# Patient Record
Sex: Male | Born: 1960
Health system: Southern US, Community
[De-identification: ages and names within clinical notes are randomized; demographics above are authoritative.]

## PROBLEM LIST (undated history)

## (undated) DIAGNOSIS — M545 Low back pain, unspecified: Secondary | ICD-10-CM

## (undated) DIAGNOSIS — J329 Chronic sinusitis, unspecified: Secondary | ICD-10-CM

## (undated) DIAGNOSIS — I4891 Unspecified atrial fibrillation: Secondary | ICD-10-CM

## (undated) DIAGNOSIS — K219 Gastro-esophageal reflux disease without esophagitis: Secondary | ICD-10-CM

## (undated) HISTORY — DX: Chronic sinusitis, unspecified: J32.9

## (undated) HISTORY — DX: Gastro-esophageal reflux disease without esophagitis: K21.9

## (undated) HISTORY — PX: EYE SURGERY: SHX253

## (undated) HISTORY — DX: Low back pain, unspecified: M54.50

## (undated) HISTORY — PX: CATARACT EXTRACTION: SUR2

## (undated) HISTORY — DX: Unspecified atrial fibrillation: I48.91

## (undated) HISTORY — DX: Low back pain: M54.5

---

## 1999-03-21 ENCOUNTER — Ambulatory Visit (HOSPITAL_BASED_OUTPATIENT_CLINIC_OR_DEPARTMENT_OTHER): Admission: RE | Admit: 1999-03-21 | Discharge: 1999-03-21 | Payer: Self-pay | Admitting: Orthopedic Surgery

## 2005-07-27 ENCOUNTER — Encounter: Admission: RE | Admit: 2005-07-27 | Discharge: 2005-07-27 | Payer: Self-pay | Admitting: Sports Medicine

## 2005-08-13 ENCOUNTER — Encounter: Admission: RE | Admit: 2005-08-13 | Discharge: 2005-08-13 | Payer: Self-pay | Admitting: Sports Medicine

## 2005-08-27 ENCOUNTER — Encounter: Admission: RE | Admit: 2005-08-27 | Discharge: 2005-08-27 | Payer: Self-pay | Admitting: Sports Medicine

## 2005-11-19 HISTORY — PX: BACK SURGERY: SHX140

## 2005-11-19 HISTORY — PX: LUMBAR DISC SURGERY: SHX700

## 2006-03-13 ENCOUNTER — Ambulatory Visit (HOSPITAL_COMMUNITY): Admission: RE | Admit: 2006-03-13 | Discharge: 2006-03-14 | Payer: Self-pay | Admitting: Neurosurgery

## 2006-09-27 ENCOUNTER — Ambulatory Visit (HOSPITAL_COMMUNITY): Admission: RE | Admit: 2006-09-27 | Discharge: 2006-09-27 | Payer: Self-pay | Admitting: Neurosurgery

## 2006-11-19 HISTORY — PX: NASAL SINUS SURGERY: SHX719

## 2007-03-21 ENCOUNTER — Ambulatory Visit: Payer: Self-pay | Admitting: Internal Medicine

## 2007-03-25 ENCOUNTER — Ambulatory Visit: Payer: Self-pay | Admitting: Internal Medicine

## 2007-03-25 LAB — CONVERTED CEMR LAB
Alkaline Phosphatase: 55 units/L (ref 39–117)
BUN: 16 mg/dL (ref 6–23)
CO2: 27 meq/L (ref 19–32)
Chloride: 108 meq/L (ref 96–112)
Creatinine, Ser: 1 mg/dL (ref 0.4–1.5)
Crystals: NEGATIVE
GFR calc Af Amer: 103 mL/min
GFR calc non Af Amer: 85 mL/min
Glucose, Bld: 96 mg/dL (ref 70–99)
Hemoglobin: 14.9 g/dL (ref 13.0–17.0)
Ketones, ur: NEGATIVE mg/dL
MCHC: 33.4 g/dL (ref 30.0–36.0)
Monocytes Relative: 7.7 % (ref 3.0–11.0)
Mucus, UA: NEGATIVE
Nitrite: NEGATIVE
Platelets: 231 10*3/uL (ref 150–400)
Potassium: 3.9 meq/L (ref 3.5–5.1)
RBC / HPF: NONE SEEN
Sed Rate: 10 mm/hr (ref 0–20)
Specific Gravity, Urine: >=1.03 (ref 1.000–1.03)
Total Protein: 6.6 g/dL (ref 6.0–8.3)

## 2007-04-22 ENCOUNTER — Ambulatory Visit: Payer: Self-pay | Admitting: Internal Medicine

## 2007-04-24 ENCOUNTER — Ambulatory Visit: Payer: Self-pay | Admitting: Cardiovascular Disease

## 2007-06-05 ENCOUNTER — Ambulatory Visit: Payer: Self-pay | Admitting: Internal Medicine

## 2007-06-18 ENCOUNTER — Ambulatory Visit: Payer: Self-pay | Admitting: Internal Medicine

## 2007-10-20 ENCOUNTER — Ambulatory Visit (HOSPITAL_BASED_OUTPATIENT_CLINIC_OR_DEPARTMENT_OTHER): Admission: RE | Admit: 2007-10-20 | Discharge: 2007-10-20 | Payer: Self-pay | Admitting: Otolaryngology

## 2007-10-20 ENCOUNTER — Encounter (INDEPENDENT_AMBULATORY_CARE_PROVIDER_SITE_OTHER): Payer: Self-pay | Admitting: Otolaryngology

## 2007-10-31 ENCOUNTER — Ambulatory Visit: Payer: Self-pay | Admitting: Internal Medicine

## 2007-10-31 DIAGNOSIS — M545 Low back pain, unspecified: Secondary | ICD-10-CM | POA: Insufficient documentation

## 2007-10-31 DIAGNOSIS — J309 Allergic rhinitis, unspecified: Secondary | ICD-10-CM | POA: Insufficient documentation

## 2007-10-31 DIAGNOSIS — J329 Chronic sinusitis, unspecified: Secondary | ICD-10-CM | POA: Insufficient documentation

## 2007-10-31 DIAGNOSIS — J301 Allergic rhinitis due to pollen: Secondary | ICD-10-CM | POA: Insufficient documentation

## 2008-04-29 ENCOUNTER — Ambulatory Visit: Payer: Self-pay | Admitting: Internal Medicine

## 2008-07-28 ENCOUNTER — Ambulatory Visit: Payer: Self-pay | Admitting: Internal Medicine

## 2008-11-26 ENCOUNTER — Encounter: Payer: Self-pay | Admitting: Internal Medicine

## 2008-12-07 ENCOUNTER — Ambulatory Visit: Payer: Self-pay | Admitting: Internal Medicine

## 2008-12-08 DIAGNOSIS — K219 Gastro-esophageal reflux disease without esophagitis: Secondary | ICD-10-CM | POA: Insufficient documentation

## 2009-09-01 ENCOUNTER — Ambulatory Visit: Payer: Self-pay | Admitting: Internal Medicine

## 2009-09-01 DIAGNOSIS — Z87891 Personal history of nicotine dependence: Secondary | ICD-10-CM | POA: Insufficient documentation

## 2009-09-01 DIAGNOSIS — M542 Cervicalgia: Secondary | ICD-10-CM | POA: Insufficient documentation

## 2010-01-14 ENCOUNTER — Ambulatory Visit: Payer: Self-pay | Admitting: Internal Medicine

## 2010-01-14 DIAGNOSIS — J069 Acute upper respiratory infection, unspecified: Secondary | ICD-10-CM | POA: Insufficient documentation

## 2010-01-14 DIAGNOSIS — J019 Acute sinusitis, unspecified: Secondary | ICD-10-CM | POA: Insufficient documentation

## 2010-02-10 ENCOUNTER — Ambulatory Visit: Payer: Self-pay | Admitting: Internal Medicine

## 2010-02-10 DIAGNOSIS — G5710 Meralgia paresthetica, unspecified lower limb: Secondary | ICD-10-CM | POA: Insufficient documentation

## 2010-03-23 ENCOUNTER — Ambulatory Visit: Payer: Self-pay | Admitting: Internal Medicine

## 2010-03-23 DIAGNOSIS — H9319 Tinnitus, unspecified ear: Secondary | ICD-10-CM | POA: Insufficient documentation

## 2010-04-28 ENCOUNTER — Ambulatory Visit: Payer: Self-pay | Admitting: Internal Medicine

## 2010-04-28 DIAGNOSIS — H6690 Otitis media, unspecified, unspecified ear: Secondary | ICD-10-CM | POA: Insufficient documentation

## 2010-10-25 ENCOUNTER — Ambulatory Visit: Payer: Self-pay | Admitting: Internal Medicine

## 2010-11-10 ENCOUNTER — Ambulatory Visit: Payer: Self-pay | Admitting: Internal Medicine

## 2010-11-10 DIAGNOSIS — R51 Headache: Secondary | ICD-10-CM | POA: Insufficient documentation

## 2010-11-10 DIAGNOSIS — R519 Headache, unspecified: Secondary | ICD-10-CM | POA: Insufficient documentation

## 2010-11-10 DIAGNOSIS — G43909 Migraine, unspecified, not intractable, without status migrainosus: Secondary | ICD-10-CM | POA: Insufficient documentation

## 2010-11-28 ENCOUNTER — Telehealth: Payer: Self-pay | Admitting: Internal Medicine

## 2010-11-29 ENCOUNTER — Ambulatory Visit
Admission: RE | Admit: 2010-11-29 | Discharge: 2010-11-29 | Payer: Self-pay | Source: Home / Self Care | Attending: Internal Medicine | Admitting: Internal Medicine

## 2010-11-29 ENCOUNTER — Other Ambulatory Visit: Payer: Self-pay | Admitting: Internal Medicine

## 2010-11-29 DIAGNOSIS — I889 Nonspecific lymphadenitis, unspecified: Secondary | ICD-10-CM | POA: Insufficient documentation

## 2010-11-29 LAB — CBC WITH DIFFERENTIAL/PLATELET
Basophils Absolute: 0.1 10*3/uL (ref 0.0–0.1)
Basophils Relative: 0.4 % (ref 0.0–3.0)
Eosinophils Absolute: 0.5 10*3/uL (ref 0.0–0.7)
Eosinophils Relative: 4 % (ref 0.0–5.0)
HCT: 47 % (ref 39.0–52.0)
Hemoglobin: 15.8 g/dL (ref 13.0–17.0)
Lymphocytes Relative: 24.6 % (ref 12.0–46.0)
Lymphs Abs: 3 10*3/uL (ref 0.7–4.0)
MCHC: 33.6 g/dL (ref 30.0–36.0)
MCV: 79 fl (ref 78.0–100.0)
Monocytes Absolute: 1.1 10*3/uL — ABNORMAL HIGH (ref 0.1–1.0)
Monocytes Relative: 9 % (ref 3.0–12.0)
Neutro Abs: 7.6 10*3/uL (ref 1.4–7.7)
Neutrophils Relative %: 62 % (ref 43.0–77.0)
Platelets: 263 10*3/uL (ref 150.0–400.0)
RBC: 5.95 Mil/uL — ABNORMAL HIGH (ref 4.22–5.81)
RDW: 14.5 % (ref 11.5–14.6)
WBC: 12.3 10*3/uL — ABNORMAL HIGH (ref 4.5–10.5)

## 2010-11-29 LAB — MONONUCLEOSIS SCREEN: Mono Screen: NEGATIVE

## 2010-11-29 LAB — SEDIMENTATION RATE: Sed Rate: 16 mm/hr (ref 0–22)

## 2010-12-08 ENCOUNTER — Ambulatory Visit
Admission: RE | Admit: 2010-12-08 | Discharge: 2010-12-08 | Payer: Self-pay | Source: Home / Self Care | Attending: Internal Medicine | Admitting: Internal Medicine

## 2010-12-19 NOTE — Assessment & Plan Note (Signed)
Summary: EAR ACHE W/PRESSURE-HEADACHE--STC   Vital Signs:  Patient profile:   50 year old male Height:      70 inches Weight:      216 pounds Temp:     97.6 degrees F oral Pulse rate:   88 / minute Resp:     24 per minute BP supine:   110 / 68  (left arm)  History of Present Illness: C/o L ear and L nose stopped up and hurt x 4 d  Allergies: 1)  * Seafood  Past History:  Past Medical History: Last updated: 03/23/2010 Sinusitis   Dr Pollyann Kennedy Allergic rhinitis Low back pain GERD Tinnitus L 2011  Social History: Last updated: 10/31/2007 Occupation: Academic librarian Married Former Smoker  Review of Systems       The patient complains of fever.  The patient denies chest pain, prolonged cough, and abdominal pain.    Physical Exam  Ears:  L TM red Nose:  no external deformity and no external erythema.  congested and clogged   tender frontal areas   mild face tenderness; no green d/c Mouth:  WNL Lungs:  Normal respiratory effort, chest expands symmetrically. Lungs are clear to auscultation, no crackles or wheezes.no dullness.   Heart:  Normal rate and regular rhythm. S1 and S2 normal without gallop, murmur, click, rub or other extra sounds.no lifts.   Abdomen:  Bowel sounds positive,abdomen soft and non-tender without masses, organomegaly or hernias noted.   Impression & Recommendations:  Problem # 1:  SINUSITIS - ACUTE-NOS (ICD-461.9) Assessment New  His updated medication list for this problem includes:    Fluticasone Propionate 50 Mcg/act Susp (Fluticasone propionate) .Marland Kitchen... 2 sprays each nostril once daily    Loratadine-pseudoephedrine 10-240 Mg Xr24h-tab (Loratadine-pseudoephedrine) .Marland Kitchen... 1 by mouth qam    Augmentin 875-125 Mg Tabs (Amoxicillin-pot clavulanate) .Marland Kitchen... 1 by mouth bid  Problem # 2:  OTITIS MEDIA (ICD-382.9) L Assessment: New  His updated medication list for this problem includes:    Nabumetone 500 Mg Tabs (Nabumetone) .Marland Kitchen... 1 two times a day as needed  Augmentin 875-125 Mg Tabs (Amoxicillin-pot clavulanate) .Marland Kitchen... 1 by mouth bid  Complete Medication List: 1)  Fluticasone Propionate 50 Mcg/act Susp (Fluticasone propionate) .... 2 sprays each nostril once daily 2)  Vitamin D3 1000 Unit Tabs (Cholecalciferol) .Marland Kitchen.. 1 qd 3)  Nabumetone 500 Mg Tabs (Nabumetone) .Marland Kitchen.. 1 two times a day as needed 4)  Ranitidine Hcl 300 Mg Tabs (Ranitidine hcl) .Marland Kitchen.. 1 po qd 5)  Metamucil 0.52 Gm Caps (Psyllium) .... 2 caps a day 6)  Loratadine-pseudoephedrine 10-240 Mg Xr24h-tab (Loratadine-pseudoephedrine) .Marland Kitchen.. 1 by mouth qam 7)  Hydroxyzine Hcl 25 Mg Tabs (Hydroxyzine hcl) .Marland Kitchen.. 1-2 tabs by mouth two times a day as needed for ringing in the ears 8)  Augmentin 875-125 Mg Tabs (Amoxicillin-pot clavulanate) .Marland Kitchen.. 1 by mouth bid 9)  Prednisone 10 Mg Tabs (Prednisone) .... Take 40mg  qd for 3 days, then 20 mg qd for 3 days, then 10mg  qd for 6 days, then stop. take pc.  Patient Instructions: 1)  Call if you are not better in a reasonable amount of time or if worse.  Prescriptions: PREDNISONE 10 MG TABS (PREDNISONE) Take 40mg  qd for 3 days, then 20 mg qd for 3 days, then 10mg  qd for 6 days, then stop. Take pc.  #24 x 1   Entered and Authorized by:   Tresa Garter MD   Signed by:   Tresa Garter MD on 04/28/2010   Method used:  Print then Give to Patient   RxID:   [id number] AUGMENTIN 875-125 MG TABS (AMOXICILLIN-POT CLAVULANATE) 1 by mouth bid  #20 x 1   Entered and Authorized by:   Tresa Garter MD   Signed by:   Tresa Garter MD on 04/28/2010   Method used:   Print then Give to Patient   RxID:   [id number]

## 2010-12-19 NOTE — Assessment & Plan Note (Signed)
Summary: 3 MO ROV /NWS #  RS'D PER PT/NWS   Vital Signs:  Patient profile:   50 year old male Weight:      214 pounds Temp:     97.4 degrees F oral Pulse rate:   69 / minute BP sitting:   90 / 70  (left arm)  Vitals Entered By: Tora Perches (February 10, 2010 9:20 AM) CC: f/u Is Patient Diabetic? No   CC:  f/u.  History of Present Illness: The patient presents with complaints of sneezing, cough, sinus congestion and drainge of several wks duration. Not better with OTC meds. C/o L groin and anterior thigh discomfort  x wks.  Preventive Screening-Counseling & Management  Alcohol-Tobacco     Smoking Status: quit  Current Medications (verified): 1)  Fluticasone Propionate 50 Mcg/act  Susp (Fluticasone Propionate) .... 2 Sprays Each Nostril Once Daily 2)  Fexofenadine Hcl 180 Mg Tabs (Fexofenadine Hcl) .Marland Kitchen.. 1 Once Daily As Needed Allergies 3)  Vitamin D3 1000 Unit  Tabs (Cholecalciferol) .Marland Kitchen.. 1 Qd 4)  Nabumetone 500 Mg Tabs (Nabumetone) .Marland Kitchen.. 1 Two Times A Day As Needed 5)  Ranitidine Hcl 300 Mg Tabs (Ranitidine Hcl) .Marland Kitchen.. 1 Po Qd 6)  Metamucil 0.52 Gm Caps (Psyllium) .... 2 Caps A Day  Allergies: 1)  * Seafood  Past History:  Past Medical History: Last updated: 12/07/2008 Sinusitis   Dr Pollyann Kennedy Allergic rhinitis Low back pain GERD  Social History: Last updated: 10/31/2007 Occupation: Academic librarian Married Former Smoker  Review of Systems       The patient complains of fever.  The patient denies dyspnea on exertion and abdominal pain.         LBP better  Physical Exam  General:  Well-developed,well-nourished,in no acute distress; alert,appropriate and cooperative throughout examination mild congestion  speaks fairly  good english   Ears:  R ear normal, L ear normal, and no external deformities.   Nose:  no external deformity and no external erythema.  congested and clogged   tender frontal areas   mild face tenderness; no green d/c Mouth:  WNL Neck:  No deformities,  masses, or tenderness noted. Lungs:  Normal respiratory effort, chest expands symmetrically. Lungs are clear to auscultation, no crackles or wheezes.no dullness.   Heart:  Normal rate and regular rhythm. S1 and S2 normal without gallop, murmur, click, rub or other extra sounds.no lifts.   Abdomen:  Bowel sounds positive,abdomen soft and non-tender without masses, organomegaly or hernias noted. Msk:  L anterior thigh w/oval area of decreased sensation Neurologic:  grossly normal  gait nl  non focal Skin:  turgor normal, color normal, and no ecchymoses.  no acute rashes   Psych:  Oriented X3, good eye contact, and not anxious appearing.     Impression & Recommendations:  Problem # 1:  ALLERGIC RHINITIS (ICD-477.9) Assessment Deteriorated  The following medications were removed from the medication list:    Fexofenadine Hcl 180 Mg Tabs (Fexofenadine hcl) .Marland Kitchen... 1 once daily as needed allergies His updated medication list for this problem includes:    Fluticasone Propionate 50 Mcg/act Susp (Fluticasone propionate) .Marland Kitchen... 2 sprays each nostril once daily  Orders: Depo- Medrol 40mg  (J1030) Depo- Medrol 80mg  (J1040) Admin of Therapeutic Inj  intramuscular or subcutaneous (16109)  Problem # 2:  LOW BACK PAIN (ICD-724.2) Assessment: Improved  His updated medication list for this problem includes:    Nabumetone 500 Mg Tabs (Nabumetone) .Marland Kitchen... 1 two times a day as needed  Problem # 3:  MERALGIA PARESTHETICA (ICD-355.1) L Assessment: New loosen the belt  Problem # 4:  SINUSITIS - ACUTE-NOS (ICD-461.9) Assessment: Improved  His updated medication list for this problem includes:    Fluticasone Propionate 50 Mcg/act Susp (Fluticasone propionate) .Marland Kitchen... 2 sprays each nostril once daily    Loratadine-pseudoephedrine 10-240 Mg Xr24h-tab (Loratadine-pseudoephedrine) .Marland Kitchen... 1 by mouth qam  Complete Medication List: 1)  Fluticasone Propionate 50 Mcg/act Susp (Fluticasone propionate) .... 2 sprays  each nostril once daily 2)  Vitamin D3 1000 Unit Tabs (Cholecalciferol) .Marland Kitchen.. 1 qd 3)  Nabumetone 500 Mg Tabs (Nabumetone) .Marland Kitchen.. 1 two times a day as needed 4)  Ranitidine Hcl 300 Mg Tabs (Ranitidine hcl) .Marland Kitchen.. 1 po qd 5)  Metamucil 0.52 Gm Caps (Psyllium) .... 2 caps a day 6)  Loratadine-pseudoephedrine 10-240 Mg Xr24h-tab (Loratadine-pseudoephedrine) .Marland Kitchen.. 1 by mouth qam 7)  Prednisone 10 Mg Tabs (Prednisone) .... Take 40mg  qd for 3 days, then 20 mg qd for 3 days, then 10mg  qd for 6 days, then stop. take pc.  Patient Instructions: 1)  Please schedule a follow-up appointment in 1 month. 2)  Loosen the belt 3)  Use the Sinus rinse as needed  Prescriptions: RANITIDINE HCL 300 MG TABS (RANITIDINE HCL) 1 po qd  #90 x 3   Entered and Authorized by:   Tresa Garter MD   Signed by:   Tresa Garter MD on 02/10/2010   Method used:   Print then Give to Patient   RxID:   [id number] NABUMETONE 500 MG TABS (NABUMETONE) 1 two times a day as needed  #60 x 6   Entered and Authorized by:   Tresa Garter MD   Signed by:   Tresa Garter MD on 02/10/2010   Method used:   Print then Give to Patient   RxID:   [id number] VITAMIN D3 1000 UNIT  TABS (CHOLECALCIFEROL) 1 qd  #100 x 3   Entered and Authorized by:   Tresa Garter MD   Signed by:   Tresa Garter MD on 02/10/2010   Method used:   Print then Give to Patient   RxID:   [id number] FLUTICASONE PROPIONATE 50 MCG/ACT  SUSP (FLUTICASONE PROPIONATE) 2 sprays each nostril once daily  #1 x 12   Entered and Authorized by:   Tresa Garter MD   Signed by:   Tresa Garter MD on 02/10/2010   Method used:   Print then Give to Patient   RxID:   [id number] PREDNISONE 10 MG TABS (PREDNISONE) Take 40mg  qd for 3 days, then 20 mg qd for 3 days, then 10mg  qd for 6 days, then stop. Take pc.  #24 x 1   Entered and Authorized by:   Tresa Garter MD   Signed by:   Tresa Garter MD on  02/10/2010   Method used:   Print then Give to Patient   RxID:   [id number] LORATADINE-PSEUDOEPHEDRINE 10-240 MG XR24H-TAB (LORATADINE-PSEUDOEPHEDRINE) 1 by mouth qam  #30 x 12   Entered and Authorized by:   Tresa Garter MD   Signed by:   Tresa Garter MD on 02/10/2010   Method used:   Print then Give to Patient   RxID:   [id number]    Medication Administration  Injection # 1:    Medication: Depo- Medrol 40mg     Diagnosis: ALLERGIC RHINITIS (ICD-477.9)    Route: IM    Site: LUOQ gluteus    Exp Date: 09/2012    Lot #:  [id number]    Mfr: [employer]    Patient tolerated injection without complications    Given by: Tora Perches (February 10, 2010 10:04 AM)  Injection # 2:    Medication: Depo- Medrol 80mg     Diagnosis: ALLERGIC RHINITIS (ICD-477.9)    Route: IM    Site: LUOQ gluteus    Exp Date: 09/2012    Lot #: [id number]    Mfr: [employer]    Patient tolerated injection without complications    Given by: Tora Perches (February 10, 2010 10:05 AM)  Orders Added: 1)  Est. Patient Level IV [29562] 2)  Depo- Medrol 40mg  [J1030] 3)  Depo- Medrol 80mg  [J1040] 4)  Admin of Therapeutic Inj  intramuscular or subcutaneous [13086]

## 2010-12-19 NOTE — Assessment & Plan Note (Signed)
Summary: 1 MO ROV /NWS  #   Vital Signs:  Patient profile:   50 year old male Height:      70 inches Weight:      212.38 pounds BMI:     30.58 O2 Sat:      96 % on Room air Temp:     97.4 degrees F oral Pulse rate:   63 / minute BP sitting:   100 / 60  (left arm) Cuff size:   large  Vitals Entered By: Lucious Groves (Mar 23, 2010 9:13 AM)  O2 Flow:  Room air CC: 1 mo rtn ov./kb Is Patient Diabetic? No Pain Assessment Patient in pain? no        CC:  1 mo rtn ov./kb.  History of Present Illness: The patient presents for a follow up of sinusitis - better. F/u LBP, L thigh numbness - better. C/o tinnitus at times; not dizzy...   Current Medications (verified): 1)  Fluticasone Propionate 50 Mcg/act  Susp (Fluticasone Propionate) .... 2 Sprays Each Nostril Once Daily 2)  Vitamin D3 1000 Unit  Tabs (Cholecalciferol) .Marland Kitchen.. 1 Qd 3)  Nabumetone 500 Mg Tabs (Nabumetone) .Marland Kitchen.. 1 Two Times A Day As Needed 4)  Ranitidine Hcl 300 Mg Tabs (Ranitidine Hcl) .Marland Kitchen.. 1 Po Qd 5)  Metamucil 0.52 Gm Caps (Psyllium) .... 2 Caps A Day 6)  Loratadine-Pseudoephedrine 10-240 Mg Xr24h-Tab (Loratadine-Pseudoephedrine) .Marland Kitchen.. 1 By Mouth Qam 7)  Prednisone 10 Mg Tabs (Prednisone) .... Take 40mg  Qd For 3 Days, Then 20 Mg Qd For 3 Days, Then 10mg  Qd For 6 Days, Then Stop. Take Pc.  Allergies (verified): 1)  * Seafood  Past History:  Social History: Last updated: 10/31/2007 Occupation: Academic librarian Married Former Smoker  Past Medical History: Sinusitis   Dr Pollyann Kennedy Allergic rhinitis Low back pain GERD Tinnitus L 2011  Family History: Reviewed history from 10/31/2007 and no changes required. Unremarkable  Social History: Reviewed history from 10/31/2007 and no changes required. Occupation: Academic librarian Married Former Smoker  Physical Exam  General:  Well-developed,well-nourished,in no acute distress; alert,appropriate and cooperative throughout examination mild congestion  speaks fairly  good english    Ears:  R ear normal, L ear normal, and no external deformities.   Nose:  no external deformity and no external erythema.  congested and clogged   tender frontal areas   mild face tenderness; no green d/c Mouth:  WNL Lungs:  Normal respiratory effort, chest expands symmetrically. Lungs are clear to auscultation, no crackles or wheezes.no dullness.   Heart:  Normal rate and regular rhythm. S1 and S2 normal without gallop, murmur, click, rub or other extra sounds.no lifts.   Abdomen:  Bowel sounds positive,abdomen soft and non-tender without masses, organomegaly or hernias noted. Msk:  L anterior thigh w/oval area of decreased sensation resolved Neurologic:  grossly normal  gait nl  non focal Skin:  turgor normal, color normal, and no ecchymoses.  no acute rashes   Psych:  Oriented X3, good eye contact, and not anxious appearing.     Impression & Recommendations:  Problem # 1:  TINNITUS (ICD-388.30) L Assessment New Hydroxyzine Refused ENT consult Hold Relafen  Problem # 2:  MERALGIA PARESTHETICA (ICD-355.1) Assessment: Improved Resolving  Problem # 3:  LOW BACK PAIN (ICD-724.2) Assessment: Improved  His updated medication list for this problem includes:    Nabumetone 500 Mg Tabs (Nabumetone) .Marland Kitchen... 1 two times a day as needed  Problem # 4:  SINUSITIS, CHRONIC (ICD-473.9) Assessment: Improved  His updated  medication list for this problem includes:    Fluticasone Propionate 50 Mcg/act Susp (Fluticasone propionate) .Marland Kitchen... 2 sprays each nostril once daily    Loratadine-pseudoephedrine 10-240 Mg Xr24h-tab (Loratadine-pseudoephedrine) .Marland Kitchen... 1 by mouth qam  Complete Medication List: 1)  Fluticasone Propionate 50 Mcg/act Susp (Fluticasone propionate) .... 2 sprays each nostril once daily 2)  Vitamin D3 1000 Unit Tabs (Cholecalciferol) .Marland Kitchen.. 1 qd 3)  Nabumetone 500 Mg Tabs (Nabumetone) .Marland Kitchen.. 1 two times a day as needed 4)  Ranitidine Hcl 300 Mg Tabs (Ranitidine hcl) .Marland Kitchen.. 1 po qd 5)   Metamucil 0.52 Gm Caps (Psyllium) .... 2 caps a day 6)  Loratadine-pseudoephedrine 10-240 Mg Xr24h-tab (Loratadine-pseudoephedrine) .Marland Kitchen.. 1 by mouth qam 7)  Hydroxyzine Hcl 25 Mg Tabs (Hydroxyzine hcl) .Marland Kitchen.. 1-2 tabs by mouth two times a day as needed for ringing in the ears  Patient Instructions: 1)  Loose 10 lbs. Stretch! 2)  Do not take Nabumetone x 7-10 days to see if ringing in the ear is better 3)  Please schedule a follow-up appointment in 4 months. Prescriptions: HYDROXYZINE HCL 25 MG TABS (HYDROXYZINE HCL) 1-2 tabs by mouth two times a day as needed for ringing in the ears  #60 x 3   Entered and Authorized by:   Tresa Garter MD   Signed by:   Tresa Garter MD on 03/23/2010   Method used:   Print then Give to Patient   RxID:   [id number]

## 2010-12-19 NOTE — Assessment & Plan Note (Signed)
Summary: 4 mos f/u //#/cd   RS'D PER PT/NWS   Vital Signs:  Patient profile:   50 year old male Height:      70 inches Weight:      211 pounds BMI:     30.38 Temp:     98.0 degrees F oral Pulse rate:   76 / minute Pulse rhythm:   regular Resp:     16 per minute BP sitting:   108 / 80  (left arm) Cuff size:   regular  Vitals Entered By: Lanier Prude, CMA(AAMA) (October 25, 2010 9:20 AM) CC: 4 mo f/u c/o dizziness, bilateral ear pressure, HA Is Patient Diabetic? No Comments pt states he hears buzzing sound   CC:  4 mo f/u c/o dizziness, bilateral ear pressure, and HA.  History of Present Illness: The patient presents with complaints of sore throat, fever,  sinus congestion and drainge of several days duration. Not better with OTC meds.Ears are popping.  The mucus is colored.   Current Medications (verified): 1)  Fluticasone Propionate 50 Mcg/act  Susp (Fluticasone Propionate) .... 2 Sprays Each Nostril Once Daily 2)  Vitamin D3 1000 Unit  Tabs (Cholecalciferol) .Marland Kitchen.. 1 Qd 3)  Nabumetone 500 Mg Tabs (Nabumetone) .Marland Kitchen.. 1 Two Times A Day As Needed 4)  Ranitidine Hcl 300 Mg Tabs (Ranitidine Hcl) .Marland Kitchen.. 1 Po Qd 5)  Metamucil 0.52 Gm Caps (Psyllium) .... 2 Caps A Day 6)  Loratadine-Pseudoephedrine 10-240 Mg Xr24h-Tab (Loratadine-Pseudoephedrine) .Marland Kitchen.. 1 By Mouth Qam 7)  Hydroxyzine Hcl 25 Mg Tabs (Hydroxyzine Hcl) .Marland Kitchen.. 1-2 Tabs By Mouth Two Times A Day As Needed For Ringing in The Ears 8)  Prednisone 10 Mg Tabs (Prednisone) .... Take 40mg  Qd For 3 Days, Then 20 Mg Qd For 3 Days, Then 10mg  Qd For 6 Days, Then Stop. Take Pc.  Allergies (verified): 1)  * Seafood  Physical Exam  General:  Well-developed,well-nourished,in no acute distress; alert,appropriate and cooperative throughout examination mild congestion  speaks fairly  good english   Head:  normocephalic and atraumatic.   Ears:  B TM  are red Nose:  no external deformity and no external erythema.  congested and clogged   tender  frontal areas   mild face tenderness; no green d/c Mouth:  WNL Neck:  No deformities, masses, or tenderness noted. Lungs:  Normal respiratory effort, chest expands symmetrically. Lungs are clear to auscultation, no crackles or wheezes.no dullness.   Heart:  Normal rate and regular rhythm. S1 and S2 normal without gallop, murmur, click, rub or other extra sounds.no lifts.   Abdomen:  Bowel sounds positive,abdomen soft and non-tender without masses, organomegaly or hernias noted. Msk:  WNL Neurologic:  grossly normal  gait nl  non focal Skin:  turgor normal, color normal, and no ecchymoses.  no acute rashes   Psych:  Oriented X3, good eye contact, and not anxious appearing.     Impression & Recommendations:  Problem # 1:  OTITIS MEDIA (ICD-382.9) Assessment New  Problem # 2:  SINUSITIS - ACUTE-NOS (ICD-461.9) Assessment: New  His updated medication list for this problem includes:    Fluticasone Propionate 50 Mcg/act Susp (Fluticasone propionate) .Marland Kitchen... 2 sprays each nostril once daily    Loratadine-pseudoephedrine 10-240 Mg Xr24h-tab (Loratadine-pseudoephedrine) .Marland Kitchen... 1 by mouth qam    Augmentin 875-125 Mg Tabs (Amoxicillin-pot clavulanate) .Marland Kitchen... 1 by mouth bid  Complete Medication List: 1)  Fluticasone Propionate 50 Mcg/act Susp (Fluticasone propionate) .... 2 sprays each nostril once daily 2)  Vitamin D3 1000  Unit Tabs (Cholecalciferol) .Marland Kitchen.. 1 qd 3)  Nabumetone 500 Mg Tabs (Nabumetone) .Marland Kitchen.. 1 two times a day as needed 4)  Ranitidine Hcl 300 Mg Tabs (Ranitidine hcl) .Marland Kitchen.. 1 po qd 5)  Metamucil 0.52 Gm Caps (Psyllium) .... 2 caps a day 6)  Loratadine-pseudoephedrine 10-240 Mg Xr24h-tab (Loratadine-pseudoephedrine) .Marland Kitchen.. 1 by mouth qam 7)  Hydroxyzine Hcl 25 Mg Tabs (Hydroxyzine hcl) .Marland Kitchen.. 1-2 tabs by mouth two times a day as needed for ringing in the ears 8)  Prednisone 10 Mg Tabs (Prednisone) .... Take 40mg  qd for 3 days, then 20 mg qd for 3 days, then 10mg  qd for 6 days, then stop. take  pc. 9)  Augmentin 875-125 Mg Tabs (Amoxicillin-pot clavulanate) .Marland Kitchen.. 1 by mouth bid  Patient Instructions: 1)  Call if you are not better in a reasonable amount of time or if worse.  Prescriptions: PREDNISONE 10 MG TABS (PREDNISONE) Take 40mg  qd for 3 days, then 20 mg qd for 3 days, then 10mg  qd for 6 days, then stop. Take pc.  #24 x 1   Entered and Authorized by:   Tresa Garter MD   Signed by:   Tresa Garter MD on 10/25/2010   Method used:   Electronically to        [facility]* (retail)       [address]       [place], [place]  [id number]       Ph: [phone]       Fax: [phone]   RxID:   [id number] AUGMENTIN 875-125 MG TABS (AMOXICILLIN-POT CLAVULANATE) 1 by mouth bid  #20 x 0   Entered and Authorized by:   Tresa Garter MD   Signed by:   Tresa Garter MD on 10/25/2010   Method used:   Electronically to        [facility]* (retail)       [address]       [place], [place]  [id number]       Ph: [phone]       Fax: [phone]   RxID:   [id number]    Orders Added: 1)  Est. Patient Level III [30160]

## 2010-12-19 NOTE — Assessment & Plan Note (Signed)
Summary: DR AVP PT-DIZZY-HEAT IN MOUTH-STC   Vital Signs:  Patient profile:   50 year old male Weight:      212 pounds O2 Sat:      96 % on Room air Temp:     98.1 degrees F Pulse rate:   61 / minute BP sitting:   108 / 68  (right arm) Cuff size:   large  Vitals Entered By: Lamar Sprinkles, CMA (January 14, 2010 9:39 AM)  O2 Flow:  Room air CC: Pt brought in list of symptoms that someone helped him write: Sores in mouth, h/a, tired, dizzy, nasal drainage, ears feel clogged, pain in joints    History of Present Illness: Joe Hawkins comes  in today for  Sat clinic   fo above symptom . days of above signs  but no fever some achiness.  HA over frontal area and nose congestion. NO cp of sob. some dizziness and ringing in right ear. No falling  focal weakness or numbness.  Hs of recurrent sinsusitis and sinus surgery.  ? if tried medication and is on his flonase .   Preventive Screening-Counseling & Management  Alcohol-Tobacco     Smoking Status: quit  Current Medications (verified): 1)  Fluticasone Propionate 50 Mcg/act  Susp (Fluticasone Propionate) .... 2 Sprays Each Nostril Once Daily 2)  Fexofenadine Hcl 180 Mg Tabs (Fexofenadine Hcl) .Marland Kitchen.. 1 Once Daily As Needed Allergies 3)  Vitamin D3 1000 Unit  Tabs (Cholecalciferol) .Marland Kitchen.. 1 Qd 4)  Nabumetone 500 Mg Tabs (Nabumetone) .Marland Kitchen.. 1 Two Times A Day As Needed 5)  Ranitidine Hcl 300 Mg Tabs (Ranitidine Hcl) .Marland Kitchen.. 1 Po Qd 6)  Metamucil 0.52 Gm Caps (Psyllium) .... 2 Caps A Day  Allergies (verified): 1)  * Seafood  Past History:  Past medical, surgical, family and social histories (including risk factors) reviewed for relevance to current acute and chronic problems.  Past Medical History: Reviewed history from 12/07/2008 and no changes required. Sinusitis   Dr Pollyann Kennedy Allergic rhinitis Low back pain GERD  Past Surgical History: Reviewed history from 10/31/2007 and no changes required. Sinus surgery 2008  Family  History: Reviewed history from 10/31/2007 and no changes required. Unremarkable  Social History: Reviewed history from 10/31/2007 and no changes required. Occupation: Academic librarian Married Former Smoker  Review of Systems       The patient complains of fever and headaches.  The patient denies anorexia, chest pain, syncope, dyspnea on exertion, peripheral edema, prolonged cough, hemoptysis, abdominal pain, abnormal bleeding, enlarged lymph nodes, and angioedema.    Physical Exam  General:  Well-developed,well-nourished,in no acute distress; alert,appropriate and cooperative throughout examination mild congestion  speaks fairly  good english   Head:  normocephalic and atraumatic.   Eyes:  vision grossly intact, pupils equal, and pupils round.   no redness or diacharge Ears:  R ear normal, L ear normal, and no external deformities.   Nose:  no external deformity and no external erythema.  congested and clogged   tender frontal areas   midl face tenderness Mouth:  milderythema no lesions Neck:  No deformities, masses, or tenderness noted. Lungs:  Normal respiratory effort, chest expands symmetrically. Lungs are clear to auscultation, no crackles or wheezes.no dullness.   Heart:  Normal rate and regular rhythm. S1 and S2 normal without gallop, murmur, click, rub or other extra sounds.no lifts.   Pulses:  nl cap refill  Neurologic:  grossly normal  gait nl  non focal Skin:  turgor normal, color normal, and  no ecchymoses.  no acute rashes   Cervical Nodes:  No lymphadenopathy noted Psych:  Oriented X3, good eye contact, and not anxious appearing.     Impression & Recommendations:  Problem # 1:  URI (ICD-465.9) onset seem viral but has sig sinus pressure signs and dizzy feeling.   NO alarm features in hx of exam otherwise. His updated medication list for this problem includes:    Fexofenadine Hcl 180 Mg Tabs (Fexofenadine hcl) .Marland Kitchen... 1 once daily as needed allergies    Nabumetone 500 Mg Tabs  (Nabumetone) .Marland Kitchen... 1 two times a day as needed  Problem # 2:  SINUSITIS - ACUTE-NOS (ICD-461.9)   ? acute on chronic ?    His updated medication list for this problem includes:    Fluticasone Propionate 50 Mcg/act Susp (Fluticasone propionate) .Marland Kitchen... 2 sprays each nostril once daily    Amoxicillin-pot Clavulanate 875-125 Mg Tabs (Amoxicillin-pot clavulanate)  Problem # 3:  SINUSITIS, CHRONIC (ICD-473.9)  His updated medication list for this problem includes:    Fluticasone Propionate 50 Mcg/act Susp (Fluticasone propionate) .Marland Kitchen... 2 sprays each nostril once daily    Amoxicillin-pot Clavulanate 875-125 Mg Tabs (Amoxicillin-pot clavulanate)  Problem # 4:  ALLERGIC RHINITIS (ICD-477.9)  His updated medication list for this problem includes:    Fluticasone Propionate 50 Mcg/act Susp (Fluticasone propionate) .Marland Kitchen... 2 sprays each nostril once daily    Fexofenadine Hcl 180 Mg Tabs (Fexofenadine hcl) .Marland Kitchen... 1 once daily as needed allergies  Complete Medication List: 1)  Fluticasone Propionate 50 Mcg/act Susp (Fluticasone propionate) .... 2 sprays each nostril once daily 2)  Fexofenadine Hcl 180 Mg Tabs (Fexofenadine hcl) .Marland Kitchen.. 1 once daily as needed allergies 3)  Vitamin D3 1000 Unit Tabs (Cholecalciferol) .Marland Kitchen.. 1 qd 4)  Nabumetone 500 Mg Tabs (Nabumetone) .Marland Kitchen.. 1 two times a day as needed 5)  Ranitidine Hcl 300 Mg Tabs (Ranitidine hcl) .Marland Kitchen.. 1 po qd 6)  Metamucil 0.52 Gm Caps (Psyllium) .... 2 caps a day 7)  Amoxicillin-pot Clavulanate 875-125 Mg Tabs (Amoxicillin-pot clavulanate)  Patient Instructions: 1)  I think you have a viral respiratory infection which should resolve on its own in another 5 days or so. 2)  However you also have a sinusitis  3)  Acute sinusitis symptoms for less than 10 days are not helped by antibiotics. Use warm moist compresses, and over the counter decongestants( only as directed).  if pain not improving in 2-3 days can add antibiotic to help.  4)  call if fever over  100.3 or shortness of breath. Prescriptions: AMOXICILLIN-POT CLAVULANATE 875-125 MG TABS (AMOXICILLIN-POT CLAVULANATE)   #20 x 0   Entered and Authorized by:   Madelin Headings MD   Signed by:   Madelin Headings MD on 01/14/2010   Method used:   Print then Give to Patient   RxID:   [id number]

## 2010-12-21 NOTE — Assessment & Plan Note (Signed)
Summary: sinus congestion/Sd   Vital Signs:  Patient profile:   50 year old male Height:      70 inches Weight:      214 pounds BMI:     30.82 Temp:     97.9 degrees F oral Pulse rate:   64 / minute Pulse rhythm:   regular Resp:     16 per minute BP sitting:   120 / 80  (left arm) Cuff size:   regular  Vitals Entered By: Lanier Prude, CMA(AAMA) (November 29, 2010 1:37 PM) CC: pain in Rt side jaw/neck, has "knot there"X 3 days, Rt ear pain, trouble swallowing Is Patient Diabetic? No   CC:  pain in Rt side jaw/neck, has "knot there"X 3 days, Rt ear pain, and trouble swallowing.  History of Present Illness: C/o painful swelling under R jaw x 3 day, fever - he could not sleep. It was hard to swallow  Current Medications (verified): 1)  Fluticasone Propionate 50 Mcg/act  Susp (Fluticasone Propionate) .... 2 Sprays Each Nostril Once Daily 2)  Vitamin D3 1000 Unit  Tabs (Cholecalciferol) .Marland Kitchen.. 1 Qd 3)  Nabumetone 500 Mg Tabs (Nabumetone) .Marland Kitchen.. 1 Two Times A Day As Needed 4)  Ranitidine Hcl 300 Mg Tabs (Ranitidine Hcl) .Marland Kitchen.. 1 Po Qd 5)  Metamucil 0.52 Gm Caps (Psyllium) .... 2 Caps A Day 6)  Loratadine-Pseudoephedrine 10-240 Mg Xr24h-Tab (Loratadine-Pseudoephedrine) .Marland Kitchen.. 1 By Mouth Qam 7)  Hydroxyzine Hcl 25 Mg Tabs (Hydroxyzine Hcl) .Marland Kitchen.. 1-2 Tabs By Mouth Two Times A Day As Needed For Ringing in The Ears 8)  Prednisone 10 Mg Tabs (Prednisone) .... Take 40mg  Qd For 3 Days, Then 20 Mg Qd For 3 Days, Then 10mg  Qd For 6 Days, Then Stop. Take Pc. 9)  Hydrocodone-Acetaminophen 5-325 Mg Tabs (Hydrocodone-Acetaminophen) .Marland Kitchen.. 1-2 By Mouth Two Times A Day As Needed Pain 10)  Maxalt-Mlt 10 Mg Tbdp (Rizatriptan Benzoate) .Marland Kitchen.. 1 By Mouth Once Daily As Needed Migraine  Allergies (verified): 1)  * Seafood  Past History:  Past Medical History: Last updated: 03/23/2010 Sinusitis   Dr Pollyann Kennedy Allergic rhinitis Low back pain GERD Tinnitus L 2011  Social History: Last updated:  10/31/2007 Occupation: Academic librarian Married Former Smoker  Review of Systems       The patient complains of fever.  The patient denies chest pain, dyspnea on exertion, prolonged cough, hemoptysis, and severe indigestion/heartburn.    Physical Exam  General:  Well-developed,overweight-appearing.   Ears:  External ear exam shows no significant lesions or deformities.  Otoscopic examination reveals clear canals, tympanic membranes are intact bilaterally without bulging, retraction, inflammation or discharge. Hearing is grossly normal bilaterally. Nose:  External nasal examination shows no deformity or inflammation. Nasal mucosa are pink and moist without lesions or exudates. Mouth:  Oral mucosa and oropharynx without lesions or exudates.  Teeth in good repair. Neck:  Large tender LN at  R jaw angle  Lungs:  Normal respiratory effort, chest expands symmetrically. Lungs are clear to auscultation, no crackles or wheezes.no dullness.   Heart:  Normal rate and regular rhythm. S1 and S2 normal without gallop, murmur, click, rub or other extra sounds.no lifts.   Abdomen:  Bowel sounds positive,abdomen soft and non-tender without masses, organomegaly or hernias noted. Skin:  Intact without suspicious lesions or rashes Psych:  Oriented X3, good eye contact, and not anxious appearing.     Impression & Recommendations:  Problem # 1:  LYMPHADENITIS (ICD-289.3) Assessment New Clinical bedside ultrasound was performed  Orders: Rocephin  250mg  (  Y8657) See Meds See "Patient Instructions".  Admin of Therapeutic Inj  intramuscular or subcutaneous (84696) TLB-CBC Platelet - w/Differential (85025-CBCD) TLB-Sedimentation Rate (ESR) (85652-ESR) TLB-Mono Test (Monospot) (29528-UXLK)  Sonography Report: Clinical bedside ultrasound was performed using Terason 3000 machine with a linear transducer.  A diffusely enlarged LN was observed in the area of R jaw angle. It was larger than transducer field. Images  saved  Complete Medication List: 1)  Fluticasone Propionate 50 Mcg/act Susp (Fluticasone propionate) .... 2 sprays each nostril once daily 2)  Vitamin D3 1000 Unit Tabs (Cholecalciferol) .Marland Kitchen.. 1 qd 3)  Nabumetone 500 Mg Tabs (Nabumetone) .Marland Kitchen.. 1 two times a day as needed 4)  Ranitidine Hcl 300 Mg Tabs (Ranitidine hcl) .Marland Kitchen.. 1 po qd 5)  Metamucil 0.52 Gm Caps (Psyllium) .... 2 caps a day 6)  Loratadine-pseudoephedrine 10-240 Mg Xr24h-tab (Loratadine-pseudoephedrine) .Marland Kitchen.. 1 by mouth qam 7)  Hydroxyzine Hcl 25 Mg Tabs (Hydroxyzine hcl) .Marland Kitchen.. 1-2 tabs by mouth two times a day as needed for ringing in the ears 8)  Prednisone 10 Mg Tabs (Prednisone) .... Take 40mg  qd for 3 days, then 20 mg qd for 3 days, then 10mg  qd for 6 days, then stop. take pc. 9)  Hydrocodone-acetaminophen 5-325 Mg Tabs (Hydrocodone-acetaminophen) .Marland Kitchen.. 1-2 by mouth two times a day as needed pain 10)  Maxalt-mlt 10 Mg Tbdp (Rizatriptan benzoate) .Marland Kitchen.. 1 by mouth once daily as needed migraine 11)  Augmentin 875-125 Mg Tabs (Amoxicillin-pot clavulanate) .Marland Kitchen.. 1 by mouth bid  Other Orders: EMR Misc Charge Code [facility])  Patient Instructions: 1)  Take Prednisone 40 mg today, 20 mg tomorrow and 10 mg on Fri 2)  Call if you are not better in a reasonable amount of time or if worse. Go to ER if feeling really bad!  Prescriptions: PREDNISONE 10 MG TABS (PREDNISONE) Take 40mg  qd for 3 days, then 20 mg qd for 3 days, then 10mg  qd for 6 days, then stop. Take pc.  #24 x 1   Entered and Authorized by:   Tresa Garter MD   Signed by:   Tresa Garter MD on 11/29/2010   Method used:   Electronically to        [facility]* (retail)       [address]       [place], [place]  [address]       Ph: [phone]       Fax: [phone]   RxID:   [id number] AUGMENTIN 875-125 MG TABS (AMOXICILLIN-POT CLAVULANATE) 1 by mouth bid  #20 x 0   Entered and Authorized by:   Tresa Garter MD   Signed by:   Tresa Garter MD on 11/29/2010   Method used:   Electronically to        [facility]* (retail)       [address]       [place], [place]  [address]       Ph: [phone]       Fax: [phone]   RxID:   [id number]    Medication Administration  Injection # 1:    Medication: Rocephin  250mg     Diagnosis: LYMPHADENITIS (ICD-289.3)    Route: IM    Site: LUOQ gluteus    Exp Date: 06/19/2012    Lot #: [id number]    Mfr: Sandoz    Comments: pt rec 500mg     Patient tolerated injection without complications    Given by: Lanier Prude, [facility]) (November 29, 2010  3:25 PM)  Orders Added: 1)  Rocephin  250mg  [J0696] 2)  Admin of Therapeutic Inj  intramuscular or subcutaneous [96372] 3)  TLB-CBC Platelet - w/Differential [85025-CBCD] 4)  TLB-Sedimentation Rate (ESR) [85652-ESR] 5)  TLB-Mono Test (Monospot) [86308-MONO] 6)  Est. Patient Level III [16109] 7)  EMR Misc Charge Code [EMRMisc]

## 2010-12-21 NOTE — Assessment & Plan Note (Signed)
Summary: FACE AND NECK ARE SWOLLEN/ WORSE TODAY THAN YESTERDAY/ PER TR...   Vital Signs:  Patient profile:   50 year old male Height:      70 inches Weight:      216 pounds BMI:     31.10 O2 Sat:      95 % on Room air Temp:     98.2 degrees F oral Pulse rate:   71 / minute BP sitting:   100 / 78  (left arm) Cuff size:   regular  Vitals Entered By: Bill Salinas CMA (December 08, 2010 4:23 PM)  O2 Flow:  Room air CC: pt here for evaluation of swelling in his face x 2 days with headache and dizziness/ ab   Primary Care Provider:  Georgina Quint Plotnikov MD  CC:  pt here for evaluation of swelling in his face x 2 days with headache and dizziness/ ab.  History of Present Illness: Patient presents for URI symptoms with swelling of the face about the angle of the jaw, headache in the occiptal area, throat feels swollend and the ears filled clogged. When he changes position he feels off balance and dizzy. No fever, no SOB, no cough. No N/V. Admits to pain in both ears.   Current Medications (verified): 1)  Fluticasone Propionate 50 Mcg/act  Susp (Fluticasone Propionate) .... 2 Sprays Each Nostril Once Daily 2)  Vitamin D3 1000 Unit  Tabs (Cholecalciferol) .Marland Kitchen.. 1 Qd 3)  Nabumetone 500 Mg Tabs (Nabumetone) .Marland Kitchen.. 1 Two Times A Day As Needed 4)  Ranitidine Hcl 300 Mg Tabs (Ranitidine Hcl) .Marland Kitchen.. 1 Po Qd 5)  Metamucil 0.52 Gm Caps (Psyllium) .... 2 Caps A Day 6)  Loratadine-Pseudoephedrine 10-240 Mg Xr24h-Tab (Loratadine-Pseudoephedrine) .Marland Kitchen.. 1 By Mouth Qam 7)  Hydroxyzine Hcl 25 Mg Tabs (Hydroxyzine Hcl) .Marland Kitchen.. 1-2 Tabs By Mouth Two Times A Day As Needed For Ringing in The Ears 8)  Prednisone 10 Mg Tabs (Prednisone) .... Take 40mg  Qd For 3 Days, Then 20 Mg Qd For 3 Days, Then 10mg  Qd For 6 Days, Then Stop. Take Pc. 9)  Hydrocodone-Acetaminophen 5-325 Mg Tabs (Hydrocodone-Acetaminophen) .Marland Kitchen.. 1-2 By Mouth Two Times A Day As Needed Pain 10)  Maxalt-Mlt 10 Mg Tbdp (Rizatriptan Benzoate) .Marland Kitchen.. 1 By Mouth  Once Daily As Needed Migraine 11)  Augmentin 875-125 Mg Tabs (Amoxicillin-Pot Clavulanate) .Marland Kitchen.. 1 By Mouth Bid  Allergies (verified): 1)  * Seafood  Past History:  Past Medical History: Last updated: 03/23/2010 Sinusitis   Dr Pollyann Kennedy Allergic rhinitis Low back pain GERD Tinnitus L 2011  Past Surgical History: Last updated: 10/31/2007 Sinus surgery 2008  Social History: Last updated: 10/31/2007 Occupation: Academic librarian Married Former Smoker  Review of Systems  The patient denies anorexia, fever, weight loss, weight gain, vision loss, hoarseness, chest pain, syncope, peripheral edema, prolonged cough, abdominal pain, severe indigestion/heartburn, muscle weakness, transient blindness, depression, abnormal bleeding, enlarged lymph nodes, and angioedema.    Physical Exam  General:  Heavy-set [place] man in no acute distress Head:  normocephalic and atraumatic.   Ears:  EACs clear. TMs pearly but landmarks not well visualized.  Nose:  no external deformity, no external erythema, and no nasal discharge.   Mouth:  no buccal or palatal lesions. Posterior pharynx clear Neck:  supple, full ROM, and no thyromegaly.   Lungs:  no intercostal retractions and normal breath sounds.   Heart:  normal rate and regular rhythm.   Msk:  normal ROM.   Pulses:  2+ radial Neurologic:  alert & oriented  X3 and strength normal in all extremities.   Cervical Nodes:  tender at the submandibular nodes left worse than right. Tender at the anterior cervical chain.  Psych:  Oriented X3 and memory intact for recent and remote.     Impression & Recommendations:  Problem # 1:  URI (ICD-465.9) Recurrent URI., similar to previous episodes. Not acute changes on exam.  Plan - prednisone 10mg  3 once daily x 3, 2 once daily x 3 1 once daily x 6; \        Amox/clav 875 two times a day x 7          continue loratidine/pseudoephedrine           hydrocodone/apap 5/325 q6 hr as needed.  His updated medication  list for this problem includes:    Nabumetone 500 Mg Tabs (Nabumetone) .Marland Kitchen... 1 two times a day as needed    Loratadine-pseudoephedrine 10-240 Mg Xr24h-tab (Loratadine-pseudoephedrine) .Marland Kitchen... 1 by mouth qam  Complete Medication List: 1)  Fluticasone Propionate 50 Mcg/act Susp (Fluticasone propionate) .... 2 sprays each nostril once daily 2)  Vitamin D3 1000 Unit Tabs (Cholecalciferol) .Marland Kitchen.. 1 qd 3)  Nabumetone 500 Mg Tabs (Nabumetone) .Marland Kitchen.. 1 two times a day as needed 4)  Ranitidine Hcl 300 Mg Tabs (Ranitidine hcl) .Marland Kitchen.. 1 po qd 5)  Metamucil 0.52 Gm Caps (Psyllium) .... 2 caps a day 6)  Loratadine-pseudoephedrine 10-240 Mg Xr24h-tab (Loratadine-pseudoephedrine) .Marland Kitchen.. 1 by mouth qam 7)  Hydroxyzine Hcl 25 Mg Tabs (Hydroxyzine hcl) .Marland Kitchen.. 1-2 tabs by mouth two times a day as needed for ringing in the ears 8)  Prednisone 10 Mg Tabs (Prednisone) .... 3 tabs once daily x 3, 2 tabs once daily x 3, 1 tab once daily x 6 9)  Hydrocodone-acetaminophen 5-325 Mg Tabs (Hydrocodone-acetaminophen) .Marland Kitchen.. 1-2 by mouth two times a day as needed pain 10)  Maxalt-mlt 10 Mg Tbdp (Rizatriptan benzoate) .Marland Kitchen.. 1 by mouth once daily as needed migraine  Patient Instructions: 1)  Recurrent upper respiratory infection - plan: augmentin 875mg  two times a day x 10; continue loratdine/pseudoephedrine 24hr daily; prednisone as directed; hydrocodone/APAP 5/325 every 6 hours as needed.  Prescriptions: HYDROCODONE-ACETAMINOPHEN 5-325 MG TABS (HYDROCODONE-ACETAMINOPHEN) 1-2 by mouth two times a day as needed pain  #30 x 1   Entered and Authorized by:   Jacques Navy MD   Signed by:   Jacques Navy MD on 12/08/2010   Method used:   Handwritten   RxID:   [id number] AUGMENTIN 875-125 MG TABS (AMOXICILLIN-POT CLAVULANATE) 1 by mouth bid  #20 x 0   Entered and Authorized by:   Jacques Navy MD   Signed by:   Jacques Navy MD on 12/08/2010   Method used:   Electronically to        [facility]* (retail)        [address]       [place], [place]  [id number]       Ph: [phone]       Fax: [phone]   RxID:   [id number] PREDNISONE 10 MG TABS (PREDNISONE) 3 tabs once daily x 3, 2 tabs once daily x 3, 1 tab once daily x 6  #21 x 0   Entered and Authorized by:   Jacques Navy MD   Signed by:   Jacques Navy MD on 12/08/2010   Method used:   Electronically to        [facility]* (retail)  [address]       [place], [place]  [address]       Ph: [phone]       Fax: [phone]   RxID:   [id number]    Orders Added: 1)  Est. Patient Level III [24401]

## 2010-12-21 NOTE — Assessment & Plan Note (Signed)
Summary: stabbing in head through eyes and nose/ear pain/lb   Vital Signs:  Patient profile:   50 year old male Height:      70 inches Weight:      215 pounds BMI:     30.96 Temp:     98.5 degrees F oral Pulse rate:   84 / minute Pulse rhythm:   regular Resp:     16 per minute BP sitting:   130 / 90  (left arm) Cuff size:   regular  Vitals Entered By: Lanier Prude, CMA(AAMA) (November 10, 2010 3:20 PM) CC: HA on left side, pain under Lt eye and vision disturbances X 2-3 days Is Patient Diabetic? No   CC:  HA on left side and pain under Lt eye and vision disturbances X 2-3 days.  History of Present Illness: C/o L HA and L earache - sharp a nd severe - he was nauseated too. His L nose is stopped up. OTC meds did not help - getting worse...  Current Medications (verified): 1)  Fluticasone Propionate 50 Mcg/act  Susp (Fluticasone Propionate) .... 2 Sprays Each Nostril Once Daily 2)  Vitamin D3 1000 Unit  Tabs (Cholecalciferol) .Marland Kitchen.. 1 Qd 3)  Nabumetone 500 Mg Tabs (Nabumetone) .Marland Kitchen.. 1 Two Times A Day As Needed 4)  Ranitidine Hcl 300 Mg Tabs (Ranitidine Hcl) .Marland Kitchen.. 1 Po Qd 5)  Metamucil 0.52 Gm Caps (Psyllium) .... 2 Caps A Day 6)  Loratadine-Pseudoephedrine 10-240 Mg Xr24h-Tab (Loratadine-Pseudoephedrine) .Marland Kitchen.. 1 By Mouth Qam 7)  Hydroxyzine Hcl 25 Mg Tabs (Hydroxyzine Hcl) .Marland Kitchen.. 1-2 Tabs By Mouth Two Times A Day As Needed For Ringing in The Ears 8)  Prednisone 10 Mg Tabs (Prednisone) .... Take 40mg  Qd For 3 Days, Then 20 Mg Qd For 3 Days, Then 10mg  Qd For 6 Days, Then Stop. Take Pc.  Allergies (verified): 1)  * Seafood  Past History:  Past Medical History: Last updated: 03/23/2010 Sinusitis   Dr Pollyann Kennedy Allergic rhinitis Low back pain GERD Tinnitus L 2011  Social History: Last updated: 10/31/2007 Occupation: Academic librarian Married Former Smoker  Physical Exam  General:  Well-developed,overweight-appearing.   Head:  NT Eyes:  No corneal or conjunctival inflammation noted.  EOMI. Perrla.  Ears:  L TM  are red with air/fluid level Nose:  no external deformity and no external erythema.  congested and clogged   tender frontal areas   mild face tenderness; no green d/c Mouth:  WNL Neck:  No deformities, masses, or tenderness noted. Lungs:  Normal respiratory effort, chest expands symmetrically. Lungs are clear to auscultation, no crackles or wheezes.no dullness.   Heart:  Normal rate and regular rhythm. S1 and S2 normal without gallop, murmur, click, rub or other extra sounds.no lifts.   Msk:  WNL Neck is supple Skin:  No rash Psych:  Oriented X3, good eye contact, and not anxious appearing.     Impression & Recommendations:  Problem # 1:  MIGRAINE HEADACHE (ICD-346.90) triggered by #2 Assessment New  His updated medication list for this problem includes:    Nabumetone 500 Mg Tabs (Nabumetone) .Marland Kitchen... 1 two times a day as needed    Hydrocodone-acetaminophen 5-325 Mg Tabs (Hydrocodone-acetaminophen) .Marland Kitchen... 1-2 by mouth two times a day as needed pain    Maxalt-mlt 10 Mg Tbdp (Rizatriptan benzoate) .Marland Kitchen... 1 by mouth once daily as needed migraine  Problem # 2:  OTITIS MEDIA (ICD-382.9) Assessment: New  His updated medication list for this problem includes:    Nabumetone 500 Mg Tabs (Nabumetone) .Marland KitchenMarland KitchenMarland KitchenMarland Kitchen  1 two times a day as needed    Augmentin 875-125 Mg Tabs (Amoxicillin-pot clavulanate) .Marland Kitchen... 1 by mouth bid  Orders: Depo- Medrol 40mg  (J1030) Depo- Medrol 80mg  (J1040) Admin of Therapeutic Inj  intramuscular or subcutaneous (16109)  Problem # 3:  SINUSITIS - ACUTE-NOS (ICD-461.9) L Assessment: New  His updated medication list for this problem includes:    Fluticasone Propionate 50 Mcg/act Susp (Fluticasone propionate) .Marland Kitchen... 2 sprays each nostril once daily    Loratadine-pseudoephedrine 10-240 Mg Xr24h-tab (Loratadine-pseudoephedrine) .Marland Kitchen... 1 by mouth qam    Augmentin 875-125 Mg Tabs (Amoxicillin-pot clavulanate) .Marland Kitchen... 1 by mouth bid  Complete Medication  List: 1)  Fluticasone Propionate 50 Mcg/act Susp (Fluticasone propionate) .... 2 sprays each nostril once daily 2)  Vitamin D3 1000 Unit Tabs (Cholecalciferol) .Marland Kitchen.. 1 qd 3)  Nabumetone 500 Mg Tabs (Nabumetone) .Marland Kitchen.. 1 two times a day as needed 4)  Ranitidine Hcl 300 Mg Tabs (Ranitidine hcl) .Marland Kitchen.. 1 po qd 5)  Metamucil 0.52 Gm Caps (Psyllium) .... 2 caps a day 6)  Loratadine-pseudoephedrine 10-240 Mg Xr24h-tab (Loratadine-pseudoephedrine) .Marland Kitchen.. 1 by mouth qam 7)  Hydroxyzine Hcl 25 Mg Tabs (Hydroxyzine hcl) .Marland Kitchen.. 1-2 tabs by mouth two times a day as needed for ringing in the ears 8)  Prednisone 10 Mg Tabs (Prednisone) .... Take 40mg  qd for 3 days, then 20 mg qd for 3 days, then 10mg  qd for 6 days, then stop. take pc. 9)  Augmentin 875-125 Mg Tabs (Amoxicillin-pot clavulanate) .Marland Kitchen.. 1 by mouth bid 10)  Hydrocodone-acetaminophen 5-325 Mg Tabs (Hydrocodone-acetaminophen) .Marland Kitchen.. 1-2 by mouth two times a day as needed pain 11)  Maxalt-mlt 10 Mg Tbdp (Rizatriptan benzoate) .Marland Kitchen.. 1 by mouth once daily as needed migraine  Patient Instructions: 1)  Call if you are not better in a reasonable amount of time or if worse. Go to ER if feeling really bad!  Prescriptions: MAXALT-MLT 10 MG TBDP (RIZATRIPTAN BENZOATE) 1 by mouth once daily as needed migraine  #6 x 3   Entered and Authorized by:   Tresa Garter MD   Signed by:   Tresa Garter MD on 11/10/2010   Method used:   Print then Give to Patient   RxID:   [id number] FLUTICASONE PROPIONATE 50 MCG/ACT  SUSP (FLUTICASONE PROPIONATE) 2 sprays each nostril once daily  #1 x 12   Entered and Authorized by:   Tresa Garter MD   Signed by:   Tresa Garter MD on 11/10/2010   Method used:   Print then Give to Patient   RxID:   [id number] PREDNISONE 10 MG TABS (PREDNISONE) Take 40mg  qd for 3 days, then 20 mg qd for 3 days, then 10mg  qd for 6 days, then stop. Take pc.  #24 x 1   Entered and Authorized by:   Tresa Garter MD    Signed by:   Tresa Garter MD on 11/10/2010   Method used:   Print then Give to Patient   RxID:   [id number] HYDROCODONE-ACETAMINOPHEN 5-325 MG TABS (HYDROCODONE-ACETAMINOPHEN) 1-2 by mouth two times a day as needed pain  #30 x 0   Entered and Authorized by:   Tresa Garter MD   Signed by:   Tresa Garter MD on 11/10/2010   Method used:   Print then Give to Patient   RxID:   [id number] AUGMENTIN 875-125 MG TABS (AMOXICILLIN-POT CLAVULANATE) 1 by mouth bid  #28 x 1   Entered and Authorized by:   Tresa Garter MD  Signed by:   Tresa Garter MD on 11/10/2010   Method used:   Print then Give to Patient   RxID:   [id number]    Medication Administration  Injection # 1:    Medication: Depo- Medrol 40mg     Diagnosis: OTITIS MEDIA (ICD-382.9)    Route: IM    Site: LUOQ gluteus    Exp Date: 02/17/2013    Lot #: [id number]    Mfr: Pharmacia    Comments: pt rec 120mg     Patient tolerated injection without complications    Given by: Lanier Prude, CMA(AAMA) (November 10, 2010 4:11 PM)  Injection # 2:    Medication: Depo- Medrol 80mg     Diagnosis: OTITIS MEDIA (ICD-382.9)    Comments: same as above  Orders Added: 1)  Depo- Medrol 40mg  [J1030] 2)  Depo- Medrol 80mg  [J1040] 3)  Admin of Therapeutic Inj  intramuscular or subcutaneous [96372] 4)  Est. Patient Level IV [46962]

## 2010-12-21 NOTE — Progress Notes (Signed)
Summary: WORK IN?  Phone Note Call from Patient   Caller: Joe Hawkins - Summary of Call: Pt was seen 12/23 - given rxs and refilled meds. Pt c/o swelling in glands and painful swallowing. Would like work in apt soon for re-eval. OK for wk-in?   Ret call to [facility] and give her the apt info. She will give into to pt due to communication barrier.  Initial call taken by: Lamar Sprinkles, CMA,  November 28, 2010 12:52 PM  Follow-up for Phone Call        ok Follow-up by: Tresa Garter MD,  November 28, 2010 1:21 PM  Additional Follow-up for Phone Call Additional follow up Details #1::        Informed about apt tomorrow Additional Follow-up by: Lamar Sprinkles, CMA,  November 28, 2010 2:07 PM

## 2010-12-25 ENCOUNTER — Telehealth: Payer: Self-pay | Admitting: Internal Medicine

## 2011-01-04 NOTE — Progress Notes (Signed)
Summary: Prednisone RF  Phone Note Refill Request Message from:  Pharmacy  Refills Requested: Medication #1:  PREDNISONE 10 MG TABS 3 tabs once daily x 3   Dosage confirmed as above?Dosage Confirmed   Supply Requested: 24   Last Refilled: 11/28/2010 [facility]   Method Requested: Electronic Next Appointment Scheduled: none Initial call taken by: Lanier Prude, [facility]),  December 25, 2010 11:26 AM  Follow-up for Phone Call        ok to ref ov if sick Follow-up by: Tresa Garter MD,  December 25, 2010 6:06 PM    Prescriptions: PREDNISONE 10 MG TABS (PREDNISONE) 3 tabs once daily x 3, 2 tabs once daily x 3, 1 tab once daily x 6  #21 x 0   Entered by:   Lamar Sprinkles, CMA   Authorized by:   Tresa Garter MD   Signed by:   Lamar Sprinkles, CMA on 12/25/2010   Method used:   Electronically to        [facility]  [address] [id number]* (retail)       [address]       [place], [place]  [address]       Ph: [phone] or [phone]       Fax: [phone]   RxID:   [id number]

## 2011-04-03 NOTE — Op Note (Signed)
NAME:  CHEABRainey, Kahrs                  ACCOUNT NO.:  [id number]   MEDICAL RECORD NO.:  [id number]          PATIENT TYPE:  AMB   LOCATION:  DSC                          FACILITY:  [facility]   PHYSICIAN:  Jefry H. Pollyann Kennedy, MD     DATE OF BIRTH:  September 19, 1961   DATE OF PROCEDURE:  10/20/2007  DATE OF DISCHARGE:                               OPERATIVE REPORT   PREOPERATIVE DIAGNOSIS:  Chronic ethmoid sinusitis, chronic maxillary  sinusitis, chronic frontal sinusitis.   POSTOPERATIVE DIAGNOSIS:  Chronic ethmoid sinusitis, chronic maxillary  sinusitis, chronic frontal sinusitis.   PROCEDURE:  1. Bilateral endoscopic total ethmoidectomy.  2. Bilateral endoscopic frontal sinusotomy.  3. Bilateral endoscopic maxillary antrostomy with removal of tissue.   ANESTHESIA:  General endotracheal anesthesia was used.   COMPLICATIONS:  No complications.   ESTIMATED BLOOD LOSS:  About 500 mL.   FINDINGS:  Diffuse bony sclerosis and polypoid disease with some  purulent discharge in some of the sinus cells.   HISTORY:  A 50 year old gentleman with a long history of chronic  sinusitis, has been treated aggressively with medical therapy and has  failed to resolve his chronic disease.  Risks, benefits, alternatives,  complications of procedure explained to the patient who seemed to  understand and agreed to surgery.   PROCEDURE:  The patient was taken to the operating room, placed on the  operating table in the supine position.  Following induction of general  endotracheal anesthesia, the table was turned and the patient was draped  in standard fashion.  Oxymetazoline spray was used preoperatively.  One  percent Xylocaine with epinephrine was infiltrated into the superior and  posterior attachments of the middle turbinates and the lateral nasal  wall.  Afrin soaked pledgets were used on the right side at the  beginning of the procedure.   1. Bilateral total endoscopic ethmoidectomy.  The uncinate process  was      very thickened and sclerotic and was taken down as much as possible      with a sickle knife.  The bulla was exposed and entered initially      with a straight suction and then using the microdebrider it was      exonerated.  The complete ethmoid dissection was then accomplished      using combination of Wilde-Blakesley forceps, through cut forceps      and the microdebrider.  A microdebrider with a bipolar cautery      attachment was used for this procedure.  The bipolar cautery was      used in a couple of instances for small bleeders along the ethmoid,      but there no significant bleeders identified.  The lateral limit of      dissection was the lamina papyracea bilaterally which was kept      intact.  The ground lamella was taken down to expose the posterior      cells.  The fovea was the superior limit of dissection and was also      intact.  Complete ethmoid dissection was performed and a large  amount of polypoid tissue was obtained and sent for pathologic      evaluation.   1. Bilateral endoscopic maxillary antrostomy with removal of tissue.      Using a curved suction and 30 degree endoscope, the infundibular      area was inspected and fontanelle was identified and opened into      the maxillary sinus.  The ostium was enlarged using backbiting      forceps and through cut forceps posteriorly.  A large amount of      polypoid material was identified within both maxillary sinuses and      this was cleaned out a using combination of angled forceps,      backbiting forceps and giraffe forceps.  Wide antrostomies were      created bilaterally.   1. Bilateral endoscopic frontal sinusotomy.  After the ethmoid      dissection was completed, the frontal ethmoid recess area was      inspected and was cleaned of polypoid tissue bilaterally.  Frontal      sinus duct was opened bilaterally and cleaned of polyps and frontal      sinuses were inspected and were nicely patent.   The ethmoid      cavities were packed with Kyung Rudd packs.  The nasal cavities and      pharynx was suctioned of blood and secretions.  The patient was      awakened, extubated and transferred to recovery in stable      condition.      Jefry H. Pollyann Kennedy, MD  Electronically Signed     JHR/MEDQ  D:  10/20/2007  T:  10/20/2007  Job:  [id number]   cc:   Georgina Quint. Plotnikov, MD

## 2011-04-06 NOTE — Assessment & Plan Note (Signed)
[facility]                           PRIMARY CARE OFFICE NOTE   NAME:Joe Hawkins, Joe Hawkins                           MRN:          [id number]  DATE:03/21/2007                            DOB:          1961-07-17    The patient is a 50 year old male, a new patient, who presents with his  friend, Kelvin Cellar, with complaint of severe headache of one month's  duration.  He has had to leave work several times.  He feels hot at  times.  No double vision.  No vomiting.  He went to urgent care twice  and received a course of Augmentin 875 mg twice a day for 10 days, an  antiinflammatory medicine, cyclobenzaprine.  The medicines make his  stomach upset and it seems like cyclobenzaprine makes his shake at  nighttime.   ALLERGIES:  None.   MEDICATIONS:  Tylenol, cyclobenzaprine, nabumetone, diclofenac.   SOCIAL HISTORY:  He is a Academic librarian.  He is from [place].  Smokes one pack  every two days.  No alcohol.   PAST MEDICAL HISTORY:  Unremarkable except for low back surgery by Dr.  Newell Coral.   REVIEW OF SYSTEMS:  He feels hot, like he is having a temperature.  No  night sweats.  No syncope.  No double vision.  No toothache.  No weight  loss.   PHYSICAL EXAMINATION:  VITAL SIGNS:  Blood pressure 112/76.  Pulse 90.  Temperature 97.  Weight 200 pounds.  GENERAL:  He is in no acute distress, looks well.  HEENT:  Moist mucosa.  Swollen, erythematous nasal passages.  His left  mastoid is tender to palpation.  LUNGS:  Clear.  No wheeze or rales.  HEART:  S1, S2.  No gallop.  ABDOMEN:  Soft, nontender.  No organomegaly.  EXTREMITIES:  Lower extremities without edema.  SKIN:  Clear.  No meningeal signs.  NEUROLOGIC:  He is alert, oriented, cooperative.   LABORATORY DATA:  CT scan on February 24, 2007 revealed mucosal thickening  in the paranasal sinuses (ethmoid, maxillary, sphenoid).  Also, there  was opacification of the mastoid air cells on the left.    ASSESSMENT/PLAN:  1. Pansinusitis with likely left mastoiditis.  I discussed this with      the patient; prescribed Levaquin 750 mg daily for 30 days, Nasonex      two sprays each nostril once a day.  Stop all other medicines.      Return to clinic in one month.  Call me if worse.  Obtain CBC, TSH,      sedimentation rate, and a few other tests.  He should stop smoking.  2. Left mastoiditis.  Plan as above.  3. Headache.  Likely due to the above problems.  I gave him Darvocet-N      100 to use one four times daily p.r.n., #60 with one refill.  He is      not supposed to drive on it.  He can take Tylenol or Motrin if      needed.  4. Upset stomach from nonsteroidal antiinflammatories.  He can  take      Prilosec over-the-counter one daily p.r.n.     Georgina Quint. Plotnikov, MD  Electronically Signed    AVP/MedQ  DD: 03/21/2007  DT: 03/21/2007  Job #: [id number]

## 2011-04-06 NOTE — Op Note (Signed)
NAMETEAGON, KRON                  ACCOUNT NO.:  [id number]   MEDICAL RECORD NO.:  [id number]          PATIENT TYPE:  AMB   LOCATION:  SDS                          FACILITY:  [facility]   PHYSICIAN:  Hewitt Shorts, M.D.DATE OF BIRTH:  09/05/1961   DATE OF PROCEDURE:  03/13/2006  DATE OF DISCHARGE:                                 OPERATIVE REPORT   PREOPERATIVE DIAGNOSES:  1.  Left L4-5 lumbar disk herniation  2.  Lumbar degenerative disk disease.  3.  Lumbar spondylosis.  4.  Lumbar radiculopathy.   POSTOPERATIVE DIAGNOSES:  1.  Left L4-5 lumbar disk herniation  2.  Lumbar degenerative disk disease.  3.  Lumbar spondylosis.  4.  Lumbar radiculopathy.   OPERATION/PROCEDURE:  1.  L4-5 lumbar laminotomy.  2.  Microdiskectomy with microdissection.   SURGEON:  Hewitt Shorts, M.D.   ASSISTANT:  Stefani Dama, M.D. and Nelia Shi. Webb Silversmith, R.N.   ANESTHESIA:  General endotracheal anesthesia.   INDICATIONS:  The patient is a 50 year old man who presented with left  lumbar radiculopathy and was found to have a left L4-5 lumbar disk  herniation.  Decision was made to proceed with elective laminotomy and  elective diskectomy.   DESCRIPTION OF PROCEDURE:  The patient was brought to the operating room and  placed under general endotracheal anesthesia.  The patient was turned to the  prone position.  Lumbar region was prepped with Betadine soap and solution  and draped in the sterile fashion.  The midline was infiltrated with local  anesthetic with epinephrine, and an x-ray was taken.  The L4-5 level was  identified and a midline incision made, carried down through the  subcutaneous tissue.  Bipolar cautery and electrocautery used to maintain  hemostasis.  Dissection was carried down through the lumbar fascia which was  incised from the left side of the midline where the paraspinal muscle were  dissected in the spinous processes of the lamina in a subperiosteal fashion.  A  self-retaining retractor was placed.  Another x-ray was taken.  The L4-5  intralaminar space was identified and then the microscope was draped and  brought into the field to provide additional  magnification, illumination  and visualization.  The remainder of the decompression was performed through  microdissection and microsurgical technique.  Laminotomy was performed using  the Ex-Max drill and Kerrison punches.  The ligamentum flavum was carefully  removed and we identified the thecal sac and exiting left L5 nerve root.  These structures were retracted medially and the underlying subligamentous  disc herniation was identified.  The remaining annular fibers were incised  and diskectomy performed using a variety of pituitary rongeurs and  microcurets.  A thorough diskectomy was performed with removal of bulging  fragment disk material.  In the end good decompression of the thecal sac and  nerve root was achieved.  The removal of all loose fragments of disk  material was achieved from both disk space and the epidural space.  The  wound was irrigated with bacitracin solution on numerous occasions  throughout the procedure.  Once  the diskectomy was completed and hemostasis  was established we put 2 mL of fentanyl and 80 mL of Depo-Medrol into the  epidural space and proceeded with closure.  The deep fascia was closed with  interrupted undyed #1 Vicryl suture.  Scarpa's fascia was closed with  interrupted undyed #1 Vicryl suture, subcutaneous and subcuticular closed  with interrupted inverted 2-0 Vicryl sutures and the skin was reapproximated  with Dermabond.  The procedure was tolerated well.  Estimated blood loss was  25 mL.  Sponge count correct.   Following surgery the patient was turned back to the supine position to be  reverse anesthesia, extubated and transferred to the recovery room for  further care.      Hewitt Shorts, M.D.  Electronically Signed     RWN/MEDQ  D:   03/13/2006  T:  03/14/2006  Job:  [id number]

## 2011-04-24 ENCOUNTER — Other Ambulatory Visit: Payer: Self-pay | Admitting: Internal Medicine

## 2011-04-27 ENCOUNTER — Ambulatory Visit (INDEPENDENT_AMBULATORY_CARE_PROVIDER_SITE_OTHER): Payer: BC Managed Care – PPO | Admitting: Internal Medicine

## 2011-04-27 ENCOUNTER — Encounter: Payer: Self-pay | Admitting: Internal Medicine

## 2011-04-27 ENCOUNTER — Telehealth: Payer: Self-pay

## 2011-04-27 DIAGNOSIS — J019 Acute sinusitis, unspecified: Secondary | ICD-10-CM

## 2011-04-27 DIAGNOSIS — J309 Allergic rhinitis, unspecified: Secondary | ICD-10-CM

## 2011-04-27 DIAGNOSIS — K5900 Constipation, unspecified: Secondary | ICD-10-CM | POA: Insufficient documentation

## 2011-04-27 MED ORDER — POLYETHYLENE GLYCOL 3350 17 GM/SCOOP PO POWD: 17.0000 g | Freq: Every day | ORAL | Status: AC

## 2011-04-27 MED ORDER — METHYLPREDNISOLONE ACETATE 80 MG/ML IJ SUSP
120.0000 mg | Freq: Once | INTRAMUSCULAR | Status: AC
  Administered 2011-04-27: 120 mg via INTRAMUSCULAR

## 2011-04-27 MED ORDER — AMOXICILLIN-POT CLAVULANATE 500-125 MG PO TABS: 1.0000 | ORAL_TABLET | Freq: Three times a day (TID) | ORAL | Status: AC

## 2011-04-27 NOTE — Assessment & Plan Note (Signed)
Start augmentin and depo-medrol IM given today

## 2011-04-27 NOTE — Telephone Encounter (Signed)
abstract

## 2011-04-27 NOTE — Progress Notes (Signed)
Subjective:    Patient ID: Joe Hawkins, male    DOB: 28-Sep-1961, 50 y.o.   MRN: [id number]  Constipation This is a new problem. The current episode started more than 1 month ago. The problem is unchanged. His stool frequency is 1 time per day. The stool is described as firm and formed. The patient is not on a high fiber diet. He does not exercise regularly. There has not been adequate water intake. Pertinent negatives include no abdominal pain, anorexia, back pain, bloating, diarrhea, difficulty urinating, fecal incontinence, fever, flatus, hematochezia, hemorrhoids, melena, nausea, rectal pain, vomiting or weight loss. Risk factors include change in medication usage/dosage. He has tried nothing for the symptoms.  Sinusitis This is a new problem. The current episode started in the past 7 days. The problem has been gradually worsening since onset. There has been no fever. The fever has been present for less than 1 day. Associated symptoms include congestion, ear pain, sinus pressure, sneezing and a sore throat. Pertinent negatives include no chills, coughing, diaphoresis, headaches, hoarse voice, neck pain, shortness of breath or swollen glands. Past treatments include oral decongestants. The treatment provided no relief.      Review of Systems  Constitutional: Negative for fever, chills, weight loss and diaphoresis.  HENT: Positive for ear pain, congestion, sore throat, rhinorrhea, sneezing, postnasal drip and sinus pressure. Negative for hearing loss, nosebleeds, hoarse voice, facial swelling, drooling, mouth sores, trouble swallowing, neck pain, neck stiffness, dental problem, voice change and tinnitus.   Eyes: Negative for photophobia, pain, redness and visual disturbance.  Respiratory: Negative for apnea, cough, shortness of breath, wheezing and stridor.   Cardiovascular: Negative for chest pain, palpitations and leg swelling.  Gastrointestinal: Positive for constipation. Negative for nausea,  vomiting, abdominal pain, diarrhea, blood in stool, melena, hematochezia, abdominal distention, anal bleeding, rectal pain, bloating, anorexia, flatus and hemorrhoids.  Genitourinary: Negative for dysuria, urgency, frequency, hematuria, flank pain, decreased urine volume, enuresis and difficulty urinating.  Musculoskeletal: Negative for myalgias, back pain, joint swelling, arthralgias and gait problem.  Skin: Negative for color change, pallor and rash.  Neurological: Negative for dizziness, tremors, seizures, syncope, facial asymmetry, speech difficulty, weakness, light-headedness, numbness and headaches.  Hematological: Negative for adenopathy. Does not bruise/bleed easily.  Psychiatric/Behavioral: Negative.        Objective:   Physical Exam  Vitals reviewed. Constitutional: He is oriented to person, place, and time. He appears well-developed and well-nourished. No distress.  HENT:  Right Ear: Hearing, external ear and ear canal normal. No drainage, swelling or tenderness. Tympanic membrane is injected, erythematous and retracted. Tympanic membrane is not perforated and not bulging. Tympanic membrane mobility is normal. No decreased hearing is noted.  Left Ear: Hearing, tympanic membrane, external ear and ear canal normal. No drainage, swelling or tenderness. Tympanic membrane is not injected, not perforated, not erythematous, not retracted and not bulging. Tympanic membrane mobility is normal. No decreased hearing is noted.  Nose: Mucosal edema and rhinorrhea present. No nose lacerations, sinus tenderness, nasal deformity, septal deviation or nasal septal hematoma. No epistaxis.  No foreign bodies. Right sinus exhibits maxillary sinus tenderness. Right sinus exhibits no frontal sinus tenderness. Left sinus exhibits maxillary sinus tenderness. Left sinus exhibits no frontal sinus tenderness.  Eyes: Conjunctivae and EOM are normal. Pupils are equal, round, and reactive to light. Right eye exhibits  no discharge. Left eye exhibits no discharge. No scleral icterus.  Neck: Normal range of motion. Neck supple. No JVD present. No tracheal deviation present. No thyromegaly present.  Cardiovascular: Normal rate, normal heart sounds and intact distal pulses.  Exam reveals no gallop and no friction rub.   No murmur heard. Pulmonary/Chest: Effort normal and breath sounds normal. No stridor. No respiratory distress. He has no wheezes. He has no rales. He exhibits no tenderness.  Abdominal: Soft. Bowel sounds are normal. He exhibits no distension and no mass. There is no tenderness. There is no rebound and no guarding.  Musculoskeletal: Normal range of motion. He exhibits no edema and no tenderness.  Lymphadenopathy:    He has no cervical adenopathy.  Neurological: He is alert and oriented to person, place, and time. He has normal reflexes.  Skin: Skin is warm and dry. No rash noted. He is not diaphoretic. No erythema. No pallor.  Psychiatric: He has a normal mood and affect. His behavior is normal. Judgment and thought content normal.          Assessment & Plan:

## 2011-04-27 NOTE — Patient Instructions (Signed)
Sinusitis Sinuses are air pockets within the bones of your face. The growth of bacteria within a sinus leads to infection. Infection keeps the sinuses from draining. This infection is called sinusitis. SYMPTOMS There will be different areas of pain depending on which sinuses have become infected.  The maxillary sinuses often produce pain beneath the eyes.   Frontal sinusitis may cause pain in the middle of the forehead and above the eyes.  Other problems (symptoms) include:  Toothaches.   Colored, pus-like (purulent) drainage from the nose.   Any swelling, warmth, or tenderness over the sinus areas may be signs of infection.  TREATMENT Sinusitis is most often determined by an exam and you may have x-rays taken. If x-rays have been taken, make sure you obtain your results. Or find out how you are to obtain them. Your caregiver may give you medications (antibiotics). These are medications that will help kill the infection. You may also be given a medication (decongestant) that helps to reduce sinus swelling.  HOME CARE INSTRUCTIONS  Only take over-the-counter or prescription medicines for pain, discomfort, or fever as directed by your caregiver.   Drink extra fluids. Fluids help thin the mucus so your sinuses can drain more easily.   Applying either moist heat or ice packs to the sinus areas may help relieve discomfort.   Use saline nasal sprays to help moisten your sinuses. The sprays can be found at your local drugstore.  SEEK IMMEDIATE MEDICAL CARE IF YOU DEVELOP:  High fever that is still present after two days of antibiotic treatment.   Increasing pain, severe headaches, or toothache.   Nausea, vomiting, or drowsiness.   Unusual swelling around the face or trouble seeing.  MAKE SURE YOU:   Understand these instructions.   Will watch your condition.   Will get help right away if you are not doing well or get worse.  Document Released: 11/05/2005 Document Re-Released:  10/18/2008 [facility] Patient Information 2011 [place], [place].Constipation in Adults Constipation is having fewer than 2 bowel movements per week. Usually, the stools are hard. As we grow older, constipation is more common. If you try to fix constipation with laxatives, the problem may get worse. This is because laxatives taken over a long period of time make the colon muscles weaker. A low-fiber diet, not taking in enough fluids, and taking some medicines may make these problems worse. MEDICATIONS THAT MAY CAUSE CONSTIPATION  Water pills (diuretics).  Calcium channel blockers (used to control blood pressure and for the heart).   Certain pain medicines (narcotics).   Anticholinergics.  Anti-inflammatory agents.   Antacids that contain aluminum.   DISEASES THAT CONTRIBUTE TO CONSTIPATION  Diabetes.  Parkinson's disease.   Dementia.   Stroke.  Depression.   Illnesses that cause problems with salt and water metabolism.   HOME CARE INSTRUCTIONS  Constipation is usually best cared for without medicines. Increasing dietary fiber and eating more fruits and vegetables is the best way to manage constipation.   Slowly increase fiber intake to 25 to 38 grams per day. Whole grains, fruits, vegetables, and legumes are good sources of fiber. A dietitian can further help you incorporate high-fiber foods into your diet.   Drink enough water and fluids to keep your urine clear or pale yellow.   A fiber supplement may be added to your diet if you cannot get enough fiber from foods.   Increasing your activities also helps improve regularity.   Suppositories, as suggested by your caregiver, will also help. If  you are using antacids, such as aluminum or calcium containing products, it will be helpful to switch to products containing magnesium if your caregiver says it is okay.   If you have been given a liquid injection (enema) today, this is only a temporary measure. It should not be relied on  for treatment of longstanding (chronic) constipation.   Stronger measures, such as magnesium sulfate, should be avoided if possible. This may cause uncontrollable diarrhea. Using magnesium sulfate may not allow you time to make it to the bathroom.  SEEK IMMEDIATE MEDICAL CARE IF:  There is bright red blood in the stool.   The constipation stays for more than 4 days.   There is belly (abdominal) or rectal pain.   You do not seem to be getting better.   You have any questions or concerns.  MAKE SURE YOU:  Understand these instructions.   Will watch your condition.   Will get help right away if you are not doing well or get worse.  Document Released: 08/03/2004 Document Re-Released: 01/30/2010 [facility] Patient Information 2011 [place], [place].

## 2011-04-27 NOTE — Assessment & Plan Note (Signed)
Depo-medrol IM injection given

## 2011-04-27 NOTE — Assessment & Plan Note (Signed)
I think this is caused by the pain meds that he takes, I will offer him an Rx for Miralax and see if that helps, also I gave him pt ed material about constipation

## 2011-11-02 ENCOUNTER — Ambulatory Visit (INDEPENDENT_AMBULATORY_CARE_PROVIDER_SITE_OTHER): Payer: BC Managed Care – PPO | Admitting: Internal Medicine

## 2011-11-02 ENCOUNTER — Encounter: Payer: Self-pay | Admitting: Internal Medicine

## 2011-11-02 VITALS — BP 110/80 | HR 88 | Temp 98.0°F | Resp 16 | Wt 214.0 lb

## 2011-11-02 DIAGNOSIS — M545 Low back pain, unspecified: Secondary | ICD-10-CM

## 2011-11-02 DIAGNOSIS — Z23 Encounter for immunization: Secondary | ICD-10-CM

## 2011-11-02 DIAGNOSIS — J019 Acute sinusitis, unspecified: Secondary | ICD-10-CM

## 2011-11-02 DIAGNOSIS — H6690 Otitis media, unspecified, unspecified ear: Secondary | ICD-10-CM

## 2011-11-02 MED ORDER — LORATADINE-PSEUDOEPHEDRINE ER 10-240 MG PO TB24: 1.0000 | ORAL_TABLET | Freq: Every day | ORAL | Status: DC | PRN

## 2011-11-02 MED ORDER — AMOXICILLIN-POT CLAVULANATE 875-125 MG PO TABS: 1.0000 | ORAL_TABLET | Freq: Two times a day (BID) | ORAL | Status: DC

## 2011-11-02 MED ORDER — PREDNISONE 10 MG PO TABS: ORAL_TABLET | ORAL | Status: DC

## 2011-11-02 NOTE — Assessment & Plan Note (Signed)
PO abx 

## 2011-11-02 NOTE — Progress Notes (Signed)
  Subjective:    Patient ID: Joe Hawkins, male    DOB: [birth year], 50 y.o.   MRN: [id number]  HPI  C/o L ear pain and sinusitis x 1-2 wks  Review of Systems  Constitutional: Positive for chills.  HENT: Positive for congestion, rhinorrhea and sinus pressure.   Respiratory: Negative for shortness of breath.   Cardiovascular: Negative for chest pain.       Objective:   Physical Exam  HENT:  Right Ear: External ear normal.  Mouth/Throat: No oropharyngeal exudate.       L TM is red Nasal mucosa is swollen  Neck: No tracheal deviation present. No thyromegaly present.  Cardiovascular: Normal rate.   No murmur heard. Pulmonary/Chest: He has no wheezes. He has no rales.  Musculoskeletal: He exhibits no tenderness.  Lymphadenopathy:    He has no cervical adenopathy.  Skin: No rash noted.          Assessment & Plan:

## 2011-11-02 NOTE — Assessment & Plan Note (Addendum)
Po abx Predn if not better  Potential benefits of a short term steroid  use as well as potential risks  and complications were explained to the patient and were aknowledged.

## 2011-11-04 ENCOUNTER — Encounter: Payer: Self-pay | Admitting: Internal Medicine

## 2011-11-04 NOTE — Assessment & Plan Note (Signed)
Improved

## 2012-03-14 ENCOUNTER — Encounter: Payer: Self-pay | Admitting: Internal Medicine

## 2012-03-14 ENCOUNTER — Ambulatory Visit (INDEPENDENT_AMBULATORY_CARE_PROVIDER_SITE_OTHER): Payer: BC Managed Care – PPO | Admitting: Internal Medicine

## 2012-03-14 VITALS — BP 104/70 | HR 65 | Temp 97.4°F | Ht 70.0 in | Wt 221.1 lb

## 2012-03-14 DIAGNOSIS — J309 Allergic rhinitis, unspecified: Secondary | ICD-10-CM

## 2012-03-14 DIAGNOSIS — J019 Acute sinusitis, unspecified: Secondary | ICD-10-CM

## 2012-03-14 DIAGNOSIS — K219 Gastro-esophageal reflux disease without esophagitis: Secondary | ICD-10-CM

## 2012-03-14 MED ORDER — LEVOFLOXACIN 250 MG PO TABS: 250.0000 mg | ORAL_TABLET | Freq: Every day | ORAL | Status: AC

## 2012-03-14 MED ORDER — HYDROCODONE-HOMATROPINE 5-1.5 MG/5ML PO SYRP: 5.0000 mL | ORAL_SOLUTION | Freq: Four times a day (QID) | ORAL | Status: AC | PRN

## 2012-03-14 NOTE — Patient Instructions (Signed)
Take all new medications as prescribed - the antibiotic and cough mediciine Continue all other medications as before

## 2012-03-15 ENCOUNTER — Encounter: Payer: Self-pay | Admitting: Internal Medicine

## 2012-03-15 NOTE — Assessment & Plan Note (Signed)
stable overall by hx and exam, most recent data reviewed with pt, and pt to continue medical treatment as before - the lfovent, claritinD  SpO2 Readings from Last 3 Encounters:  03/14/12 97%  04/27/11 97%  12/08/10 95%

## 2012-03-15 NOTE — Assessment & Plan Note (Addendum)
Mild to mod, for antibx course,  to f/u any worsening symptoms or concerns, also for cough med for cough likely related

## 2012-03-15 NOTE — Progress Notes (Signed)
Subjective:    Patient ID: Joe Hawkins, male    DOB: 1960-11-21, 51 y.o.   MRN: [id number]  HPI   Here with 3 days acute onset fever, facial pain, pressure, general weakness and malaise, and greenish d/c, with slight ST, but little to no cough and Pt denies chest pain, increased sob or doe, wheezing, orthopnea, PND, increased LE swelling, palpitations, dizziness or syncope. Also with bilat ear discomfort and significant nonprod cough with occasional dizziness.  Pt denies new neurological symptoms such as new headache, or facial or extremity weakness or numbness   Pt denies polydipsia, polyuria.  Denies worsening reflux, dysphagia, abd pain, n/v, bowel change or blood.  Past Medical History  Diagnosis Date  . GERD (gastroesophageal reflux disease)   . Sinusitis     Dr Pollyann Kennedy  . Allergic rhinitis   . Low back pain   . Tinnitus 2011   Past Surgical History  Procedure Date  . Nasal sinus surgery 2008    reports that he has quit smoking. He does not have any smokeless tobacco history on file. He reports that he does not drink alcohol or use illicit drugs. family history is negative for Cancer. No Known Allergies Current Outpatient Prescriptions on File Prior to Visit  Medication Sig Dispense Refill  . Cholecalciferol (EQL VITAMIN D3) 1000 UNITS tablet Take 1,000 Units by mouth daily.        . fluticasone (FLOVENT DISKUS) 50 MCG/BLIST diskus inhaler Inhale 1 puff into the lungs 2 (two) times daily.        Marland Kitchen HYDROcodone-acetaminophen (NORCO) 5-325 MG per tablet Take 1 tablet by mouth 2 (two) times daily as needed. Take 1-2 bid prn       . hydrOXYzine (ATARAX) 25 MG tablet Take 25 mg by mouth 2 (two) times daily as needed. Take 1-2 bid prn       . loratadine-pseudoephedrine (CLARITIN-D 24-HOUR) 10-240 MG per 24 hr tablet Take 1 tablet by mouth daily as needed for allergies (congestion).  30 tablet  11  . nabumetone (RELAFEN) 500 MG tablet Take 500 mg by mouth 2 (two) times daily.        .  ranitidine (ZANTAC) 300 MG tablet TAKE 1 TABLET ONCE DAILY.  90 tablet  3  . rizatriptan (MAXALT-MLT) 10 MG disintegrating tablet Take 10 mg by mouth as needed. May repeat in 2 hours if needed       Review of Systems Review of Systems  Constitutional: Negative for diaphoresis and unexpected weight change.  HENT: Negative for drooling and tinnitus.   Eyes: Negative for photophobia and visual disturbance.  Respiratory: Negative for choking and stridor.   Gastrointestinal: Negative for vomiting and blood in stool.  Genitourinary: Negative for hematuria and decreased urine volume.  Musculoskeletal: Negative for gait problem.  Psychiatric/Behavioral: Negative for decreased concentration. The patient is not hyperactive.      Objective:   Physical Exam BP 104/70  Pulse 65  Temp(Src) 97.4 F (36.3 C) (Oral)  Ht 5\' 10"  (1.778 m)  Wt 221 lb 2 oz (100.302 kg)  BMI 31.73 kg/m2  SpO2 97% Physical Exam  VS noted, mild ill Constitutional: Pt appears well-developed and well-nourished.  HENT: Head: Normocephalic.  Right Ear: External ear normal.  Left Ear: External ear normal.  Bilat tm's mild erythema.  Sinus tender bilat.  Pharynx mild erythema Eyes: Conjunctivae and EOM are normal. Pupils are equal, round, and reactive to light.  Neck: Normal range of motion. Neck supple.  Cardiovascular: Normal  rate and regular rhythm.   Pulmonary/Chest: Effort normal and breath sounds normal.  Neurological: Pt is alert. Not confused Skin: Skin is warm. No erythema.  Psychiatric: Pt behavior is normal. Thought content normal. 1+ nervous    Assessment & Plan:

## 2012-03-15 NOTE — Assessment & Plan Note (Signed)
stable overall by hx and exam, , and pt to continue medical treatment as before   

## 2012-05-07 ENCOUNTER — Ambulatory Visit (INDEPENDENT_AMBULATORY_CARE_PROVIDER_SITE_OTHER): Payer: BC Managed Care – PPO | Admitting: Internal Medicine

## 2012-05-07 VITALS — BP 110/64 | HR 62 | Temp 98.2°F | Resp 16 | Wt 221.0 lb

## 2012-05-07 DIAGNOSIS — L309 Dermatitis, unspecified: Secondary | ICD-10-CM

## 2012-05-07 DIAGNOSIS — J019 Acute sinusitis, unspecified: Secondary | ICD-10-CM

## 2012-05-07 DIAGNOSIS — L259 Unspecified contact dermatitis, unspecified cause: Secondary | ICD-10-CM

## 2012-05-07 MED ORDER — TRIAMCINOLONE ACETONIDE 0.5 % EX CREA: TOPICAL_CREAM | Freq: Three times a day (TID) | CUTANEOUS | Status: DC

## 2012-05-07 MED ORDER — CEFUROXIME AXETIL 500 MG PO TABS: 500.0000 mg | ORAL_TABLET | Freq: Two times a day (BID) | ORAL | Status: AC

## 2012-05-07 NOTE — Patient Instructions (Signed)
Eczema Atopic dermatitis, or eczema, is an inherited type of sensitive skin. Often people with eczema have a family history of allergies, asthma, or hay fever. It causes a red itchy rash and dry scaly skin. The itchiness may occur before the skin rash and may be very intense. It is not contagious. Eczema is generally worse during the cooler winter months and often improves with the warmth of summer. Eczema usually starts showing signs in infancy. Some children outgrow eczema, but it may last through adulthood. Flare-ups may be caused by:  Eating something or contact with something you are sensitive or allergic to.   Stress.  DIAGNOSIS  The diagnosis of eczema is usually based upon symptoms and medical history. TREATMENT  Eczema cannot be cured, but symptoms usually can be controlled with treatment or avoidance of allergens (things to which you are sensitive or allergic to).  Controlling the itching and scratching.   Use over-the-counter antihistamines as directed for itching. It is especially useful at night when the itching tends to be worse.   Use over-the-counter steroid creams as directed for itching.   Scratching makes the rash and itching worse and may cause impetigo (a skin infection) if fingernails are contaminated (dirty).   Keeping the skin well moisturized with creams every day. This will seal in moisture and help prevent dryness. Lotions containing alcohol and water can dry the skin and are not recommended.   Limiting exposure to allergens.   Recognizing situations that cause stress.   Developing a plan to manage stress.  HOME CARE INSTRUCTIONS   Take prescription and over-the-counter medicines as directed by your caregiver.   Do not use anything on the skin without checking with your caregiver.   Keep baths or showers short (5 minutes) in warm (not hot) water. Use mild cleansers for bathing. You may add non-perfumed bath oil to the bath water. It is best to avoid soap and  bubble bath.   Immediately after a bath or shower, when the skin is still damp, apply a moisturizing ointment to the entire body. This ointment should be a petroleum ointment. This will seal in moisture and help prevent dryness. The thicker the ointment the better. These should be unscented.   Keep fingernails cut short and wash hands often. If your child has eczema, it may be necessary to put soft gloves or mittens on your child at night.   Dress in clothes made of cotton or cotton blends. Dress lightly, as heat increases itching.   Avoid foods that may cause flare-ups. Common foods include cow's milk, peanut butter, eggs and wheat.   Keep a child with eczema away from anyone with fever blisters. The virus that causes fever blisters (herpes simplex) can cause a serious skin infection in children with eczema.  SEEK MEDICAL CARE IF:   Itching interferes with sleep.   The rash gets worse or is not better within one week following treatment.   The rash looks infected (pus or soft yellow scabs).   You or your child has an oral temperature above 102 F (38.9 C).   Your baby is older than 3 months with a rectal temperature of 100.5 F (38.1 C) or higher for more than 1 day.   The rash flares up after contact with someone who has fever blisters.  SEEK IMMEDIATE MEDICAL CARE IF:   Your baby is older than 3 months with a rectal temperature of 102 F (38.9 C) or higher.   Your baby is older than  3 months or younger with a rectal temperature of 100.4 F (38 C) or higher.  Document Released: 11/02/2000 Document Revised: 10/25/2011 Document Reviewed: 09/07/2009 [facility] Patient Information 2012 [place], [place].Sinusitis Sinuses are air pockets within the bones of your face. The growth of bacteria within a sinus leads to infection. The infection prevents the sinuses from draining. This infection is called sinusitis. SYMPTOMS  There will be different areas of pain depending on which sinuses  have become infected.  The maxillary sinuses often produce pain beneath the eyes.   Frontal sinusitis may cause pain in the middle of the forehead and above the eyes.  Other problems (symptoms) include:  Toothaches.   Colored, pus-like (purulent) drainage from the nose.   Swelling, warmth, and tenderness over the sinus areas may be signs of infection.  TREATMENT  Sinusitis is most often determined by an exam.X-rays may be taken. If x-rays have been taken, make sure you obtain your results or find out how you are to obtain them. Your caregiver may give you medications (antibiotics). These are medications that will help kill the bacteria causing the infection. You may also be given a medication (decongestant) that helps to reduce sinus swelling.  HOME CARE INSTRUCTIONS   Only take over-the-counter or prescription medicines for pain, discomfort, or fever as directed by your caregiver.   Drink extra fluids. Fluids help thin the mucus so your sinuses can drain more easily.   Applying either moist heat or ice packs to the sinus areas may help relieve discomfort.   Use saline nasal sprays to help moisten your sinuses. The sprays can be found at your local drugstore.  SEEK IMMEDIATE MEDICAL CARE IF:  You have a fever.   You have increasing pain, severe headaches, or toothache.   You have nausea, vomiting, or drowsiness.   You develop unusual swelling around the face or trouble seeing.  MAKE SURE YOU:   Understand these instructions.   Will watch your condition.   Will get help right away if you are not doing well or get worse.  Document Released: 11/05/2005 Document Revised: 10/25/2011 Document Reviewed: 06/04/2007 [facility] Patient Information 2012 [place], [place].

## 2012-05-08 ENCOUNTER — Encounter: Payer: Self-pay | Admitting: Internal Medicine

## 2012-05-08 NOTE — Assessment & Plan Note (Signed)
Continue TAC as needed

## 2012-05-08 NOTE — Assessment & Plan Note (Signed)
Start ceftin for the infection 

## 2012-05-08 NOTE — Progress Notes (Signed)
Subjective:    Patient ID: Joe Hawkins, male    DOB: [birth year], 51 y.o.   MRN: [id number]  Sinusitis The current episode started in the past 7 days. The problem has been gradually worsening since onset. There has been no fever. The fever has been present for less than 1 day. He is experiencing no pain. Associated symptoms include chills, congestion, ear pain, sinus pressure, sneezing and a sore throat. Pertinent negatives include no coughing, diaphoresis, headaches, hoarse voice, neck pain, shortness of breath or swollen glands. Past treatments include nothing. The treatment provided no relief.  Rash This is a chronic problem. The current episode started more than 1 year ago. The problem is unchanged. The affected locations include the left arm and right arm. The rash is characterized by dryness and itchiness. He was exposed to nothing. Associated symptoms include congestion, rhinorrhea and a sore throat. Pertinent negatives include no cough, facial edema, fatigue, fever or shortness of breath. Past treatments include topical steroids. The treatment provided significant relief. His past medical history is significant for eczema.      Review of Systems  Constitutional: Positive for chills. Negative for fever, diaphoresis, activity change, fatigue and unexpected weight change.  HENT: Positive for ear pain, congestion, sore throat, rhinorrhea, sneezing, postnasal drip and sinus pressure. Negative for hearing loss, nosebleeds, hoarse voice, facial swelling, drooling, mouth sores, trouble swallowing, neck pain, neck stiffness, dental problem, voice change, tinnitus and ear discharge.   Eyes: Negative.   Respiratory: Negative for cough, chest tightness, shortness of breath, wheezing and stridor.   Cardiovascular: Negative for chest pain, palpitations and leg swelling.  Gastrointestinal: Negative.   Genitourinary: Negative.   Musculoskeletal: Negative.   Skin: Positive for rash (and itching on his  arms). Negative for color change, pallor and wound.  Neurological: Negative.  Negative for headaches.  Hematological: Negative for adenopathy. Does not bruise/bleed easily.  Psychiatric/Behavioral: Negative.        Objective:   Physical Exam  Vitals reviewed. Constitutional: He is oriented to person, place, and time. He appears well-developed and well-nourished.  Non-toxic appearance. He does not have a sickly appearance. He does not appear ill. No distress.  HENT:  Head: Normocephalic and atraumatic. No trismus in the jaw.  Right Ear: Hearing, tympanic membrane, external ear and ear canal normal.  Left Ear: Hearing, tympanic membrane, external ear and ear canal normal.  Nose: Mucosal edema and rhinorrhea present. No nose lacerations, sinus tenderness, nasal deformity, septal deviation or nasal septal hematoma. No epistaxis.  No foreign bodies. Right sinus exhibits maxillary sinus tenderness. Right sinus exhibits no frontal sinus tenderness. Left sinus exhibits maxillary sinus tenderness. Left sinus exhibits no frontal sinus tenderness.  Mouth/Throat: Oropharynx is clear and moist and mucous membranes are normal. Mucous membranes are not pale, not dry and not cyanotic. No uvula swelling. No oropharyngeal exudate, posterior oropharyngeal edema, posterior oropharyngeal erythema or tonsillar abscesses.  Eyes: Conjunctivae are normal. Right eye exhibits no discharge. Left eye exhibits no discharge. No scleral icterus.  Neck: Normal range of motion. Neck supple. No JVD present. No tracheal deviation present. No thyromegaly present.  Cardiovascular: Normal rate, regular rhythm, normal heart sounds and intact distal pulses.  Exam reveals no gallop and no friction rub.   No murmur heard. Pulmonary/Chest: Effort normal and breath sounds normal. No stridor. No respiratory distress. He has no wheezes. He has no rales. He exhibits no tenderness.  Abdominal: Soft. Bowel sounds are normal. He exhibits no  distension and no mass. There  is no tenderness. There is no rebound and no guarding.  Musculoskeletal: Normal range of motion. He exhibits no edema and no tenderness.  Lymphadenopathy:    He has no cervical adenopathy.  Neurological: He is oriented to person, place, and time.  Skin: Skin is warm, dry and intact. Rash noted. No abrasion, no bruising, no burn, no ecchymosis, no laceration, no lesion, no petechiae and no purpura noted. Rash is papular. Rash is not macular, not maculopapular, not nodular, not pustular, not vesicular and not urticarial. He is not diaphoretic. No cyanosis or erythema. No pallor. Nails show no clubbing.       He has patches of eczema on both forearms  Psychiatric: He has a normal mood and affect. His behavior is normal. Judgment and thought content normal.          Assessment & Plan:

## 2012-05-21 ENCOUNTER — Ambulatory Visit (INDEPENDENT_AMBULATORY_CARE_PROVIDER_SITE_OTHER): Payer: BC Managed Care – PPO | Admitting: Internal Medicine

## 2012-05-21 ENCOUNTER — Encounter: Payer: Self-pay | Admitting: Internal Medicine

## 2012-05-21 VITALS — BP 102/62 | HR 69 | Temp 98.3°F | Ht 70.0 in | Wt 218.8 lb

## 2012-05-21 DIAGNOSIS — T7840XA Allergy, unspecified, initial encounter: Secondary | ICD-10-CM

## 2012-05-21 DIAGNOSIS — L509 Urticaria, unspecified: Secondary | ICD-10-CM

## 2012-05-21 DIAGNOSIS — K5900 Constipation, unspecified: Secondary | ICD-10-CM

## 2012-05-21 MED ORDER — METHYLPREDNISOLONE ACETATE 80 MG/ML IJ SUSP
120.0000 mg | Freq: Once | INTRAMUSCULAR | Status: AC
  Administered 2012-05-21: 120 mg via INTRAMUSCULAR

## 2012-05-21 MED ORDER — POLYETHYLENE GLYCOL 3350 17 GM/SCOOP PO POWD: 17.0000 g | Freq: Two times a day (BID) | ORAL | Status: AC | PRN

## 2012-05-21 MED ORDER — EPINEPHRINE 0.3 MG/0.3ML IJ DEVI: 0.3000 mg | Freq: Once | INTRAMUSCULAR | Status: AC

## 2012-05-21 MED ORDER — PREDNISONE (PAK) 10 MG PO TABS: 10.0000 mg | ORAL_TABLET | ORAL | Status: AC

## 2012-05-21 NOTE — Patient Instructions (Addendum)
It was good to see you today. Steroid shot given today Use prednisone taper for next 6 days and continue antihistamines like claritin, atarax and Zantac as reviewed Also EpiPen to use IF you have trouble breathing or if lip/tongue swelling occurs or if unable to swallow Your prescription(s) have been submitted to your pharmacy. Please take as directed and contact our office if you believe you are having problem(s) with the medication(s). Allergic Reaction Allergic reactions can be caused by anything your body is sensitive to. Your body may be sensitive to food, medicines, molds, pollens, cockroaches, dust mites, pets, insect stings, and other things around you. An allergic reaction may cause puffiness (swelling), itching, sneezing, coughing, or problems breathing.   Allergies cannot be cured, but they can be controlled with medicine. Some allergies happen only at certain times of the year. Try to stay away from what causes your reaction if possible. Sometimes, it is hard to tell what causes your reaction. HOME CARE If you have a rash or red patches (hives) on your skin:  Take medicines as told by your doctor.   Do not drive or drink alcohol after taking medicines. They can make you sleepy.   Put cold cloths on your skin. Take baths in cool water. This will help your itching. Do not take hot baths or showers. Heat will make the itching worse.   If your allergies get worse, your doctor might give you other medicines. Talk to your doctor if problems continue.  GET HELP RIGHT AWAY IF:    You have trouble breathing.   You have a tight feeling in your chest or throat.   Your mouth gets puffy (swollen).   You have red, itchy patches on your skin (hives) that get worse.   You have itching all over your body.  MAKE SURE YOU:    Understand these instructions.   Will watch your condition.   Will get help right away if you are not doing well or get worse.  Document Released: 10/24/2009  Document Revised: 10/25/2011 Document Reviewed: 10/24/2009 [facility] Patient Information 2012 [place], [place].

## 2012-05-21 NOTE — Assessment & Plan Note (Signed)
Requests refill on miralax - erx done

## 2012-05-21 NOTE — Progress Notes (Signed)
  Subjective:    Patient ID: Joe Hawkins, male    DOB: 11-24-1960, 51 y.o.   MRN: [id number]  HPI  complains of rash on face Onset 48h ago associated with itch and "tight swallow" but no swelling lips/tongue Hx same - ?trigger  Past Medical History  Diagnosis Date  . GERD (gastroesophageal reflux disease)   . Sinusitis     Dr Pollyann Kennedy  . Allergic rhinitis   . Low back pain   . Tinnitus 2011   Review of Systems  Constitutional: Negative for fever and fatigue.  HENT: Negative for ear pain, sore throat, mouth sores, voice change, postnasal drip and tinnitus.   Respiratory: Negative for chest tightness and shortness of breath.   Cardiovascular: Negative for chest pain.  Gastrointestinal: Positive for constipation. Negative for nausea, diarrhea, blood in stool, anal bleeding and rectal pain.  Skin: Positive for rash. Negative for wound.  Neurological: Negative for headaches.       Objective:   Physical Exam BP 102/62  Pulse 69  Temp 98.3 F (36.8 C) (Oral)  Ht 5\' 10"  (1.778 m)  Wt 218 lb 12.8 oz (99.247 kg)  BMI 31.39 kg/m2  SpO2 96% Wt Readings from Last 3 Encounters:  05/21/12 218 lb 12.8 oz (99.247 kg)  05/07/12 221 lb (100.245 kg)  03/14/12 221 lb 2 oz (100.302 kg)   Constitutional:  He appears well-developed and well-nourished. No distress.  HENT: no angioedema - OP clear Neck: Normal range of motion. Neck supple. No JVD or LAD present. No thyromegaly present.  Cardiovascular: Normal rate, regular rhythm and normal heart sounds.  No murmur heard. no BLE edema Pulmonary/Chest: Effort normal and breath sounds normal. No respiratory distress. no wheezes. Skin: urticarial whelps on face - under R eye and L cheek - remaining skin is warm and dry.  No erythema or ulceration.  Psychiatric: he has a normal mood and affect. behavior is normal. Judgment and thought content normal.   Lab Results  Component Value Date   WBC 12.3* 11/29/2010   HGB 15.8 11/29/2010   HCT 47.0  11/29/2010   PLT 263.0 11/29/2010   GLUCOSE 96 03/25/2007   ALT 26 03/25/2007   AST 22 03/25/2007   NA 142 03/25/2007   K 3.9 03/25/2007   CL 108 03/25/2007   CREATININE 1.0 03/25/2007   BUN 16 03/25/2007   CO2 27 03/25/2007   TSH 1.53 03/25/2007   Lab Results  Component Value Date   ESRSEDRATE 16 11/29/2010      Assessment & Plan:  Urticaria - allergic reaction NOS - no clear trigger but report hx same symptoms swallowing tightness but no angioedema on exam - Tx IM medrol + pred taper x 6 days Continue antihistamine and new rx for epipen to use prn

## 2012-06-19 ENCOUNTER — Telehealth: Payer: Self-pay | Admitting: Internal Medicine

## 2012-06-19 ENCOUNTER — Encounter: Payer: Self-pay | Admitting: Endocrinology

## 2012-06-19 ENCOUNTER — Ambulatory Visit (INDEPENDENT_AMBULATORY_CARE_PROVIDER_SITE_OTHER): Payer: BC Managed Care – PPO | Admitting: Endocrinology

## 2012-06-19 VITALS — BP 102/72 | HR 63 | Temp 97.9°F

## 2012-06-19 DIAGNOSIS — R220 Localized swelling, mass and lump, head: Secondary | ICD-10-CM

## 2012-06-19 DIAGNOSIS — R221 Localized swelling, mass and lump, neck: Secondary | ICD-10-CM

## 2012-06-19 MED ORDER — METHYLPREDNISOLONE (PAK) 4 MG PO TABS: ORAL_TABLET | ORAL | Status: AC

## 2012-06-19 NOTE — Progress Notes (Signed)
Subjective:    Patient ID: Joe Hawkins, male    DOB: 11-14-1961, 51 y.o.   MRN: [id number]  HPI Pt states few weeks of slight swelling of the face, and assoc nasal congestion.  sxs are similar to those he had when he saw dr Felicity Coyer, except he does not have itching this time.  He got only temporary relief from steroids.  He does not want to be referred to a specialist.   Past Medical History  Diagnosis Date  . GERD (gastroesophageal reflux disease)   . Sinusitis     Dr Pollyann Kennedy  . Allergic rhinitis   . Low back pain   . Tinnitus 2011    Past Surgical History  Procedure Date  . Nasal sinus surgery 2008    History   Social History  . Marital Status: Married    Spouse Name: N/A    Number of Children: N/A  . Years of Education: N/A   Occupational History  . jeweler    Social History Main Topics  . Smoking status: Former Games developer  . Smokeless tobacco: Not on file  . Alcohol Use: No  . Drug Use: No  . Sexually Active: Yes   Other Topics Concern  . Not on file   Social History Narrative   Married    Current Outpatient Prescriptions on File Prior to Visit  Medication Sig Dispense Refill  . Cholecalciferol (EQL VITAMIN D3) 1000 UNITS tablet Take 1,000 Units by mouth daily.        Marland Kitchen EPINEPHrine (EPIPEN) 0.3 mg/0.3 mL DEVI Inject 0.3 mLs (0.3 mg total) into the muscle once.  1 Device  0  . fluticasone (FLOVENT DISKUS) 50 MCG/BLIST diskus inhaler Inhale 1 puff into the lungs 2 (two) times daily.        Marland Kitchen HYDROcodone-acetaminophen (NORCO) 5-325 MG per tablet Take 1 tablet by mouth 2 (two) times daily as needed. Take 1-2 bid prn       . hydrOXYzine (ATARAX) 25 MG tablet Take 25 mg by mouth 2 (two) times daily as needed. Take 1-2 bid prn       . loratadine-pseudoephedrine (CLARITIN-D 24-HOUR) 10-240 MG per 24 hr tablet Take 1 tablet by mouth daily as needed for allergies (congestion).  30 tablet  11  . nabumetone (RELAFEN) 500 MG tablet Take 500 mg by mouth 2 (two) times daily.         . ranitidine (ZANTAC) 300 MG tablet TAKE 1 TABLET ONCE DAILY.  90 tablet  3  . rizatriptan (MAXALT-MLT) 10 MG disintegrating tablet Take 10 mg by mouth as needed. May repeat in 2 hours if needed       . triamcinolone cream (KENALOG) 0.5 % Apply topically 3 (three) times daily.  30 g  1    Allergies  Allergen Reactions  . Shellfish Allergy     Crab, lobsters and shrimp.     Family History  Problem Relation Age of Onset  . Cancer Neg Hx     BP 102/72  Pulse 63  Temp 97.9 F (36.6 C) (Oral)  SpO2 95%    Review of Systems Denies fever, but he has slight headache.      Objective:   Physical Exam VITAL SIGNS:  See vs page GENERAL: no distress head: no deformity eyes: no periorbital swelling, no proptosis external nose and ears are normal mouth: no lesion seen Both eac's and tm's are normal NECK: There is no palpable thyroid enlargement.  No thyroid nodule is palpable.  No palpable lymphadenopathy at the anterior neck.        Assessment & Plan:  Facial swelling, uncertain etiology.  Possible angioedema

## 2012-06-19 NOTE — Patient Instructions (Addendum)
i have sent a prescription to your pharmacy, for a steroid "pack." I hope you feel better soon.  If you don't feel better by next week, please call back.  Please call sooner if you get worse.

## 2012-06-19 NOTE — Telephone Encounter (Signed)
Caller: Joe Hawkins/Patient; PCP: Plotnikov, Alex; CB#: [phone]. Call regarding Refill of Medication. Caller asking for refill of medication he got at last appt. Caller unsure of medication but thinks it was an antibx. Caller advised a note will be sent for PCP. Denies further needs.  Upon clarification of spelling of his name with co-worker, Harriett Sine, she reports she made him an appt for today at 3pm. Caller advised to have him keep appt as scheduled.

## 2012-06-20 ENCOUNTER — Other Ambulatory Visit: Payer: Self-pay | Admitting: Internal Medicine

## 2012-06-20 NOTE — Telephone Encounter (Signed)
Ok to Rf? 

## 2012-06-21 DIAGNOSIS — R220 Localized swelling, mass and lump, head: Secondary | ICD-10-CM | POA: Insufficient documentation

## 2012-09-09 ENCOUNTER — Ambulatory Visit: Payer: BC Managed Care – PPO | Admitting: Endocrinology

## 2012-09-09 ENCOUNTER — Ambulatory Visit (INDEPENDENT_AMBULATORY_CARE_PROVIDER_SITE_OTHER): Payer: BC Managed Care – PPO | Admitting: Internal Medicine

## 2012-09-09 ENCOUNTER — Telehealth: Payer: Self-pay | Admitting: Internal Medicine

## 2012-09-09 ENCOUNTER — Encounter: Payer: Self-pay | Admitting: Internal Medicine

## 2012-09-09 VITALS — BP 110/82 | HR 81 | Temp 97.9°F

## 2012-09-09 DIAGNOSIS — Z23 Encounter for immunization: Secondary | ICD-10-CM

## 2012-09-09 DIAGNOSIS — H539 Unspecified visual disturbance: Secondary | ICD-10-CM

## 2012-09-09 DIAGNOSIS — H11009 Unspecified pterygium of unspecified eye: Secondary | ICD-10-CM

## 2012-09-09 DIAGNOSIS — J019 Acute sinusitis, unspecified: Secondary | ICD-10-CM

## 2012-09-09 DIAGNOSIS — H6690 Otitis media, unspecified, unspecified ear: Secondary | ICD-10-CM

## 2012-09-09 MED ORDER — LORATADINE-PSEUDOEPHEDRINE ER 10-240 MG PO TB24: 1.0000 | ORAL_TABLET | Freq: Every day | ORAL | Status: DC | PRN

## 2012-09-09 MED ORDER — PREDNISONE 10 MG PO TABS: ORAL_TABLET | ORAL | Status: DC

## 2012-09-09 MED ORDER — AMOXICILLIN-POT CLAVULANATE 875-125 MG PO TABS: 1.0000 | ORAL_TABLET | Freq: Two times a day (BID) | ORAL | Status: DC

## 2012-09-09 NOTE — Assessment & Plan Note (Signed)
10/13 likely related to migraine aura vs other Ophth consult

## 2012-09-09 NOTE — Assessment & Plan Note (Signed)
Augmentin Prednisone 10 mg: take 4 tabs a day x 3 days; then 3 tabs a day x 4 days; then 2 tabs a day x 4 days, then 1 tab a day x 6 days, then stop. Take pc. Claritin D

## 2012-09-09 NOTE — Telephone Encounter (Signed)
ER CALL. Caller: Nancy/Other; Patient Name: Joe Hawkins; PCP: Plotnikov, Alex (Adults only); Best Callback Phone Number: [phone].  Call regarding Co-worker calling for PT, non-english speaking.  Co-worker states, Pt's face is swollen w/ trouble swallowing, fever and Body Aches. Pt not w/ Caller for complete triage.  Appt scheduled at 1615 on 10-22 due to Pt's work schedule and facial swelling w/ trouble swallowing reported.  Co-worker verbalized understanding.

## 2012-09-09 NOTE — Assessment & Plan Note (Signed)
Rx: see above

## 2012-09-09 NOTE — Progress Notes (Signed)
Patient ID: Joe Hawkins, male   DOB: 04/23/1961, 51 y.o.   MRN: [id number]  Subjective:    Patient ID: Joe Hawkins, male    DOB: [birth year], 51 y.o.   MRN: [id number]  HPI  C/o L ear pain and sinusitis x 1 wk C/o L eye growth, hurts and bothering him C/o seeing spots at times, occasional HAs  Wt Readings from Last 3 Encounters:  05/21/12 218 lb 12.8 oz (99.247 kg)  05/07/12 221 lb (100.245 kg)  03/14/12 221 lb 2 oz (100.302 kg)   BP Readings from Last 3 Encounters:  09/09/12 110/82  06/19/12 102/72  05/21/12 102/62      Review of Systems  Constitutional: Positive for chills.  HENT: Positive for congestion, rhinorrhea and sinus pressure.   Eyes: Positive for pain, redness, itching and visual disturbance.  Respiratory: Negative for shortness of breath.   Cardiovascular: Negative for chest pain.  Genitourinary: Negative for flank pain.  Neurological: Positive for headaches. Negative for tremors and weakness.  Psychiatric/Behavioral: The patient is not nervous/anxious.        Objective:   Physical Exam  Constitutional: He appears well-developed and well-nourished. No distress.  HENT:  Right Ear: External ear normal.  Mouth/Throat: Oropharynx is clear and moist. No oropharyngeal exudate.       L TM is red Nasal mucosa is swollen  Eyes:       L eye pterygium  Neck: No tracheal deviation present. No thyromegaly present.  Cardiovascular: Normal rate.   No murmur heard. Pulmonary/Chest: He has no wheezes. He has no rales.  Abdominal: He exhibits no distension.  Musculoskeletal: He exhibits no tenderness.  Lymphadenopathy:    He has no cervical adenopathy.  Neurological: He exhibits normal muscle tone. Coordination normal.  Skin: No rash noted. No erythema.  Psychiatric: He has a normal mood and affect. His behavior is normal. Judgment and thought content normal.  L eye pterygium        Assessment & Plan:

## 2012-09-09 NOTE — Assessment & Plan Note (Signed)
10/13 L eye - symptomatic Ophth consult

## 2012-09-10 ENCOUNTER — Ambulatory Visit: Payer: BC Managed Care – PPO | Admitting: Internal Medicine

## 2012-09-10 NOTE — Addendum Note (Signed)
Addended by: Carin Primrose on: 09/10/2012 09:16 AM   Modules accepted: Orders

## 2012-10-29 ENCOUNTER — Encounter: Payer: Self-pay | Admitting: Internal Medicine

## 2012-10-29 ENCOUNTER — Ambulatory Visit (INDEPENDENT_AMBULATORY_CARE_PROVIDER_SITE_OTHER): Payer: BC Managed Care – PPO | Admitting: Internal Medicine

## 2012-10-29 VITALS — BP 120/80 | HR 80 | Temp 97.7°F | Resp 16 | Wt 223.0 lb

## 2012-10-29 DIAGNOSIS — J069 Acute upper respiratory infection, unspecified: Secondary | ICD-10-CM | POA: Insufficient documentation

## 2012-10-29 DIAGNOSIS — M545 Low back pain, unspecified: Secondary | ICD-10-CM

## 2012-10-29 MED ORDER — DOXYCYCLINE HYCLATE 50 MG PO CAPS: 50.0000 mg | ORAL_CAPSULE | Freq: Two times a day (BID) | ORAL | Status: DC

## 2012-10-29 MED ORDER — NABUMETONE 500 MG PO TABS: 500.0000 mg | ORAL_TABLET | Freq: Two times a day (BID) | ORAL | Status: DC | PRN

## 2012-10-29 MED ORDER — PREDNISONE 10 MG PO TABS: ORAL_TABLET | ORAL | Status: DC

## 2012-10-29 MED ORDER — HYDROCODONE-ACETAMINOPHEN 5-325 MG PO TABS: 1.0000 | ORAL_TABLET | Freq: Two times a day (BID) | ORAL | Status: DC | PRN

## 2012-10-29 NOTE — Assessment & Plan Note (Signed)
Strep test 

## 2012-10-29 NOTE — Assessment & Plan Note (Signed)
See meds 

## 2012-10-29 NOTE — Progress Notes (Signed)
   Subjective:    Patient ID: Joe Hawkins, male    DOB: [birth year], 51 y.o.   MRN: [id number]  Sore Throat  This is a new problem. The current episode started yesterday. The pain is at a severity of 8/10. The pain is severe. Associated symptoms include congestion, ear pain and headaches. Pertinent negatives include no shortness of breath.  Otalgia  Associated symptoms include headaches and rhinorrhea.  Back Pain This is a recurrent problem. The pain is at a severity of 8/10. The pain is moderate. Associated symptoms include headaches. Pertinent negatives include no chest pain or weakness.   C/o ST x 2 d  Wt Readings from Last 3 Encounters:  10/29/12 223 lb (101.152 kg)  05/21/12 218 lb 12.8 oz (99.247 kg)  05/07/12 221 lb (100.245 kg)   BP Readings from Last 3 Encounters:  10/29/12 120/80  09/09/12 110/82  06/19/12 102/72      Review of Systems  Constitutional: Positive for chills.  HENT: Positive for ear pain, congestion, rhinorrhea and sinus pressure.   Eyes: Positive for pain, redness, itching and visual disturbance.  Respiratory: Negative for shortness of breath.   Cardiovascular: Negative for chest pain.  Genitourinary: Negative for flank pain.  Musculoskeletal: Positive for back pain.  Neurological: Positive for headaches. Negative for tremors and weakness.  Psychiatric/Behavioral: The patient is not nervous/anxious.        Objective:   Physical Exam  Constitutional: He appears well-developed and well-nourished. No distress.  HENT:  Right Ear: External ear normal.  Mouth/Throat: Oropharynx is clear and moist. No oropharyngeal exudate.       L TM is red Nasal mucosa is swollen  Eyes:       L eye pterygium  Neck: No tracheal deviation present. No thyromegaly present.  Cardiovascular: Normal rate.   No murmur heard. Pulmonary/Chest: He has no wheezes. He has no rales.  Abdominal: He exhibits no distension.  Musculoskeletal: He exhibits no tenderness.   Lymphadenopathy:    He has no cervical adenopathy.  Neurological: He exhibits normal muscle tone. Coordination normal.  Skin: No rash noted. No erythema.  Psychiatric: He has a normal mood and affect. His behavior is normal. Judgment and thought content normal.  Eryth throat        Assessment & Plan:

## 2012-11-30 ENCOUNTER — Other Ambulatory Visit: Payer: Self-pay | Admitting: Internal Medicine

## 2013-11-11 ENCOUNTER — Encounter: Payer: Self-pay | Admitting: Internal Medicine

## 2013-11-11 ENCOUNTER — Ambulatory Visit (INDEPENDENT_AMBULATORY_CARE_PROVIDER_SITE_OTHER): Payer: BC Managed Care – PPO | Admitting: Internal Medicine

## 2013-11-11 VITALS — BP 102/70 | HR 71 | Temp 98.1°F | Ht 70.0 in | Wt 215.2 lb

## 2013-11-11 DIAGNOSIS — L299 Pruritus, unspecified: Secondary | ICD-10-CM | POA: Insufficient documentation

## 2013-11-11 DIAGNOSIS — J209 Acute bronchitis, unspecified: Secondary | ICD-10-CM | POA: Insufficient documentation

## 2013-11-11 DIAGNOSIS — Z23 Encounter for immunization: Secondary | ICD-10-CM

## 2013-11-11 DIAGNOSIS — J309 Allergic rhinitis, unspecified: Secondary | ICD-10-CM

## 2013-11-11 MED ORDER — HYDROCODONE-HOMATROPINE 5-1.5 MG/5ML PO SYRP: 5.0000 mL | ORAL_SOLUTION | Freq: Four times a day (QID) | ORAL | Status: DC | PRN

## 2013-11-11 MED ORDER — LEVOFLOXACIN 250 MG PO TABS: 250.0000 mg | ORAL_TABLET | Freq: Every day | ORAL | Status: DC

## 2013-11-11 MED ORDER — HYDROXYZINE HCL 25 MG PO TABS: 25.0000 mg | ORAL_TABLET | Freq: Two times a day (BID) | ORAL | Status: DC | PRN

## 2013-11-11 NOTE — Assessment & Plan Note (Signed)
Mild to mod, for antibx course,  to f/u any worsening symptoms or concerns 

## 2013-11-11 NOTE — Progress Notes (Signed)
Pre-visit discussion using our clinic review tool. No additional management support is needed unless otherwise documented below in the visit note.  

## 2013-11-11 NOTE — Assessment & Plan Note (Signed)
Ok for repeat vistaril prn,  to f/u any worsening symptoms or concerns, to try eucerin cream as well prn

## 2013-11-11 NOTE — Patient Instructions (Signed)
Please take all new medication as prescribed - the antibiotic, cough medicine, and itching medicine Please continue all other medications as before  Please remember to sign up for My Chart if you have not done so, as this will be important to you in the future with finding out test results, communicating by private email, and scheduling acute appointments online when needed.

## 2013-11-11 NOTE — Progress Notes (Signed)
Subjective:    Patient ID: Joe Hawkins, male    DOB: [birth year], 52 y.o.   MRN: [id number]  HPI  Here with acute onset mild to mod 2-3 days ST, HA, general weakness and malaise, with prod cough greenish sputum, but Pt denies chest pain, increased sob or doe, wheezing, orthopnea, PND, increased LE swelling, palpitations, dizziness or syncope.  Has ongoing itching without rash  Or juandice to anterior torso, has several excoriations, worse in the past wk again, asks for vistaril as before since this helped.  Also taking loratidine-D otc as this has helped with sinus congestion and allergies Past Medical History  Diagnosis Date  . GERD (gastroesophageal reflux disease)   . Sinusitis     Dr Pollyann Kennedy  . Allergic rhinitis   . Low back pain   . Tinnitus 2011   Past Surgical History  Procedure Laterality Date  . Nasal sinus surgery  2008    reports that he has quit smoking. He does not have any smokeless tobacco history on file. He reports that he does not drink alcohol or use illicit drugs. family history is negative for Cancer. Allergies  Allergen Reactions  . Shellfish Allergy     Crab, lobsters and shrimp.    Current Outpatient Prescriptions on File Prior to Visit  Medication Sig Dispense Refill  . Cholecalciferol (EQL VITAMIN D3) 1000 UNITS tablet Take 1,000 Units by mouth daily.        Marland Kitchen EPINEPHrine (EPIPEN) 0.3 mg/0.3 mL DEVI Inject 0.3 mLs (0.3 mg total) into the muscle once.  1 Device  0  . fluticasone (FLOVENT DISKUS) 50 MCG/BLIST diskus inhaler Inhale 1 puff into the lungs 2 (two) times daily.        Marland Kitchen HYDROcodone-acetaminophen (NORCO/VICODIN) 5-325 MG per tablet Take 1 tablet by mouth 2 (two) times daily as needed. Take 1-2 bid prn  60 tablet  1  . loratadine-pseudoephedrine (CLARITIN-D 24-HOUR) 10-240 MG per 24 hr tablet Take 1 tablet by mouth daily as needed for allergies (congestion).  30 tablet  3  . nabumetone (RELAFEN) 500 MG tablet Take 1 tablet (500 mg total) by mouth 2  (two) times daily as needed for pain.  60 tablet  3  . ranitidine (ZANTAC) 300 MG tablet TAKE 1 TABLET EACH DAY.  90 tablet  2  . rizatriptan (MAXALT-MLT) 10 MG disintegrating tablet Take 10 mg by mouth as needed. May repeat in 2 hours if needed       . triamcinolone cream (KENALOG) 0.5 % Apply topically 3 (three) times daily.  30 g  1   No current facility-administered medications on file prior to visit.   Review of Systems  Constitutional: Negative for unexpected weight change, or unusual diaphoresis  HENT: Negative for tinnitus.   Eyes: Negative for photophobia and visual disturbance.  Respiratory: Negative for choking and stridor.   Gastrointestinal: Negative for vomiting and blood in stool.  Genitourinary: Negative for hematuria and decreased urine volume.  Musculoskeletal: Negative for acute joint swelling Skin: Negative for color change and wound.  Neurological: Negative for tremors and numbness other than noted  Psychiatric/Behavioral: Negative for decreased concentration or  hyperactivity.       Objective:   Physical Exam BP 102/70  Pulse 71  Temp(Src) 98.1 F (36.7 C) (Oral)  Ht 5\' 10"  (1.778 m)  Wt 215 lb 4 oz (97.637 kg)  BMI 30.89 kg/m2  SpO2 95% VS noted, mild ill Constitutional: Pt appears well-developed and well-nourished.  HENT: Head: NCAT.  Right Ear: External ear normal.  Left Ear: External ear normal.  Eyes: Conjunctivae and EOM are normal. Pupils are equal, round, and reactive to light.  Bilat tm's with mild erythema.  Max sinus areas mild tender.  Pharynx with mild erythema, no exudate Neck: Normal range of motion. Neck supple.  Cardiovascular: Normal rate and regular rhythm.   Pulmonary/Chest: Effort normal and breath sounds normal.  - no rales or wheezing Abd:  Soft, NT, non-distended, + BS Neurological: Pt is alert. Not confused . Motor 5/5 Skin: Skin is warm. No erythema. No rash , several excoriatoins noted to rlq Psychiatric: Pt behavior is  normal. Thought content normal.     Assessment & Plan:

## 2013-11-11 NOTE — Assessment & Plan Note (Signed)
To continue the loratidine D otc prn as this seems adequate

## 2013-11-11 NOTE — Addendum Note (Signed)
Addended by: Scharlene Gloss B on: 11/11/2013 08:40 AM   Modules accepted: Orders

## 2013-12-29 ENCOUNTER — Encounter: Payer: Self-pay | Admitting: Internal Medicine

## 2013-12-29 ENCOUNTER — Ambulatory Visit (INDEPENDENT_AMBULATORY_CARE_PROVIDER_SITE_OTHER): Payer: BC Managed Care – PPO | Admitting: Internal Medicine

## 2013-12-29 VITALS — BP 118/80 | HR 72 | Temp 99.1°F | Resp 16 | Wt 217.0 lb

## 2013-12-29 DIAGNOSIS — J329 Chronic sinusitis, unspecified: Secondary | ICD-10-CM

## 2013-12-29 DIAGNOSIS — J309 Allergic rhinitis, unspecified: Secondary | ICD-10-CM

## 2013-12-29 DIAGNOSIS — J209 Acute bronchitis, unspecified: Secondary | ICD-10-CM

## 2013-12-29 MED ORDER — AMOXICILLIN-POT CLAVULANATE 875-125 MG PO TABS: 1.0000 | ORAL_TABLET | Freq: Two times a day (BID) | ORAL | Status: DC

## 2013-12-29 MED ORDER — HYDROCODONE-HOMATROPINE 5-1.5 MG/5ML PO SYRP: 5.0000 mL | ORAL_SOLUTION | Freq: Four times a day (QID) | ORAL | Status: DC | PRN

## 2013-12-29 MED ORDER — METHYLPREDNISOLONE ACETATE 80 MG/ML IJ SUSP
80.0000 mg | Freq: Once | INTRAMUSCULAR | Status: AC
  Administered 2013-12-29: 80 mg via INTRAMUSCULAR

## 2013-12-29 NOTE — Assessment & Plan Note (Signed)
Continue with current prescription therapy as reflected on the Med list.  

## 2013-12-29 NOTE — Progress Notes (Signed)
   Subjective:     Sore Throat  This is a new problem. The current episode started in the past 7 days. There has been no fever. The pain is at a severity of 7/10. The pain is severe. Associated symptoms include congestion, ear pain, a plugged ear sensation and swollen glands. Pertinent negatives include no ear discharge or shortness of breath. He has had no exposure to strep or mono.  Otalgia  Associated symptoms include rhinorrhea and a sore throat. Pertinent negatives include no ear discharge.  Sinusitis This is a new problem. The current episode started in the past 7 days. The problem has been gradually worsening since onset. There has been no fever. His pain is at a severity of 7/10. Associated symptoms include chills, congestion, ear pain, sinus pressure, a sore throat and swollen glands. Pertinent negatives include no shortness of breath.   C/o ST x 5 d  Wt Readings from Last 3 Encounters:  12/29/13 217 lb (98.431 kg)  11/11/13 215 lb 4 oz (97.637 kg)  10/29/12 223 lb (101.152 kg)   BP Readings from Last 3 Encounters:  12/29/13 118/80  11/11/13 102/70  10/29/12 120/80      Review of Systems  Constitutional: Positive for chills.  HENT: Positive for congestion, ear pain, rhinorrhea, sinus pressure and sore throat. Negative for ear discharge.   Eyes: Positive for pain, redness, itching and visual disturbance.  Respiratory: Negative for shortness of breath.   Genitourinary: Negative for flank pain.  Neurological: Negative for tremors.  Psychiatric/Behavioral: The patient is not nervous/anxious.        Objective:   Physical Exam  Constitutional: He appears well-developed and well-nourished. No distress.  HENT:  Right Ear: External ear normal.  Mouth/Throat: Oropharynx is clear and moist. No oropharyngeal exudate.  L TM is red Nasal mucosa is swollen  Eyes:  L eye pterygium  Neck: No tracheal deviation present. No thyromegaly present.  Cardiovascular: Normal rate.    No murmur heard. Pulmonary/Chest: He has no wheezes. He has no rales.  Abdominal: He exhibits no distension.  Musculoskeletal: He exhibits no tenderness.  Lymphadenopathy:    He has no cervical adenopathy.  Neurological: He exhibits normal muscle tone. Coordination normal.  Skin: No rash noted. No erythema.  Psychiatric: He has a normal mood and affect. His behavior is normal. Judgment and thought content normal.  Eryth throat        Assessment & Plan:

## 2013-12-29 NOTE — Progress Notes (Signed)
Pre visit review using our clinic review tool, if applicable. No additional management support is needed unless otherwise documented below in the visit note. 

## 2013-12-29 NOTE — Assessment & Plan Note (Signed)
See abx Depomedrol

## 2013-12-29 NOTE — Patient Instructions (Signed)
Use over-the-counter  "cold" medicines  such as "Tylenol cold" , "Advil cold",  "Mucinex" or" Mucinex D"  for cough and congestion.   Avoid decongestants if you have high blood pressure and use "Afrin" nasal spray for nasal congestion as directed instead. Use" Delsym" or" Robitussin" cough syrup varietis for cough.  You can use plain "Tylenol" or "Advil" for fever, chills and achyness.  Please, make an appointment if you are not better or if you're worse.  

## 2013-12-29 NOTE — Assessment & Plan Note (Signed)
Augmentin Depomedol 80 mg im Hycodan prn

## 2014-02-08 ENCOUNTER — Ambulatory Visit (INDEPENDENT_AMBULATORY_CARE_PROVIDER_SITE_OTHER): Payer: BC Managed Care – PPO | Admitting: Internal Medicine

## 2014-02-08 ENCOUNTER — Encounter: Payer: Self-pay | Admitting: Internal Medicine

## 2014-02-08 VITALS — BP 120/72 | HR 80 | Temp 98.4°F | Resp 16 | Wt 216.0 lb

## 2014-02-08 DIAGNOSIS — J329 Chronic sinusitis, unspecified: Secondary | ICD-10-CM

## 2014-02-08 DIAGNOSIS — J309 Allergic rhinitis, unspecified: Secondary | ICD-10-CM

## 2014-02-08 MED ORDER — LEVOFLOXACIN 500 MG PO TABS: 500.0000 mg | ORAL_TABLET | Freq: Every day | ORAL | Status: DC

## 2014-02-08 MED ORDER — METHYLPREDNISOLONE ACETATE 80 MG/ML IJ SUSP
80.0000 mg | Freq: Once | INTRAMUSCULAR | Status: AC
  Administered 2014-02-08: 80 mg via INTRAMUSCULAR

## 2014-02-08 NOTE — Assessment & Plan Note (Signed)
Levaquin x 14d

## 2014-02-08 NOTE — Progress Notes (Signed)
Pre visit review using our clinic review tool, if applicable. No additional management support is needed unless otherwise documented below in the visit note. 

## 2014-02-08 NOTE — Progress Notes (Signed)
Patient ID: Joe Hawkins, male   DOB: [birth year], 53 y.o.   MRN: [id number]   Subjective:     Sore Throat  This is a new problem. The current episode started in the past 7 days. There has been no fever. The pain is at a severity of 6/10. The pain is severe. Associated symptoms include congestion, ear pain, a plugged ear sensation and swollen glands. Pertinent negatives include no ear discharge or shortness of breath. He has had no exposure to strep or mono.  Otalgia  Associated symptoms include rhinorrhea and a sore throat. Pertinent negatives include no ear discharge.  Sinusitis This is a new problem. The current episode started in the past 7 days. The problem has been gradually worsening since onset. There has been no fever. His pain is at a severity of 7/10. Associated symptoms include chills, congestion, ear pain, sinus pressure, a sore throat and swollen glands. Pertinent negatives include no shortness of breath.   C/o ST x 5 d  Wt Readings from Last 3 Encounters:  02/08/14 216 lb (97.977 kg)  12/29/13 217 lb (98.431 kg)  11/11/13 215 lb 4 oz (97.637 kg)   BP Readings from Last 3 Encounters:  02/08/14 120/72  12/29/13 118/80  11/11/13 102/70      Review of Systems  Constitutional: Positive for chills.  HENT: Positive for congestion, ear pain, rhinorrhea, sinus pressure and sore throat. Negative for ear discharge.   Eyes: Positive for pain, redness, itching and visual disturbance.  Respiratory: Negative for shortness of breath.   Genitourinary: Negative for flank pain.  Neurological: Negative for tremors.  Psychiatric/Behavioral: The patient is not nervous/anxious.        Objective:   Physical Exam  Constitutional: He appears well-developed and well-nourished. No distress.  HENT:  Right Ear: External ear normal.  Mouth/Throat: Oropharynx is clear and moist. No oropharyngeal exudate.  L TM is red Nasal mucosa is swollen  Eyes:  L eye pterygium  Neck: No tracheal  deviation present. No thyromegaly present.  Cardiovascular: Normal rate.   No murmur heard. Pulmonary/Chest: He has no wheezes. He has no rales.  Abdominal: He exhibits no distension.  Musculoskeletal: He exhibits no tenderness.  Lymphadenopathy:    He has no cervical adenopathy.  Neurological: He exhibits normal muscle tone. Coordination normal.  Skin: No rash noted. No erythema.  Psychiatric: He has a normal mood and affect. His behavior is normal. Judgment and thought content normal.  Eryth throat        Assessment & Plan:

## 2014-02-08 NOTE — Assessment & Plan Note (Signed)
Worse Depomedrol 80 mg im

## 2014-02-11 ENCOUNTER — Telehealth: Payer: Self-pay | Admitting: Internal Medicine

## 2014-02-11 NOTE — Telephone Encounter (Signed)
Pt was prescribed medicine and given a shot this week for a cold and cough.  He is not better .  The medicine is not working.  Is there something else? Pharmacy is [facility] on [address].

## 2014-02-12 MED ORDER — PREDNISONE 10 MG PO TABS: ORAL_TABLET | ORAL | Status: DC

## 2014-02-12 NOTE — Telephone Encounter (Signed)
pls call in:  Prednisone 10 mg: take 4 tabs a day x 3 days; then 3 tabs a day x 4 days; then 2 tabs a day x 4 days, then 1 tab a day x 6 days, then stop. Take pc.  Thx!

## 2014-02-12 NOTE — Telephone Encounter (Signed)
Done. Pt informed.

## 2014-02-19 ENCOUNTER — Ambulatory Visit (INDEPENDENT_AMBULATORY_CARE_PROVIDER_SITE_OTHER): Payer: BC Managed Care – PPO | Admitting: Physician Assistant

## 2014-02-19 VITALS — BP 120/80 | HR 82 | Temp 98.0°F | Resp 16 | Ht 69.0 in | Wt 211.0 lb

## 2014-02-19 DIAGNOSIS — R51 Headache: Secondary | ICD-10-CM

## 2014-02-19 DIAGNOSIS — J309 Allergic rhinitis, unspecified: Secondary | ICD-10-CM

## 2014-02-19 MED ORDER — CETIRIZINE HCL 10 MG PO TABS: 10.0000 mg | ORAL_TABLET | Freq: Every day | ORAL | Status: DC

## 2014-02-19 MED ORDER — NAPROXEN 500 MG PO TABS: 500.0000 mg | ORAL_TABLET | Freq: Two times a day (BID) | ORAL | Status: DC

## 2014-02-19 MED ORDER — KETOROLAC TROMETHAMINE 60 MG/2ML IM SOLN
60.0000 mg | Freq: Once | INTRAMUSCULAR | Status: AC
  Administered 2014-02-19: 60 mg via INTRAMUSCULAR

## 2014-02-19 MED ORDER — FLUTICASONE PROPIONATE 50 MCG/ACT NA SUSP: 2.0000 | Freq: Every day | NASAL | Status: DC

## 2014-02-19 NOTE — Patient Instructions (Signed)
Use the fluticasone (Flonase) nasal spray 2 sprays each nostril twice daily for 1 week.  Then decrease to 2 sprays once daily.  Continue using this medication every day.  I think it will help your sinuses  Take the cetirizine (Zyrtec) by mouth once daily. You can take this every day as well  Take the naproxen (Naprosyn) twice daily with food if needed for headache  Please let me know if you are not feeling better or if you are getting worse     ???? fluticasone ( Flonase ) ?????? ???? 2 ???? ???? ?????????? ????????????? ?????????? 1 ???????? ????????? ????????? ?? 2 ???? ???? ????????????? ? ???? ??????? ???????? ????????? ????? ???????????? ?????? ???? ????????  ?? cetirizine ??? ( Zyrtec ) ??? ???? ???? ???????????? ????????? ???????????? ???? ?????  ?? naproxen ??? ( Naprosyn ) ?????????? ???????????? ????? ??????????????? ?????????? ???????  ??????????????????? ????????????? ??????? ??????????? ??????????????? ????????????? ?????????????? ?????????????  brae fluticasone ( Flonase ) chramouh banh 2 banh knea  chamnuon pir dng  chea riengrealthngai ronthochramouh 1  sa bta  . banteabmk katbanthoy tow 2 banh mtong  chea riengrealthngai  . bant  karobrae  thna nih  chea riengreal  thngai .  khnhom kitthea vea  nung chuoy ronth  robsa anak  yk cetirizine nih ( Zyrtec ) daoy meat mtong  riengrealthngai .  anak achy k  nih chea riengreal thngai  phng der  yk naproxen nih ( Naprosyn )  pir dng knong  muoyothngai cheamuoy ahar  brasenbae chabach  samreab kar chhukbeal   saumoanounhnhat aoy khnhom  doeng tha brasenbae  anak min  mean arommo  la brasaer cheang moun  ryy brasenbae anak  kampoungte ttuol ban  [place]

## 2014-02-19 NOTE — Progress Notes (Signed)
Subjective:    Patient ID: Joe Hawkins, male    DOB: 1961/02/15, 53 y.o.   MRN: [id number]  HPI   Joe Hawkins is a very pleasant 53 yr old male here with concern for illness.  He reports he has had a bad headache for 2 days.  The headache is frontal, right between his eyes.  He has not taken anything for pain relief.  He also has runny nose, sneezing, ear pressure.  No cough.  He does have some itchy, watery eyes as well.  No fever.  He is unsure if he has a history of allergies, but on chart review this seems to be an ongoing issue.  He was given a prednisone taper on 3/27 that should have lasted 17 days but reports that he ran out of medication 2 days ago.  There is a language barrier and pt does not read English at all - so he may not have understood how to take the medication.  He does not appear to be on any allergy medications.  He did have a sinus surgery in 2008  The headache is the worst symptom - he describes it as throbbing pain.  He denies neck pain, visual change.  Review of Systems  Constitutional: Negative for fever and chills.  HENT: Positive for congestion, ear pain (pressure), postnasal drip, rhinorrhea, sinus pressure and sneezing.   Respiratory: Negative for cough, shortness of breath and wheezing.   Cardiovascular: Negative.   Gastrointestinal: Positive for constipation.  Musculoskeletal: Negative.   Skin: Negative.   Neurological: Positive for headaches.       Objective:   Physical Exam  Vitals reviewed. Constitutional: He is oriented to person, place, and time. He appears well-developed and well-nourished. No distress.  HENT:  Head: Normocephalic and atraumatic.  Right Ear: Tympanic membrane and ear canal normal.  Left Ear: Tympanic membrane and ear canal normal.  Nose: Mucosal edema and rhinorrhea present. Right sinus exhibits maxillary sinus tenderness and frontal sinus tenderness. Left sinus exhibits maxillary sinus tenderness and frontal sinus tenderness.    Mouth/Throat: Uvula is midline, oropharynx is clear and moist and mucous membranes are normal.  Eyes: Conjunctivae are normal. No scleral icterus.  Neck: Neck supple.  Cardiovascular: Normal rate, regular rhythm and normal heart sounds.   Pulmonary/Chest: Effort normal and breath sounds normal. He has no wheezes. He has no rales.  Lymphadenopathy:    He has no cervical adenopathy.  Neurological: He is alert and oriented to person, place, and time.  Skin: Skin is warm and dry.  Psychiatric: He has a normal mood and affect. His behavior is normal.       Assessment & Plan:  Allergic rhinitis - Plan: fluticasone (FLONASE) 50 MCG/ACT nasal spray, cetirizine (ZYRTEC) 10 MG tablet  Headache(784.0) - Plan: ketorolac (TORADOL) injection 60 mg, naproxen (NAPROSYN) 500 MG tablet   Joe Hawkins is a very pleasant 53 yr old male here with frontal HA and allergic rhinitis.  I suspect that pt's HA is due to sinusitis/allergic rhinitis.  On chart review, he has been treated for sinus infections with antibiotics and steroids since February - finished last pred taper 2 days ago.  I am hesitant to use more systemic steroids.  Will start Flonase 2 sprays each nare BID x 1 wk then decrease to once daily.  Start zyrtec daily.  Toradol IM in clinic with improvement in HA.  Will use Naprosyn BID prn for HA.  If he is worsening or not improving he  will call me.  We may need to refer to ENT for further evaluation of chronic sinusitis  I have printed instructions in English and have tried to translate them to Joe Hawkins as well.  We discussed in detail twice how to take each medicine.    Pt to call or RTC if worsening or not improving  E. Natividad Brood MHS, PA-C [facility] 4/3/20156:49 PM

## 2014-10-31 ENCOUNTER — Ambulatory Visit (INDEPENDENT_AMBULATORY_CARE_PROVIDER_SITE_OTHER): Payer: BC Managed Care – PPO | Admitting: Physician Assistant

## 2014-10-31 VITALS — BP 104/70 | HR 66 | Temp 98.1°F | Resp 18 | Ht 68.5 in | Wt 217.8 lb

## 2014-10-31 DIAGNOSIS — R05 Cough: Secondary | ICD-10-CM

## 2014-10-31 DIAGNOSIS — Z23 Encounter for immunization: Secondary | ICD-10-CM

## 2014-10-31 DIAGNOSIS — R0981 Nasal congestion: Secondary | ICD-10-CM

## 2014-10-31 DIAGNOSIS — J329 Chronic sinusitis, unspecified: Secondary | ICD-10-CM

## 2014-10-31 DIAGNOSIS — R059 Cough, unspecified: Secondary | ICD-10-CM

## 2014-10-31 MED ORDER — BENZONATATE 100 MG PO CAPS: 100.0000 mg | ORAL_CAPSULE | Freq: Three times a day (TID) | ORAL | Status: AC | PRN

## 2014-10-31 MED ORDER — AMOXICILLIN 875 MG PO TABS: 875.0000 mg | ORAL_TABLET | Freq: Two times a day (BID) | ORAL | Status: DC

## 2014-10-31 MED ORDER — GUAIFENESIN ER 1200 MG PO TB12: 1.0000 | ORAL_TABLET | Freq: Two times a day (BID) | ORAL | Status: DC | PRN

## 2014-10-31 MED ORDER — HYDROCOD POLST-CHLORPHEN POLST 10-8 MG/5ML PO LQCR: 5.0000 mL | Freq: Two times a day (BID) | ORAL | Status: DC | PRN

## 2014-10-31 NOTE — Progress Notes (Signed)
MRN: [id number] DOB: 04-05-1961  Subjective:   Chief Complaint  Patient presents with  . Cough    runny nose  x 5 days  . Headache  . Immunizations    flu vaccine    Joe Hawkins is a 53 y.o. non-smoking male with a history of seasonal allergies presenting for 5 days of UR symptoms.  Illness began with rhinorrhea.  He used flonase which helped very little.  This progressed to ear-fullness, nasal congestion, nasal pressure, and productive cough.  The coughing increased to post-tussive emesis.  He denies fever, chills, sore throat, ear drainage, or n/v and diarrhea.  He has been drinking water.  He has been having associated spots at eyes.  He notes that he has been taking the zyrtec regularly which has been preventing nasal congestion, but over the last 5 days it has gotten better.  He was seeing spots associated with the sinus pressure, but this has since resolved.  Denies any other aggravating or relieving factors, no other questions or concerns.  Joe Hawkins has a current medication list which includes the following prescription(s): cetirizine, epinephrine, fluticasone, ranitidine, and triamcinolone cream.  He is allergic to shellfish allergy.  Joe Hawkins  has a past medical history of GERD (gastroesophageal reflux disease); Sinusitis; Allergic rhinitis; Low back pain; and Tinnitus (2011). Also  has past surgical history that includes Nasal sinus surgery (2008).  Current Outpatient Prescriptions on File Prior to Visit  Medication Sig Dispense Refill  . cetirizine (ZYRTEC) 10 MG tablet Take 1 tablet (10 mg total) by mouth daily. 30 tablet 12  . EPINEPHrine (EPIPEN) 0.3 mg/0.3 mL DEVI Inject 0.3 mLs (0.3 mg total) into the muscle once. 1 Device 0  . fluticasone (FLONASE) 50 MCG/ACT nasal spray Place 2 sprays into both nostrils daily. 16 g 12  . ranitidine (ZANTAC) 300 MG tablet TAKE 1 TABLET EACH DAY. 90 tablet 2  . triamcinolone cream (KENALOG) 0.5 % Apply topically 3 (three) times daily. 30 g 1    No current facility-administered medications on file prior to visit.   Past Medical History  Diagnosis Date  . GERD (gastroesophageal reflux disease)   . Sinusitis     Dr Constance Holster  . Allergic rhinitis   . Low back pain   . Tinnitus 2011   History   Social History  . Marital Status: Married    Spouse Name: N/A    Number of Children: N/A  . Years of Education: N/A   Occupational History  . jeweler    Social History Main Topics  . Smoking status: Former Research scientist (life sciences)  . Smokeless tobacco: None  . Alcohol Use: No  . Drug Use: No  . Sexual Activity: Yes   Other Topics Concern  . None   Social History Narrative   Married   Family History  Problem Relation Age of Onset  . Cancer Neg Hx       ROS As in subjective.  Objective:   Vitals: BP 104/70 mmHg  Pulse 66  Temp(Src) 98.1 F (36.7 C) (Oral)  Resp 18  Ht 5' 8.5" (1.74 m)  Wt 217 lb 12.8 oz (98.793 kg)  BMI 32.63 kg/m2  SpO2 96%  Physical Exam  Constitutional: He is oriented to person, place, and time and well-developed, well-nourished, and in no distress. Vital signs are normal. No distress.  HENT:  Head: Normocephalic and atraumatic. Head is without raccoon's eyes.  Right Ear: Tympanic membrane is injected. A middle ear effusion is present.  Left Ear:  Tympanic membrane is not injected. A middle ear effusion is present.  Nose: Mucosal edema and rhinorrhea present. Right sinus exhibits frontal sinus tenderness. Right sinus exhibits no maxillary sinus tenderness. Left sinus exhibits frontal sinus tenderness. Left sinus exhibits no maxillary sinus tenderness.  Mouth/Throat: Oropharyngeal exudate and posterior oropharyngeal erythema (Mildly erythematous) present. No posterior oropharyngeal edema.  Eyes: Pupils are equal, round, and reactive to light. Right conjunctiva is injected (Mildly ). Left conjunctiva is injected (Mildly).  Neck: Normal range of motion. Neck supple. No thyromegaly present.  Cardiovascular:  Normal rate and regular rhythm.  Exam reveals no distant heart sounds and no friction rub.   No murmur heard. Pulmonary/Chest: No accessory muscle usage. No apnea. No respiratory distress. He has no decreased breath sounds.  Lymphadenopathy:       Head (right side): Tonsillar and preauricular adenopathy present. No submandibular adenopathy present.       Head (left side): Tonsillar and preauricular adenopathy present. No submandibular adenopathy present.       Right cervical: No superficial cervical and no posterior cervical adenopathy present.      Left cervical: No superficial cervical and no posterior cervical adenopathy present.       Right: No supraclavicular adenopathy present.       Left: No supraclavicular adenopathy present.  Neurological: He is alert and oriented to person, place, and time. Gait normal.     Assessment and Plan :  53 year old male is here today with concern of 5 days of nasal congestion, sinus pressure, and ear fullness. Continue taking the zyrtec for allergies, and flonase when sxatic discussed with patient.    Sinusitis, unspecified chronicity, unspecified location  amoxicillin (AMOXIL) 875 MG tablet, chlorpheniramine-HYDROcodone (TUSSIONEX PENNKINETIC ER) 10-8 MG/5ML LQCR, benzonatate (TESSALON) 100 MG capsule  Cough chlorpheniramine-HYDROcodone (TUSSIONEX PENNKINETIC ER) 10-8 MG/5ML LQCR, benzonatate (TESSALON) 100 MG capsule  Nasal congestion Guaifenesin (MUCINEX MAXIMUM STRENGTH) 1200 MG TB12  Need for prophylactic vaccination and inoculation against influenza  Flu Vaccine QUAD 36+ mos IM  Ivar Drape, PA-C [facility] 12/14/20157:17 PM

## 2014-10-31 NOTE — Patient Instructions (Addendum)
Please drink plenty of water and get rest. We are going to treat you like this is a virus.  Viruses last for 10 days.  You can take the mucinex, tussionex, and tessalon pearls.  If you do not feel better in 5 more days, then take the 2 tablets of the amoxicillin for the 10 days.  Continue to take the zyrtec.  In the future, take the flonase when you start to feel nasal congestion.

## 2015-04-06 ENCOUNTER — Ambulatory Visit (INDEPENDENT_AMBULATORY_CARE_PROVIDER_SITE_OTHER): Payer: BLUE CROSS/BLUE SHIELD | Admitting: Sports Medicine

## 2015-04-06 VITALS — BP 118/82 | HR 72 | Temp 98.2°F | Resp 16 | Ht 69.5 in | Wt 223.6 lb

## 2015-04-06 DIAGNOSIS — J301 Allergic rhinitis due to pollen: Secondary | ICD-10-CM

## 2015-04-06 DIAGNOSIS — J069 Acute upper respiratory infection, unspecified: Secondary | ICD-10-CM | POA: Diagnosis not present

## 2015-04-06 MED ORDER — CETIRIZINE HCL 10 MG PO TABS
10.0000 mg | ORAL_TABLET | Freq: Every day | ORAL | Status: DC
Start: 2015-04-06 — End: 2015-09-14

## 2015-04-06 MED ORDER — FLUTICASONE PROPIONATE 50 MCG/ACT NA SUSP: 2.0000 | Freq: Every day | NASAL | Status: DC

## 2015-04-06 NOTE — Progress Notes (Signed)
  Joe Hawkins - 54 y.o. male MRN [id number]  Date of birth: 1961-01-10  SUBJECTIVE:  Including CC & ROS.  Onset of symptoms: 3 Days Symptoms include: yes Nasal congestion, yes nasal drainage , color of drainage is greenish, yes sore throat, yes post nasal drip.  yes fullness in the ears , yes sinus pressure, yes headache, subjective fever, yes chills, yes bodyache, some fatigue,  yes cough, greenish mucous, yes SOB. no history of tobacco use, no history of asthma, no history of COPD, yes history of seasonal allergies. Denies Nausea, vomiting, diarrhea.  Appetite yes, and Drinking fluids Relieving factors: nothing Symptoms not improving but no worse Vital signs reviewed: Normal respirations, normal pulse ox, normal temperature, normal pulse  ROS:  Constitutional:  Mild subjective fever, some chills, some fatigue.  Respiratory:  Mild shortness of breath, mild cough, no wheezing Cardiovascular:  No palpitations, chest pain or syncope Gastrointestinal:  No nausea, no abdominal pain Review of systems otherwise negative except for what is stated in HPI  HISTORY: Past Medical, Surgical, Social, and Family History Reviewed & Updated per EMR. Pertinent Historical Findings include: Seasonal allgergies   PHYSICAL EXAM:  VS: BP:118/82 mmHg  HR:72bpm  TEMP:98.2 F (36.8 C)(Oral)  RESP:97 %  HT:5' 9.5" (176.5 cm)   WT:223 lb 9.6 oz (101.424 kg)  BMI:32.6 PHYSICAL EXAM: General:  Alert and oriented, No acute distress.   HENT:  Normocephalic, Oral mucosa is moist.  URI ENT: Eyes are equal and reactive to light, normal conjunctivae, normal hearing, mucous membrane is moist, no erythema, no exudate.  Bilateral ears have fluid with bulging but no erythema, TMs are intact.  Both nasal passages are inflamed, erythematous, with clear drainage and inflamed turbinate.  Sinus passages are tender to palpation.  Submandibular glands are fluctuant mobile and soft. Respiratory:  Lungs are clear to auscultation,  Respirations are non-labored, Symmetrical chest wall expansion.   Cardiovascular:  Normal rate, Regular rhythm, No murmur, Good pulses equal in all extremities, No edema.   Gastrointestinal:  Soft, Non-tender, Non-distended, Normal bowel sounds, No organomegaly.   Integumentary:  Warm, Dry, No rash.   Neurologic:  Alert, Oriented, No focal defects Psychiatric:  Cooperative, Appropriate mood & affect.    ASSESSMENT & PLAN:  URI Plan: I suspect the patient is suffering from a viral upper respiratory infection based on the short duration of the symptoms, non-productive cough, nasal congestion, clear pharynx, afebrile presentation with subjective fever, normal PO intake, and no significant signs of bacterial infection seen on physical exam. Recommendations for symptomatic relief with over-the-counter agents was recommended.  Patient was provided with a handout to guide them in choosing the appropriate over-the-counter agents for each of their symptoms. Patient was educated that they will benefit from symptomatic control with OTC decongestants, Tylenol or Motrin for fevers, saline nasal sprays, Plenty of fluids and rest, return if no better in 5-7 days, sooner if worse.

## 2015-05-05 ENCOUNTER — Ambulatory Visit (INDEPENDENT_AMBULATORY_CARE_PROVIDER_SITE_OTHER): Payer: BLUE CROSS/BLUE SHIELD | Admitting: Emergency Medicine

## 2015-05-05 VITALS — BP 116/74 | HR 66 | Temp 98.4°F | Resp 16 | Ht 68.5 in | Wt 223.4 lb

## 2015-05-05 DIAGNOSIS — S338XXA Sprain of other parts of lumbar spine and pelvis, initial encounter: Secondary | ICD-10-CM

## 2015-05-05 DIAGNOSIS — S39012A Strain of muscle, fascia and tendon of lower back, initial encounter: Secondary | ICD-10-CM

## 2015-05-05 MED ORDER — CYCLOBENZAPRINE HCL 10 MG PO TABS: 10.0000 mg | ORAL_TABLET | Freq: Three times a day (TID) | ORAL | Status: DC | PRN

## 2015-05-05 MED ORDER — NAPROXEN SODIUM 550 MG PO TABS: 550.0000 mg | ORAL_TABLET | Freq: Two times a day (BID) | ORAL | Status: DC

## 2015-05-05 MED ORDER — HYDROCODONE-ACETAMINOPHEN 5-325 MG PO TABS: 1.0000 | ORAL_TABLET | ORAL | Status: DC | PRN

## 2015-05-05 NOTE — Patient Instructions (Signed)
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
## 2015-05-07 ENCOUNTER — Encounter (HOSPITAL_COMMUNITY): Payer: Self-pay | Admitting: Nurse Practitioner

## 2015-05-07 ENCOUNTER — Emergency Department (HOSPITAL_COMMUNITY)
Admission: EM | Admit: 2015-05-07 | Discharge: 2015-05-07 | Disposition: A | Discharge status: Home / Self Care | Payer: BLUE CROSS/BLUE SHIELD | Attending: Emergency Medicine | Admitting: Emergency Medicine

## 2015-05-07 ENCOUNTER — Telehealth: Payer: Self-pay

## 2015-05-07 DIAGNOSIS — Z79899 Other long term (current) drug therapy: Secondary | ICD-10-CM | POA: Diagnosis not present

## 2015-05-07 DIAGNOSIS — M545 Low back pain: Secondary | ICD-10-CM | POA: Diagnosis present

## 2015-05-07 DIAGNOSIS — M5442 Lumbago with sciatica, left side: Secondary | ICD-10-CM | POA: Diagnosis not present

## 2015-05-07 DIAGNOSIS — Z8669 Personal history of other diseases of the nervous system and sense organs: Secondary | ICD-10-CM | POA: Diagnosis not present

## 2015-05-07 DIAGNOSIS — R200 Anesthesia of skin: Secondary | ICD-10-CM | POA: Diagnosis not present

## 2015-05-07 DIAGNOSIS — Z791 Long term (current) use of non-steroidal anti-inflammatories (NSAID): Secondary | ICD-10-CM | POA: Insufficient documentation

## 2015-05-07 DIAGNOSIS — K219 Gastro-esophageal reflux disease without esophagitis: Secondary | ICD-10-CM | POA: Insufficient documentation

## 2015-05-07 DIAGNOSIS — Z87891 Personal history of nicotine dependence: Secondary | ICD-10-CM | POA: Insufficient documentation

## 2015-05-07 MED ORDER — DIAZEPAM 5 MG PO TABS: 5.0000 mg | ORAL_TABLET | Freq: Two times a day (BID) | ORAL | Status: DC

## 2015-05-07 MED ORDER — DIAZEPAM 5 MG PO TABS
5.0000 mg | ORAL_TABLET | Freq: Once | ORAL | Status: AC
  Administered 2015-05-07: 5 mg via ORAL
  Filled 2015-05-07: qty 1

## 2015-05-07 MED ORDER — PREDNISONE 20 MG PO TABS: ORAL_TABLET | ORAL | Status: DC

## 2015-05-07 MED ORDER — KETOROLAC TROMETHAMINE 60 MG/2ML IM SOLN
60.0000 mg | Freq: Once | INTRAMUSCULAR | Status: AC
  Administered 2015-05-07: 60 mg via INTRAMUSCULAR
  Filled 2015-05-07: qty 2

## 2015-05-07 NOTE — Telephone Encounter (Signed)
Patient called and stated that he is not feeling better since his visit on Thursday, 05/05/15.  He would like to know if he can have a different prescription called in, or if he needs to RTC.  Please advise.  Thank you.  CB#: [phone]

## 2015-05-07 NOTE — Discharge Instructions (Signed)
Return to the emergency room with worsening of symptoms, new symptoms or with symptoms that are concerning , especially fevers, loss of control of bladder or bowels, numbness or tingling around genital region or anus, weakness. RICE: Rest, Ice (three cycles of 20 mins on, 100mns off at least twice a day), compression/brace, elevation. Heating pad works well for back pain. Ibuprofen 4016m(2 tablets 20043mevery 5-6 hours for 3-5 days. Valium and norco for back pain but make sure to take 4 hours apart.  Prednisone. Call to make follow up with orthopedist. Call for appointment. Number provided above. Read below information and follow recommendations.  RICE: Routine Care for Injuries The routine care of many injuries includes Rest, Ice, Compression, and Elevation (RICE). HOME CARE INSTRUCTIONS  Rest is needed to allow your body to heal. Routine activities can usually be resumed when comfortable. Injured tendons and bones can take up to 6 weeks to heal. Tendons are the cord-like structures that attach muscle to bone.  Ice following an injury helps keep the swelling down and reduces pain.  Put ice in a plastic bag.  Place a towel between your skin and the bag.  Leave the ice on for 15-20 minutes, 3-4 times a day, or as directed by your health care provider. Do this while awake, for the first 24 to 48 hours. After that, continue as directed by your caregiver.  Compression helps keep swelling down. It also gives support and helps with discomfort. If an elastic bandage has been applied, it should be removed and reapplied every 3 to 4 hours. It should not be applied tightly, but firmly enough to keep swelling down. Watch fingers or toes for swelling, bluish discoloration, coldness, numbness, or excessive pain. If any of these problems occur, remove the bandage and reapply loosely. Contact your caregiver if these problems continue.  Elevation helps reduce swelling and decreases pain. With extremities,  such as the arms, hands, legs, and feet, the injured area should be placed near or above the level of the heart, if possible. SEEK IMMEDIATE MEDICAL CARE IF:  You have persistent pain and swelling.  You develop redness, numbness, or unexpected weakness.  Your symptoms are getting worse rather than improving after several days. These symptoms may indicate that further evaluation or further X-rays are needed. Sometimes, X-rays may not show a small broken bone (fracture) until 1 week or 10 days later. Make a follow-up appointment with your caregiver. Ask when your X-ray results will be ready. Make sure you get your X-ray results. Document Released: 02/17/2001 Document Revised: 11/10/2013 Document Reviewed: 04/06/2011 [facility] Information 2015 [place] information is not intended to replace advice given to you by your health care provider. Make sure you discuss any questions you have with your health care provider.  Back Injury Prevention Back injuries can be extremely painful and difficult to heal. After having one back injury, you are much more likely to experience another later on. It is important to learn how to avoid injuring or re-injuring your back. The following tips can help you to prevent a back injury. PHYSICAL FITNESS  Exercise regularly and try to develop good tone in your abdominal muscles. Your abdominal muscles provide a lot of the support needed by your back.  Do aerobic exercises (walking, jogging, biking, swimming) regularly.  Do exercises that increase balance and strength (tai chi, yoga) regularly. This can decrease your risk of falling and injuring your back.  Stretch before and after exercising.  Maintain a healthy  weight. The more you weigh, the more stress is placed on your back. For every pound of weight, 10 times that amount of pressure is placed on the back. DIET  Talk to your caregiver about how much calcium and vitamin D you need per day.  These nutrients help to prevent weakening of the bones (osteoporosis). Osteoporosis can cause broken (fractured) bones that lead to back pain.  Include good sources of calcium in your diet, such as dairy products, green, leafy vegetables, and products with calcium added (fortified).  Include good sources of vitamin D in your diet, such as milk and foods that are fortified with vitamin D.  Consider taking a nutritional supplement or a multivitamin if needed.  Stop smoking if you smoke. POSTURE  Sit and stand up straight. Avoid leaning forward when you sit or hunching over when you stand.  Choose chairs with good low back (lumbar) support.  If you work at a desk, sit close to your work so you do not need to lean over. Keep your chin tucked in. Keep your neck drawn back and elbows bent at a right angle. Your arms should look like the letter "L."  Sit high and close to the steering wheel when you drive. Add a lumbar support to your car seat if needed.  Avoid sitting or standing in one position for too long. Take breaks to get up, stretch, and walk around at least once every hour. Take breaks if you are driving for long periods of time.  Sleep on your side with your knees slightly bent, or sleep on your back with a pillow under your knees. Do not sleep on your stomach. LIFTING, TWISTING, AND REACHING  Avoid heavy lifting, especially repetitive lifting. If you must do heavy lifting:  Stretch before lifting.  Work slowly.  Rest between lifts.  Use carts and dollies to move objects when possible.  Make several small trips instead of carrying 1 heavy load.  Ask for help when you need it.  Ask for help when moving big, awkward objects.  Follow these steps when lifting:  Stand with your feet shoulder-width apart.  Get as close to the object as you can. Do not try to pick up heavy objects that are far from your body.  Use handles or lifting straps if they are available.  Bend at  your knees. Squat down, but keep your heels off the floor.  Keep your shoulders pulled back, your chin tucked in, and your back straight.  Lift the object slowly, tightening the muscles in your legs, abdomen, and buttocks. Keep the object as close to the center of your body as possible.  When you put a load down, use these same guidelines in reverse.  Do not:  Lift the object above your waist.  Twist at the waist while lifting or carrying a load. Move your feet if you need to turn, not your waist.  Bend over without bending at your knees.  Avoid reaching over your head, across a table, or for an object on a high surface. OTHER TIPS  Avoid wet floors and keep sidewalks clear of ice to prevent falls.  Do not sleep on a mattress that is too soft or too hard.  Keep items that are used frequently within easy reach.  Put heavier objects on shelves at waist level and lighter objects on lower or higher shelves.  Find ways to decrease your stress, such as exercise, massage, or relaxation techniques. Stress can build up in  your muscles. Tense muscles are more vulnerable to injury.  Seek treatment for depression or anxiety if needed. These conditions can increase your risk of developing back pain. SEEK MEDICAL CARE IF:  You injure your back.  You have questions about diet, exercise, or other ways to prevent back injuries. MAKE SURE YOU:  Understand these instructions.  Will watch your condition.  Will get help right away if you are not doing well or get worse. Document Released: 12/13/2004 Document Revised: 01/28/2012 Document Reviewed: 12/17/2011 [facility] Patient Information 2015 [place], [place]. This information is not intended to replace advice given to you by your health care provider. Make sure you discuss any questions you have with your health care provider.

## 2015-05-07 NOTE — ED Provider Notes (Signed)
CSN: [id number]     Arrival date & time 05/07/15  5366 History  This chart was scribed for non-physician practitioner, Al Corpus, PA-C,working with Wandra Arthurs, MD, by Marlowe Kays, ED Scribe. This patient was seen in room TR05C/TR05C and the patient's care was started at 7:44 PM.  Chief Complaint  Patient presents with  . Back Pain   Patient is a 54 y.o. male presenting with back pain. The history is provided by the patient and medical records. No language interpreter was used.  Back Pain Associated symptoms: numbness   Associated symptoms: no abdominal pain and no weakness     HPI Comments:  Joe Hawkins is a 54 y.o. male who presents to the Emergency Department complaining of severe lower back pain that radiates down his left leg. He states he has had surgery on his back approximately nine years ago and has had pain since. He states he lifted a moderately heavy object four days ago and was seen at the urgent care and was prescribed Vicodin 5 mg 1 po q4h. He reports intermittent numbness of his left foot since the injury. Movements makes his pain worse. He denies any alleviating factors. Denies trauma or fall. Denies saddle anesthesia, bowel or bladder incontinence, abdominal pain, nausea, vomiting, tingling or weakness of the lower extremities. He states he has been ambulatory walks with a cane intermittently and has been using it since the incident.   Past Medical History  Diagnosis Date  . GERD (gastroesophageal reflux disease)   . Sinusitis     Dr Constance Holster  . Allergic rhinitis   . Low back pain   . Tinnitus 2011   Past Surgical History  Procedure Laterality Date  . Nasal sinus surgery  2008  . Back surgery  2007   Family History  Problem Relation Age of Onset  . Cancer Neg Hx    History  Substance Use Topics  . Smoking status: Former Research scientist (life sciences)  . Smokeless tobacco: Not on file  . Alcohol Use: No    Review of Systems  Gastrointestinal: Negative for nausea, vomiting and  abdominal pain.  Genitourinary:       No bowel or bladder incontinence No saddle anesthesia  Musculoskeletal: Positive for back pain.  Skin: Negative for color change and wound.  Neurological: Positive for numbness. Negative for weakness.    Allergies  Shellfish allergy  Home Medications   Prior to Admission medications   Medication Sig Start Date End Date Taking? Authorizing Provider  cetirizine (ZYRTEC) 10 MG tablet Take 1 tablet (10 mg total) by mouth daily. 04/06/15   Deanna M Didiano, DO  cyclobenzaprine (FLEXERIL) 10 MG tablet Take 1 tablet (10 mg total) by mouth 3 (three) times daily as needed for muscle spasms. 05/05/15   Roselee Culver, MD  diazepam (VALIUM) 5 MG tablet Take 1 tablet (5 mg total) by mouth 2 (two) times daily. 05/07/15   Al Corpus, PA-C  EPINEPHrine (EPIPEN) 0.3 mg/0.3 mL DEVI Inject 0.3 mLs (0.3 mg total) into the muscle once. 05/21/12   Rowe Clack, MD  fluticasone (FLONASE) 50 MCG/ACT nasal spray Place 2 sprays into both nostrils daily. 04/06/15   Deanna M Didiano, DO  HYDROcodone-acetaminophen (NORCO) 5-325 MG per tablet Take 1-2 tablets by mouth every 4 (four) hours as needed. 05/05/15   Roselee Culver, MD  nabumetone (RELAFEN) 500 MG tablet Take 500 mg by mouth daily.    Historical Provider, MD  naproxen sodium (ANAPROX DS) 550 MG tablet Take  1 tablet (550 mg total) by mouth 2 (two) times daily with a meal. 05/05/15 05/04/16  Roselee Culver, MD  predniSONE (DELTASONE) 20 MG tablet 2 tabs po daily x 5 days 05/07/15   [place], PA-C  ranitidine (ZANTAC) 300 MG tablet TAKE 1 TABLET EACH DAY. Patient not taking: Reported on 05/05/2015 11/30/12   Lew Dawes V, MD  triamcinolone cream (KENALOG) 0.5 % Apply topically 3 (three) times daily. 05/07/12 05/07/13  Janith Lima, MD   Triage Vitals: BP 121/77 mmHg  Pulse 72  Temp(Src) 97.6 F (36.4 C) (Oral)  Resp 18  SpO2 94% Physical Exam  Constitutional: He appears well-developed and  well-nourished. No distress.  HENT:  Head: Normocephalic and atraumatic.  Eyes: Conjunctivae are normal. Right eye exhibits no discharge. Left eye exhibits no discharge.  Cardiovascular: Normal rate, regular rhythm and normal heart sounds.   Pulmonary/Chest: Effort normal and breath sounds normal. No respiratory distress. He has no wheezes.  Abdominal: Soft. Bowel sounds are normal. He exhibits no distension. There is no tenderness.  Genitourinary:  External rectum without tenderness to palpation without visible fissures. Internal rectum with normal tone without masses or lesions. Hard stool in rectal vault. Stool brown. Peritoneal sensation intact. Nursing tech in room during exam.   Musculoskeletal:  No midline back tenderness, step off or crepitus. Mid left sided lower back tenderness. No CVA tenderness.  Neurological: He is alert. Coordination normal.  Equal muscle tone. 5/5 strength in lower extremities. DTR equal and intact. Positive straight leg test of LLE. Negative straight leg raise of RLE. Antalgic gait.  Skin: Skin is warm and dry. He is not diaphoretic.  Nursing note and vitals reviewed.   ED Course  Procedures (including critical care time) DIAGNOSTIC STUDIES: Oxygen Saturation is 94% on RA, adequate by my interpretation.   COORDINATION OF CARE: 7:57 PM- Will perform rectal exam and refer to orthopedist. Pt verbalizes understanding and agrees to plan.  Medications  ketorolac (TORADOL) injection 60 mg (60 mg Intramuscular Given 05/07/15 2001)  diazepam (VALIUM) tablet 5 mg (5 mg Oral Given 05/07/15 2001)    Labs Review Labs Reviewed - No data to display  Imaging Review No results found.   EKG Interpretation None      MDM   Final diagnoses:  Left-sided low back pain with left-sided sciatica   Patient presenting with left-sided back tenderness but no spine tenderness. Patient neurovascularly intact other than positive left straight leg raise. Patient likely with  element of sciatica. Patient ambulatory with intact sphincter tone and peritoneal sensation. I doubt cauda equina. Will add prednisone as well as Valium to patient's pain regimen. Patient instructed not to take Valium and Vicodin within 4 hours of each other. Patient  to follow-up with orthopedics.  Discussed return precautions with patient. Discussed all results and patient verbalizes understanding and agrees with plan.  I personally performed the services described in this documentation, which was scribed in my presence. The recorded information has been reviewed and is accurate.    Al Corpus, PA-C 05/07/15 [id number]  Wandra Arthurs, MD 05/07/15 2318

## 2015-05-07 NOTE — ED Notes (Signed)
He hurt his back lifting heavy boxes 5 days ago. He went to an urgent care and they gave him some medication to try for pain at home but it has not helped. The pain is radiating down his left leg.

## 2015-05-07 NOTE — Telephone Encounter (Signed)
Spoke with pt. He reports his back pain is worsening despite medications that were prescribed at visit on 6/16, including norco, flexeril, naprosyn and nabumetone. Pt states his left leg pain is severe and he is unable to walk. He denies new numbness, new weakness or incontinence of bowel or urine. He does take he has not been able to have a BM. I advised if he feels his symptoms are worsening he should RTC. We discussed red flag sx that would require him to go to the ED. He reports he will RTC 6/19 first thing in the morning.

## 2015-09-14 ENCOUNTER — Ambulatory Visit (INDEPENDENT_AMBULATORY_CARE_PROVIDER_SITE_OTHER): Payer: BLUE CROSS/BLUE SHIELD | Admitting: Family Medicine

## 2015-09-14 VITALS — BP 124/70 | HR 91 | Temp 98.1°F | Resp 14 | Ht 69.5 in | Wt 219.8 lb

## 2015-09-14 DIAGNOSIS — J329 Chronic sinusitis, unspecified: Secondary | ICD-10-CM | POA: Diagnosis not present

## 2015-09-14 DIAGNOSIS — J301 Allergic rhinitis due to pollen: Secondary | ICD-10-CM | POA: Diagnosis not present

## 2015-09-14 DIAGNOSIS — K5900 Constipation, unspecified: Secondary | ICD-10-CM

## 2015-09-14 LAB — POCT CBC
Granulocyte percent: 56.2 %G (ref 37–80)
HCT, POC: 46.1 % (ref 43.5–53.7)
Hemoglobin: 15.4 g/dL (ref 14.1–18.1)
LYMPH, POC: 2.7 (ref 0.6–3.4)
MCH, POC: 25.8 pg — AB (ref 27–31.2)
MCHC: 33.5 g/dL (ref 31.8–35.4)
MCV: 77.1 fL — AB (ref 80–97)
MID (cbc): 0.9 (ref 0–0.9)
MPV: 6.9 fL (ref 0–99.8)
POC Granulocyte: 4.6 (ref 2–6.9)
POC LYMPH %: 32.8 % (ref 10–50)
POC MID %: 11 % (ref 0–12)
Platelet Count, POC: 220 10*3/uL (ref 142–424)
RBC: 5.98 M/uL (ref 4.69–6.13)
RDW, POC: 12.9 %
WBC: 8.2 10*3/uL (ref 4.6–10.2)

## 2015-09-14 LAB — COMPREHENSIVE METABOLIC PANEL WITH GFR
ALT: 24 U/L (ref 9–46)
AST: 20 U/L (ref 10–35)
Albumin: 4.2 g/dL (ref 3.6–5.1)
Alkaline Phosphatase: 72 U/L (ref 40–115)
BUN: 15 mg/dL (ref 7–25)
CO2: 25 mmol/L (ref 20–31)
Calcium: 9.5 mg/dL (ref 8.6–10.3)
Chloride: 103 mmol/L (ref 98–110)
Creat: 0.82 mg/dL (ref 0.70–1.33)
Glucose, Bld: 90 mg/dL (ref 65–99)
Potassium: 4.1 mmol/L (ref 3.5–5.3)
Sodium: 140 mmol/L (ref 135–146)
Total Bilirubin: 0.4 mg/dL (ref 0.2–1.2)
Total Protein: 7.5 g/dL (ref 6.1–8.1)

## 2015-09-14 LAB — POC MICROSCOPIC URINALYSIS (UMFC)

## 2015-09-14 LAB — POCT URINALYSIS DIP (MANUAL ENTRY)
Bilirubin, UA: NEGATIVE
Glucose, UA: NEGATIVE
Leukocytes, UA: NEGATIVE
Nitrite, UA: NEGATIVE
Protein Ur, POC: NEGATIVE
Spec Grav, UA: 1.02
Urobilinogen, UA: 0.2
pH, UA: 6

## 2015-09-14 MED ORDER — AZITHROMYCIN 250 MG PO TABS: ORAL_TABLET | ORAL | Status: DC

## 2015-09-14 MED ORDER — HYDROCOD POLST-CPM POLST ER 10-8 MG/5ML PO SUER: 5.0000 mL | Freq: Every evening | ORAL | Status: DC | PRN

## 2015-09-14 MED ORDER — PREDNISONE 20 MG PO TABS: ORAL_TABLET | ORAL | Status: DC

## 2015-09-14 MED ORDER — FLUTICASONE PROPIONATE 50 MCG/ACT NA SUSP: 2.0000 | Freq: Every day | NASAL | Status: DC

## 2015-09-14 NOTE — Progress Notes (Signed)
Subjective:  This chart was scribed for [name] MD, by Tamsen Roers, at [facility].  This patient was seen in room  1 and the patient's care was started at 2:57 PM.   Chief Complaint  Patient presents with  . Cough    Productive, x 4 days,   . Headache  . Sore Throat  . Ear Fullness  . Nasal Congestion    Patient ID: Joe Hawkins, male    DOB: [birth year], 54 y.o.   MRN: [id number]  HPI  HPI Comments: Joe Hawkins is a 54 y.o. male who presents to the [facility] complaining of multiple symptoms including a productive cough (onset 4 days ago), headache, sore throat, ear fullness and nasal congestion.  He has associated symptoms of central sinus pain as well as ear pain and feels "full" as he has constipation and nausea. He denies any difficulty sleeping.  Patient has been using cough medicine but denies any relief.  He does not remember his last full physical or when he has his blood work done.  He has no other complaints or concerns today. His PCP is Dr. Alain Marion.    Patient Active Problem List   Diagnosis Date Noted  . Acute bronchitis 11/11/2013  . Itching 11/11/2013  . Visual disturbance 09/09/2012  . Pterygium eye 09/09/2012  . Facial swelling 06/21/2012  . Eczema 05/07/2012  . Constipation 04/27/2011  . LYMPHADENITIS 11/29/2010  . MIGRAINE HEADACHE 11/10/2010  . OTITIS MEDIA 04/28/2010  . TINNITUS 03/23/2010  . Meralgia paresthetica 02/10/2010  . NECK PAIN 09/01/2009  . TOBACCO USE, QUIT 09/01/2009  . GERD 12/08/2008  . SINUSITIS, CHRONIC 10/31/2007  . ALLERGIC RHINITIS 10/31/2007  . LOW BACK PAIN 10/31/2007   Past Medical History  Diagnosis Date  . GERD (gastroesophageal reflux disease)   . Sinusitis     Dr Constance Holster  . Allergic rhinitis   . Low back pain   . Tinnitus 2011   Past Surgical History  Procedure Laterality Date  . Nasal sinus surgery  2008  . Back surgery  2007   Allergies  Allergen Reactions  .  Shellfish Allergy     Crab, lobsters and shrimp.    Prior to Admission medications   Medication Sig Start Date End Date Taking? Authorizing Provider  diazepam (VALIUM) 5 MG tablet Take 1 tablet (5 mg total) by mouth 2 (two) times daily. 05/07/15  Yes Al Corpus, PA-C  EPINEPHrine (EPIPEN) 0.3 mg/0.3 mL DEVI Inject 0.3 mLs (0.3 mg total) into the muscle once. 05/21/12  Yes Rowe Clack, MD  HYDROcodone-acetaminophen (NORCO) 5-325 MG per tablet Take 1-2 tablets by mouth every 4 (four) hours as needed. 05/05/15  Yes Roselee Culver, MD  nabumetone (RELAFEN) 500 MG tablet Take 500 mg by mouth daily.   Yes Historical Provider, MD  naproxen sodium (ANAPROX DS) 550 MG tablet Take 1 tablet (550 mg total) by mouth 2 (two) times daily with a meal. 05/05/15 05/04/16 Yes Roselee Culver, MD  cetirizine (ZYRTEC) 10 MG tablet Take 1 tablet (10 mg total) by mouth daily. Patient not taking: Reported on 09/14/2015 04/06/15   Deanna M Didiano, DO  cyclobenzaprine (FLEXERIL) 10 MG tablet Take 1 tablet (10 mg total) by mouth 3 (three) times daily as needed for muscle spasms. Patient not taking: Reported on 09/14/2015 05/05/15   Roselee Culver, MD  fluticasone [facility]) 50 MCG/ACT nasal spray Place 2 sprays into both nostrils daily. Patient not taking:  Reported on 09/14/2015 04/06/15   Deanna M Didiano, DO  predniSONE (DELTASONE) 20 MG tablet 2 tabs po daily x 5 days Patient not taking: Reported on 09/14/2015 05/07/15   Al Corpus, PA-C  ranitidine (ZANTAC) 300 MG tablet TAKE 1 TABLET EACH DAY. Patient not taking: Reported on 05/05/2015 11/30/12   Lew Dawes V, MD  triamcinolone cream (KENALOG) 0.5 % Apply topically 3 (three) times daily. 05/07/12 05/07/13  Janith Lima, MD   Social History   Social History  . Marital Status: Married    Spouse Name: N/A  . Number of Children: N/A  . Years of Education: N/A   Occupational History  . jeweler    Social History Main Topics  . Smoking  status: Former Research scientist (life sciences)  . Smokeless tobacco: Not on file  . Alcohol Use: No  . Drug Use: No  . Sexual Activity: Yes   Other Topics Concern  . Not on file   Social History Narrative   Married     Review of Systems  Constitutional: Negative for fever and chills.  HENT: Positive for sinus pressure and sore throat.   Eyes: Negative for pain and redness.  Respiratory: Positive for cough. Negative for choking and shortness of breath.   Gastrointestinal: Positive for nausea, abdominal pain and constipation. Negative for vomiting.  Musculoskeletal: Negative for gait problem, neck pain and neck stiffness.  Skin: Negative for color change.  Neurological: Positive for headaches. Negative for syncope and speech difficulty.       Objective:   Physical Exam  Constitutional: He appears well-developed and well-nourished. No distress.  HENT:  Head: Normocephalic and atraumatic.  Left Tm and right tm injected with mid ear effusion. bulging in right ear as well.  Purulent rhinorrhea and erythema Erythema in oropharynx with post nasal drip.   Eyes: Pupils are equal, round, and reactive to light.  Neck: No thyromegaly present.  No supraclavicular or posterior cervical adenopathy.  Little cervical adenopathy.   Cardiovascular: Normal rate, regular rhythm, S1 normal, S2 normal and normal heart sounds.  Exam reveals no gallop and no friction rub.   No murmur heard. Pulmonary/Chest: Effort normal and breath sounds normal. No respiratory distress. He has no wheezes. He has no rales.  Abdominal: Bowel sounds are normal. There is CVA tenderness. There is negative Murphy's sign.  Generalized tenderness without rebound or guarding.   Lymphadenopathy:    He has cervical adenopathy.    Filed Vitals:   09/14/15 1439  BP: 124/70  Pulse: 91  Temp: 98.1 F (36.7 C)  TempSrc: Oral  Resp: 14  Height: 5' 9.5" (1.765 m)  Weight: 219 lb 12.8 oz (99.701 kg)  SpO2: 98%   Results for orders placed or  performed in visit on 09/14/15  POCT CBC  Result Value Ref Range   WBC 8.2 4.6 - 10.2 K/uL   Lymph, poc 2.7 0.6 - 3.4   POC LYMPH PERCENT 32.8 10 - 50 %L   MID (cbc) 0.9 0 - 0.9   POC MID % 11.0 0 - 12 %M   POC Granulocyte 4.6 2 - 6.9   Granulocyte percent 56.2 37 - 80 %G   RBC 5.98 4.69 - 6.13 M/uL   Hemoglobin 15.4 14.1 - 18.1 g/dL   HCT, POC 46.1 43.5 - 53.7 %   MCV 77.1 (A) 80 - 97 fL   MCH, POC 25.8 (A) 27 - 31.2 pg   MCHC 33.5 31.8 - 35.4 g/dL   RDW, POC 12.9 %  Platelet Count, POC 220 142 - 424 K/uL   MPV 6.9 0 - 99.8 fL  POCT urinalysis dipstick  Result Value Ref Range   Color, UA yellow yellow   Clarity, UA clear clear   Glucose, UA negative negative   Bilirubin, UA negative negative   Ketones, POC UA trace (5) (A) negative   Spec Grav, UA 1.020    Blood, UA small (A) negative   pH, UA 6.0    Protein Ur, POC negative negative   Urobilinogen, UA 0.2    Nitrite, UA Negative Negative   Leukocytes, UA Negative Negative  POCT Microscopic Urinalysis ([facility])  Result Value Ref Range   WBC,UR,HPF,POC None None WBC/hpf   RBC,UR,HPF,POC Few (A) None RBC/hpf   Bacteria None None, Too numerous to count   Mucus Present (A) Absent   Epithelial Cells, UR Per Microscopy Moderate (A) None, Too numerous to count cells/hpf         Assessment & Plan:   1. Chronic sinusitis, unspecified location   2. Constipation, unspecified constipation type   3. Seasonal allergic rhinitis due to pollen     Orders Placed This Encounter  Procedures  . Comprehensive metabolic panel  . POCT CBC  . POCT urinalysis dipstick  . POCT Microscopic Urinalysis ([facility])    Meds ordered this encounter  Medications  . predniSONE (DELTASONE) 20 MG tablet    Sig: 3 tabs po qd x2d, 2 TABS PO QD X 2D, 1 TAB PO QD X 2D    Dispense:  12 tablet    Refill:  0  . fluticasone (FLONASE) 50 MCG/ACT nasal spray    Sig: Place 2 sprays into both nostrils at bedtime.    Dispense:  16 g    Refill:  12  .  azithromycin (ZITHROMAX) 250 MG tablet    Sig: Take 2 tabs PO x 1 dose, then 1 tab PO QD x 4 days    Dispense:  6 tablet    Refill:  0  . chlorpheniramine-HYDROcodone (TUSSIONEX PENNKINETIC ER) 10-8 MG/5ML SUER    Sig: Take 5 mLs by mouth at bedtime as needed for cough.    Dispense:  90 mL    Refill:  0    I personally performed the services described in this documentation, which was scribed in my presence. The recorded information has been reviewed and considered, and addended by me as needed.  [name], MD MPH

## 2015-09-14 NOTE — Patient Instructions (Signed)

## 2015-10-03 ENCOUNTER — Ambulatory Visit (INDEPENDENT_AMBULATORY_CARE_PROVIDER_SITE_OTHER): Payer: BLUE CROSS/BLUE SHIELD | Admitting: Family Medicine

## 2015-10-03 VITALS — BP 126/80 | HR 84 | Temp 98.2°F | Resp 18 | Ht 70.0 in | Wt 225.8 lb

## 2015-10-03 DIAGNOSIS — H9201 Otalgia, right ear: Secondary | ICD-10-CM | POA: Diagnosis not present

## 2015-10-03 DIAGNOSIS — K1120 Sialoadenitis, unspecified: Secondary | ICD-10-CM

## 2015-10-03 DIAGNOSIS — J0110 Acute frontal sinusitis, unspecified: Secondary | ICD-10-CM | POA: Diagnosis not present

## 2015-10-03 MED ORDER — AMOXICILLIN-POT CLAVULANATE 875-125 MG PO TABS: 1.0000 | ORAL_TABLET | Freq: Two times a day (BID) | ORAL | Status: DC

## 2015-10-03 MED ORDER — DICLOFENAC SODIUM 75 MG PO TBEC: 75.0000 mg | DELAYED_RELEASE_TABLET | Freq: Two times a day (BID) | ORAL | Status: DC

## 2015-10-03 NOTE — Progress Notes (Signed)
Patient ID: Joe Hawkins, male    DOB: 17-Apr-1961  Age: 54 y.o. MRN: [id number]  Chief Complaint  Patient presents with  . Ear Pain     difficult to swallow, right side, meds refill, polyethylene glucol     Subjective:   54 year old [place] American gentleman who is here complaining of pain in his right ear for the last couple of days. He has had some headache and maybe some fever. He has not had these problems before. Does not know what brought it on. He has had some congestion and drainage. He is not currently employed, formerly having worked as a Archivist.  Current allergies, medications, problem list, past/family and social histories reviewed.  Objective:  BP 126/80 mmHg  Pulse 84  Temp(Src) 98.2 F (36.8 C) (Oral)  Resp 18  Ht 5\' 10"  (1.778 m)  Wt 225 lb 12.8 oz (102.422 kg)  BMI 32.40 kg/m2  SpO2 97%  Heavyset man in no acute distress, alert and oriented. His TMs appear entirely normal. The throat is not erythematous. Teeth look good. He has some tenderness over his right frontal sinus. Is tender in the right parotid region which feels a little swollen. I could not tell whether it was the parotid or a gland just proximal to it that was most tender. Neck supple.  Assessment & Plan:   Assessment: 1. Parotitis   2. Otalgia of right ear   3. Acute frontal sinusitis, recurrence not specified       Plan: Will treat empirically and he is to come back if it is getting at all worse.  No orders of the defined types were placed in this encounter.    Meds ordered this encounter  Medications  . amoxicillin-clavulanate (AUGMENTIN) 875-125 MG tablet    Sig: Take 1 tablet by mouth 2 (two) times daily.    Dispense:  20 tablet    Refill:  0  . diclofenac (VOLTAREN) 75 MG EC tablet    Sig: Take 1 tablet (75 mg total) by mouth 2 (two) times daily.    Dispense:  20 tablet    Refill:  0         Patient Instructions  Take Augmentin 875 (amoxicillin/clavulanate) 1 pill twice  daily for infection  Take the diclofenac 75 mg 1 twice daily for pain and inflammation  Suck on some lemon 5 or 6 times a day.  For your constipation and bowels you can take over-the-counter MiraLAX which is the same as what you have been taking. It comes as a powder that you can store in a glass of water and drink down every day.  Return if not improving or if worse at any time.  Parotitis Parotitis is soreness and inflammation of one or both parotid glands. The parotid glands produce saliva. They are located on each side of the face, below and in front of the earlobes. The saliva produced comes out of tiny openings (ducts) inside the cheeks. In most cases, parotitis goes away over time or with treatment. If your parotitis is caused by certain long-term (chronic) diseases, it may come back again.  CAUSES  Parotitis can be caused by:  Viral infections. Mumps is one viral infection that can cause parotitis.  Bacterial infections.  Blockage of the salivary ducts due to a salivary stone.  Narrowing of the salivary ducts.  Swelling of the salivary ducts.  Dehydration.  Autoimmune conditions, such as sarcoidosis or Sjogren syndrome.  Air from activities such as scuba diving, glass blowing,  or playing an instrument (rare).  Human immunodeficiency virus (HIV) or acquired immunodeficiency syndrome (AIDS).  Tuberculosis. SIGNS AND SYMPTOMS   The ears may appear to be pushed up and out from their normal position.  Redness (erythema) of the skin over the parotid glands.  Pain and tenderness over the parotid glands.  Swelling in the parotid gland area.  Yellowish-white fluid (pus) coming from the ducts inside the cheeks.  Dry mouth.  Bad taste in the mouth. DIAGNOSIS  Your health care provider may determine that you have parotitis based on your symptoms and a physical exam. A sample of fluid may also be taken from the parotid gland and tested to find the cause of your infection.  X-rays or computed tomography (CT) scans may be taken if your health care provider thinks you might have a salivary stone blocking your salivary duct. TREATMENT  Treatment varies depending upon the cause of your parotitis. If your parotitis is caused by mumps, no treatment is needed. The condition will go away on its own after 7 to 10 days. In other cases, treatment may include:  Antibiotic medicine if your infection was caused by bacteria.  Pain medicines.  Gland massage.  Eating sour candy to increase your saliva production.  Removal of salivary stones. Your health care provider may flush stones out with fluids or remove them with tweezers.  Surgery to remove the parotid glands. HOME CARE INSTRUCTIONS   If you were prescribed an antibiotic medicine, finish it all even if you start to feel better.  Put warm compresses on the sore area.  Take medicines only as directed by your health care provider.  Drink enough fluids to keep your urine clear or pale yellow. SEEK IMMEDIATE MEDICAL CARE IF:   You have increasing pain or swelling that is not controlled with medicine.  You have a fever. MAKE SURE YOU:  Understand these instructions.  Will watch your condition.  Will get help right away if you are not doing well or get worse.   This information is not intended to replace advice given to you by your health care provider. Make sure you discuss any questions you have with your health care provider.   Document Released: 04/27/2002 Document Revised: 11/26/2014 Document Reviewed: 03/31/2015 Elsevier Interactive Patient Education 2016 [employer].      No Follow-up on file.   HOPPER,DAVID, MD 10/03/2015

## 2015-10-03 NOTE — Patient Instructions (Signed)
Take Augmentin 875 (amoxicillin/clavulanate) 1 pill twice daily for infection  Take the diclofenac 75 mg 1 twice daily for pain and inflammation  Suck on some lemon 5 or 6 times a day.  For your constipation and bowels you can take over-the-counter MiraLAX which is the same as what you have been taking. It comes as a powder that you can store in a glass of water and drink down every day.  Return if not improving or if worse at any time.  Parotitis Parotitis is soreness and inflammation of one or both parotid glands. The parotid glands produce saliva. They are located on each side of the face, below and in front of the earlobes. The saliva produced comes out of tiny openings (ducts) inside the cheeks. In most cases, parotitis goes away over time or with treatment. If your parotitis is caused by certain long-term (chronic) diseases, it may come back again.  CAUSES  Parotitis can be caused by:  Viral infections. Mumps is one viral infection that can cause parotitis.  Bacterial infections.  Blockage of the salivary ducts due to a salivary stone.  Narrowing of the salivary ducts.  Swelling of the salivary ducts.  Dehydration.  Autoimmune conditions, such as sarcoidosis or Sjogren syndrome.  Air from activities such as scuba diving, glass blowing, or playing an instrument (rare).  Human immunodeficiency virus (HIV) or acquired immunodeficiency syndrome (AIDS).  Tuberculosis. SIGNS AND SYMPTOMS   The ears may appear to be pushed up and out from their normal position.  Redness (erythema) of the skin over the parotid glands.  Pain and tenderness over the parotid glands.  Swelling in the parotid gland area.  Yellowish-white fluid (pus) coming from the ducts inside the cheeks.  Dry mouth.  Bad taste in the mouth. DIAGNOSIS  Your health care provider may determine that you have parotitis based on your symptoms and a physical exam. A sample of fluid may also be taken from the  parotid gland and tested to find the cause of your infection. X-rays or computed tomography (CT) scans may be taken if your health care provider thinks you might have a salivary stone blocking your salivary duct. TREATMENT  Treatment varies depending upon the cause of your parotitis. If your parotitis is caused by mumps, no treatment is needed. The condition will go away on its own after 7 to 10 days. In other cases, treatment may include:  Antibiotic medicine if your infection was caused by bacteria.  Pain medicines.  Gland massage.  Eating sour candy to increase your saliva production.  Removal of salivary stones. Your health care provider may flush stones out with fluids or remove them with tweezers.  Surgery to remove the parotid glands. HOME CARE INSTRUCTIONS   If you were prescribed an antibiotic medicine, finish it all even if you start to feel better.  Put warm compresses on the sore area.  Take medicines only as directed by your health care provider.  Drink enough fluids to keep your urine clear or pale yellow. SEEK IMMEDIATE MEDICAL CARE IF:   You have increasing pain or swelling that is not controlled with medicine.  You have a fever. MAKE SURE YOU:  Understand these instructions.  Will watch your condition.  Will get help right away if you are not doing well or get worse.   This information is not intended to replace advice given to you by your health care provider. Make sure you discuss any questions you have with your health care provider.  Document Released: 04/27/2002 Document Revised: 11/26/2014 Document Reviewed: 03/31/2015 Elsevier Interactive Patient Education [employer].

## 2015-11-08 ENCOUNTER — Encounter: Payer: Self-pay | Admitting: Family Medicine

## 2015-11-24 ENCOUNTER — Ambulatory Visit (INDEPENDENT_AMBULATORY_CARE_PROVIDER_SITE_OTHER): Payer: BLUE CROSS/BLUE SHIELD | Admitting: Internal Medicine

## 2015-11-24 ENCOUNTER — Encounter: Payer: Self-pay | Admitting: Internal Medicine

## 2015-11-24 VITALS — BP 120/84 | HR 70 | Temp 97.9°F | Ht 69.0 in | Wt 224.0 lb

## 2015-11-24 DIAGNOSIS — J309 Allergic rhinitis, unspecified: Secondary | ICD-10-CM | POA: Diagnosis not present

## 2015-11-24 DIAGNOSIS — K219 Gastro-esophageal reflux disease without esophagitis: Secondary | ICD-10-CM | POA: Diagnosis not present

## 2015-11-24 DIAGNOSIS — J019 Acute sinusitis, unspecified: Secondary | ICD-10-CM | POA: Insufficient documentation

## 2015-11-24 MED ORDER — AZITHROMYCIN 250 MG PO TABS: ORAL_TABLET | ORAL | Status: DC

## 2015-11-24 MED ORDER — HYDROCOD POLST-CPM POLST ER 10-8 MG/5ML PO SUER: 5.0000 mL | Freq: Every evening | ORAL | Status: DC | PRN

## 2015-11-24 NOTE — Patient Instructions (Addendum)
Please take all new medication as prescribed - the antibiotic, and cough medicine  You can also take Delsym OTC for cough, and/or Mucinex (or it's generic off brand) for congestion, and tylenol as needed for pain.  Please continue all other medications as before, and refills have been done if requested.  Please have the pharmacy call with any other refills you may need.  Please keep your appointments with your specialists as you may have planned     

## 2015-11-24 NOTE — Progress Notes (Signed)
Subjective:    Patient ID: Joe Hawkins, male    DOB: 01-15-1961, 55 y.o.   MRN: [id number]  HPI   Here with 2-3 days acute onset fever, facial pain, pressure, headache, general weakness and malaise, and greenish d/c, with mild ST and cough, but pt denies chest pain, wheezing, increased sob or doe, orthopnea, PND, increased LE swelling, palpitations, dizziness or syncope.  Pt denies new neurological symptoms such as new headache, or facial or extremity weakness or numbness   Pt denies polydipsia, polyuria.  Does have several wks ongoing nasal allergy symptoms with clearish congestion, itch and sneezing, without fever, pain, ST, cough, swelling or wheezing.  Denies worsening reflux, abd pain, dysphagia, n/v, bowel change or blood. Past Medical History  Diagnosis Date  . GERD (gastroesophageal reflux disease)   . Sinusitis     Dr Constance Holster  . Allergic rhinitis   . Low back pain   . Tinnitus 2011   Past Surgical History  Procedure Laterality Date  . Nasal sinus surgery  2008  . Back surgery  2007    reports that he has quit smoking. He does not have any smokeless tobacco history on file. He reports that he does not drink alcohol or use illicit drugs. family history is negative for Cancer. Allergies  Allergen Reactions  . Shellfish Allergy     Crab, lobsters and shrimp.    Current Outpatient Prescriptions on File Prior to Visit  Medication Sig Dispense Refill  . azithromycin (ZITHROMAX) 250 MG tablet Take 2 tabs PO x 1 dose, then 1 tab PO QD x 4 days 6 tablet 0  . chlorpheniramine-HYDROcodone (TUSSIONEX PENNKINETIC ER) 10-8 MG/5ML SUER Take 5 mLs by mouth at bedtime as needed for cough. 90 mL 0  . diazepam (VALIUM) 5 MG tablet Take 1 tablet (5 mg total) by mouth 2 (two) times daily. 10 tablet 0  . diclofenac (VOLTAREN) 75 MG EC tablet Take 1 tablet (75 mg total) by mouth 2 (two) times daily. 20 tablet 0  . EPINEPHrine (EPIPEN) 0.3 mg/0.3 mL DEVI Inject 0.3 mLs (0.3 mg total) into the muscle  once. 1 Device 0  . fluticasone (FLONASE) 50 MCG/ACT nasal spray Place 2 sprays into both nostrils at bedtime. 16 g 12  . nabumetone (RELAFEN) 500 MG tablet Take 500 mg by mouth daily.    Marland Kitchen amoxicillin-clavulanate (AUGMENTIN) 875-125 MG tablet Take 1 tablet by mouth 2 (two) times daily. (Patient not taking: Reported on 11/24/2015) 20 tablet 0  . predniSONE (DELTASONE) 20 MG tablet 3 tabs po qd x2d, 2 TABS PO QD X 2D, 1 TAB PO QD X 2D (Patient not taking: Reported on 11/24/2015) 12 tablet 0  . triamcinolone cream (KENALOG) 0.5 % Apply topically 3 (three) times daily. 30 g 1   No current facility-administered medications on file prior to visit.    Review of Systems  Constitutional: Negative for unusual diaphoresis or night sweats HENT: Negative for ringing in ear or discharge Eyes: Negative for double vision or worsening visual disturbance.  Respiratory: Negative for choking and stridor.   Gastrointestinal: Negative for vomiting or other signifcant bowel change Genitourinary: Negative for hematuria or change in urine volume.  Musculoskeletal: Negative for other MSK pain or swelling Skin: Negative for color change and worsening wound.  Neurological: Negative for tremors and numbness other than noted  Psychiatric/Behavioral: Negative for decreased concentration or agitation other than above       Objective:   Physical Exam BP 120/84 mmHg  Pulse  70  Temp(Src) 97.9 F (36.6 C) (Oral)  Ht 5\' 9"  (1.753 m)  Wt 224 lb (101.606 kg)  BMI 33.06 kg/m2  SpO2 95% VS noted, mild ill appearing Constitutional: Pt appears in no significant distress HENT: Head: NCAT.  Right Ear: External ear normal.  Left Ear: External ear normal.  Bilat tm's with mild erythema.  Max sinus areas mod tender.  Pharynx with mild erythema, no exudate Eyes: . Pupils are equal, round, and reactive to light. Conjunctivae and EOM are normal Neck: Normal range of motion. Neck supple.  Cardiovascular: Normal rate and regular  rhythm.   Pulmonary/Chest: Effort normal and breath sounds without rales or wheezing.  Neurological: Pt is alert. Not confused , motor grossly intact Skin: Skin is warm. No rash, no LE edema Psychiatric: Pt behavior is normal. No agitation.     Assessment & Plan:

## 2015-11-24 NOTE — Assessment & Plan Note (Signed)
Mild to mod, for flonase restart,  to f/u any worsening symptoms or concerns 

## 2015-11-24 NOTE — Assessment & Plan Note (Signed)
stable overall by history and exam, does not appear to be related to cough,, and pt to continue medical treatment as before,  to f/u any worsening symptoms or concerns

## 2015-11-24 NOTE — Progress Notes (Signed)
Pre visit review using our clinic review tool, if applicable. No additional management support is needed unless otherwise documented below in the visit note. 

## 2016-12-11 ENCOUNTER — Ambulatory Visit (INDEPENDENT_AMBULATORY_CARE_PROVIDER_SITE_OTHER): Payer: BLUE CROSS/BLUE SHIELD | Admitting: Physician Assistant

## 2016-12-11 ENCOUNTER — Ambulatory Visit (INDEPENDENT_AMBULATORY_CARE_PROVIDER_SITE_OTHER): Payer: BLUE CROSS/BLUE SHIELD

## 2016-12-11 VITALS — BP 120/80 | HR 78 | Temp 98.1°F | Resp 16 | Ht 69.0 in | Wt 222.0 lb

## 2016-12-11 DIAGNOSIS — R0989 Other specified symptoms and signs involving the circulatory and respiratory systems: Secondary | ICD-10-CM

## 2016-12-11 LAB — POCT CBC
GRANULOCYTE PERCENT: 55.6 % (ref 37–80)
HCT, POC: 45.8 % (ref 43.5–53.7)
HEMOGLOBIN: 15.9 g/dL (ref 14.1–18.1)
Lymph, poc: 2.9 (ref 0.6–3.4)
MCH: 26.2 pg — AB (ref 27–31.2)
MCHC: 34.7 g/dL (ref 31.8–35.4)
MCV: 75.5 fL — AB (ref 80–97)
MID (cbc): 0.9 (ref 0–0.9)
MPV: 7.4 fL (ref 0–99.8)
PLATELET COUNT, POC: 234 10*3/uL (ref 142–424)
POC Granulocyte: 4.7 (ref 2–6.9)
POC LYMPH %: 33.7 % (ref 10–50)
POC MID %: 10.7 %M (ref 0–12)
RBC: 6.07 M/uL (ref 4.69–6.13)
RDW, POC: 13.2 %
WBC: 8.5 10*3/uL (ref 4.6–10.2)

## 2016-12-11 MED ORDER — AZITHROMYCIN 250 MG PO TABS: ORAL_TABLET | ORAL | 0 refills | Status: DC

## 2016-12-11 MED ORDER — HYDROCODONE-HOMATROPINE 5-1.5 MG/5ML PO SYRP: 2.5000 mL | ORAL_SOLUTION | Freq: Every day | ORAL | 0 refills | Status: AC

## 2016-12-11 NOTE — Patient Instructions (Signed)
     IF you received an x-ray today, you will receive an invoice from [facility]. Please contact [facility] at [phone] with questions or concerns regarding your invoice.   IF you received labwork today, you will receive an invoice from [facility]. Please contact [facility] at [phone] with questions or concerns regarding your invoice.   Our billing staff will not be able to assist you with questions regarding bills from these companies.  You will be contacted with the lab results as soon as they are available. The fastest way to get your results is to activate your My Chart account. Instructions are located on the last page of this paperwork. If you have not heard from us regarding the results in 2 weeks, please contact this office.     

## 2016-12-11 NOTE — Progress Notes (Signed)
12/11/2016 5:02 PM   DOB: [birth year] / MRN: [id number]  SUBJECTIVE:  Joe Hawkins is a 56 y.o. male presenting for sore throat, runny nose, and ear congestion.  This started three days ago. He is getting worse.  Denies fever. Has been taking otc robitussin with good relief.  Reports chills and cough.  Denies a history of asthma and smoking.    He is allergic to shellfish allergy.   He  has a past medical history of Allergic rhinitis; GERD (gastroesophageal reflux disease); Low back pain; Sinusitis; and Tinnitus (2011).    He  reports that he has quit smoking. He has never used smokeless tobacco. He reports that he does not drink alcohol or use drugs. He  reports that he currently engages in sexual activity. The patient  has a past surgical history that includes Nasal sinus surgery (2008) and Back surgery (2007).  His family history is not on file.  Review of Systems  Constitutional: Negative for chills and fever.  Respiratory: Positive for cough and sputum production. Negative for hemoptysis, shortness of breath and wheezing.   Cardiovascular: Negative for chest pain.  Gastrointestinal: Negative for nausea.  Neurological: Negative for dizziness.  Psychiatric/Behavioral: Negative for depression and substance abuse. The patient is nervous/anxious.     The problem list and medications were reviewed and updated by myself where necessary and exist elsewhere in the encounter.   OBJECTIVE:  BP 120/80   Pulse 78   Temp 98.1 F (36.7 C) (Oral)   Resp 16   Ht 5\' 9"  (1.753 m)   Wt 222 lb (100.7 kg)   SpO2 96%   BMI 32.78 kg/m   Physical Exam  Constitutional: He is oriented to person, place, and time. He appears well-nourished. No distress.  Cardiovascular: Normal rate and regular rhythm.   Pulmonary/Chest: Effort normal. He has rales (left lower lung field).  Musculoskeletal: Normal range of motion.  Neurological: He is alert and oriented to person, place, and time.  Skin: Skin is warm  and dry.  Vitals reviewed.   Results for orders placed or performed in visit on 12/11/16 (from the past 72 hour(s))  POCT CBC     Status: Abnormal   Collection Time: 12/11/16  4:34 PM  Result Value Ref Range   WBC 8.5 4.6 - 10.2 K/uL   Lymph, poc 2.9 0.6 - 3.4   POC LYMPH PERCENT 33.7 10 - 50 %L   MID (cbc) 0.9 0 - 0.9   POC MID % 10.7 0 - 12 %M   POC Granulocyte 4.7 2 - 6.9   Granulocyte percent 55.6 37 - 80 %G   RBC 6.07 4.69 - 6.13 M/uL   Hemoglobin 15.9 14.1 - 18.1 g/dL   HCT, POC 45.8 43.5 - 53.7 %   MCV 75.5 (A) 80 - 97 fL   MCH, POC 26.2 (A) 27 - 31.2 pg   MCHC 34.7 31.8 - 35.4 g/dL   RDW, POC 13.2 %   Platelet Count, POC 234 142 - 424 K/uL   MPV 7.4 0 - 99.8 fL    Dg Chest 2 View  Result Date: 12/11/2016 CLINICAL DATA:  Abnormal exam of the left upper lung. EXAM: CHEST  2 VIEW COMPARISON:  History of gastroesophageal reflux.  Former smoker. FINDINGS: The lungs are adequately inflated. There is no focal infiltrate or pleural effusion. The heart and pulmonary vascularity are normal. The mediastinum is normal in width. There is no pleural effusion. The trachea is midline. The bony thorax  exhibits no acute abnormality. IMPRESSION: There is no evidence of pneumonia nor other acute cardiopulmonary abnormality Electronically Signed   By: Joe  Hawkins M.D.   On: 12/11/2016 16:42    ASSESSMENT AND PLAN:  Joe Hawkins was seen today for nasal congestion, headache and sore throat.  Diagnoses and all orders for this visit:  Lung crackles: Rads clear and CBC reassuring.  Given his rales I am going to treat with azithromycin as this could still be a very early pneumonia. Advised that he return if he is not feeling better in 2-3 days, sooner if worse.  -     POCT CBC -     DG Chest 2 View; Future    The patient is advised to call or return to clinic if he does not see an improvement in symptoms, or to seek the care of the closest emergency department if he worsens with the above plan.    Philis Fendt, MHS, PA-C [facility] 12/11/2016 5:02 PM

## 2016-12-12 LAB — FERRITIN: Ferritin: 648 ng/mL — ABNORMAL HIGH (ref 30–400)

## 2016-12-25 ENCOUNTER — Ambulatory Visit (INDEPENDENT_AMBULATORY_CARE_PROVIDER_SITE_OTHER): Payer: BLUE CROSS/BLUE SHIELD

## 2016-12-25 ENCOUNTER — Ambulatory Visit (INDEPENDENT_AMBULATORY_CARE_PROVIDER_SITE_OTHER): Payer: BLUE CROSS/BLUE SHIELD | Admitting: Physician Assistant

## 2016-12-25 VITALS — BP 110/72 | HR 80 | Temp 98.3°F | Resp 17 | Ht 69.0 in | Wt 224.0 lb

## 2016-12-25 DIAGNOSIS — R7989 Other specified abnormal findings of blood chemistry: Secondary | ICD-10-CM

## 2016-12-25 DIAGNOSIS — J3489 Other specified disorders of nose and nasal sinuses: Secondary | ICD-10-CM

## 2016-12-25 DIAGNOSIS — J302 Other seasonal allergic rhinitis: Secondary | ICD-10-CM | POA: Diagnosis not present

## 2016-12-25 LAB — POCT SEDIMENTATION RATE: POCT SED RATE: 30 mm/hr — AB (ref 0–22)

## 2016-12-25 MED ORDER — OLOPATADINE HCL 0.2 % OP SOLN: OPHTHALMIC | 0 refills | Status: DC

## 2016-12-25 MED ORDER — LEVOCETIRIZINE DIHYDROCHLORIDE 5 MG PO TABS: 5.0000 mg | ORAL_TABLET | Freq: Every evening | ORAL | 0 refills | Status: DC

## 2016-12-25 NOTE — Progress Notes (Signed)
  01/01/2017 8:14 AM   DOB: 04/23/1961 / MRN: [id number]  SUBJECTIVE:  Joe Hawkins is a 56 y.o. male presenting for frontal sinus pain. Assoicates eye itching and tearing, cough, sore throat, rhinorrhea and sneezing.  Feels this is getting worse.  Tells me I want to scratch my eyes out.    He is allergic to shellfish allergy.   He  has a past medical history of Allergic rhinitis; GERD (gastroesophageal reflux disease); Low back pain; Sinusitis; and Tinnitus (2011).    He  reports that he has quit smoking. He has never used smokeless tobacco. He reports that he does not drink alcohol or use drugs. He  reports that he currently engages in sexual activity. The patient  has a past surgical history that includes Nasal sinus surgery (2008) and Back surgery (2007).  His family history is not on file.  Review of Systems  Constitutional: Negative for chills and fever.  Respiratory: Negative for cough and wheezing.   Cardiovascular: Negative for chest pain.  Skin: Negative for rash.  Neurological: Negative for dizziness.    The problem list and medications were reviewed and updated by myself where necessary and exist elsewhere in the encounter.   OBJECTIVE:  BP 110/72 (BP Location: Right Arm, Patient Position: Sitting, Cuff Size: Normal)   Pulse 80   Temp 98.3 F (36.8 C) (Oral)   Resp 17   Ht 5\' 9"  (1.753 m)   Wt 224 lb (101.6 kg)   SpO2 93%   BMI 33.08 kg/m   Physical Exam  Constitutional: He is oriented to person, place, and time.  HENT:  Right Ear: Tympanic membrane normal.  Left Ear: Tympanic membrane normal.  Nose: Mucosal edema present.  Mouth/Throat: Uvula is midline, oropharynx is clear and moist and mucous membranes are normal.  Cardiovascular: Normal rate and regular rhythm.   Pulmonary/Chest: Effort normal and breath sounds normal.  Musculoskeletal: Normal range of motion.  Neurological: He is alert and oriented to person, place, and time.     No results found for  this or any previous visit (from the past 72 hour(s)).  No results found.  ASSESSMENT AND PLAN: Best phone number [phone]   Glenis was seen today for ear fullness, hard to swallow, cough and headache.  Diagnoses and all orders for this visit:  Elevated ferritin level: His iron is normal which is reassuring in the setting of elevated LFTs.  He is of Asian decent and this may be a thalassemia.   -     Ferritin -     Iron -     Iron and TIBC -     Hepatic Function Panel  Frontal sinus pain -     DG SinUS 1-2 Views; Future -     POCT SEDIMENTATION RATE  Chronic seasonal allergic rhinitis, unspecified trigger -     levocetirizine (XYZAL) 5 MG tablet; Take 1 tablet (5 mg total) by mouth every evening. -     Olopatadine HCl 0.2 % SOLN; 1 drop in each eye every 8 hours as needed for itching.    The patient is advised to call or return to clinic if he does not see an improvement in symptoms, or to seek the care of the closest emergency department if he worsens with the above plan.   Philis Fendt, MHS, PA-C [facility] 01/01/2017 8:14 AM

## 2016-12-26 LAB — HEPATIC FUNCTION PANEL
ALBUMIN: 4.3 g/dL (ref 3.5–5.5)
ALK PHOS: 70 IU/L (ref 39–117)
ALT: 57 IU/L — ABNORMAL HIGH (ref 0–44)
AST: 62 IU/L — AB (ref 0–40)
BILIRUBIN TOTAL: 0.5 mg/dL (ref 0.0–1.2)
Bilirubin, Direct: 0.13 mg/dL (ref 0.00–0.40)
Total Protein: 7.4 g/dL (ref 6.0–8.5)

## 2016-12-26 LAB — IRON AND TIBC
Iron Saturation: 38 % (ref 15–55)
Iron: 83 ug/dL (ref 38–169)
Total Iron Binding Capacity: 220 ug/dL — ABNORMAL LOW (ref 250–450)
UIBC: 137 ug/dL (ref 111–343)

## 2016-12-26 LAB — FERRITIN: Ferritin: 681 ng/mL — ABNORMAL HIGH (ref 30–400)

## 2017-01-01 NOTE — Addendum Note (Signed)
Addended by: Gari Crown D on: 01/01/2017 10:27 AM   Modules accepted: Orders

## 2017-01-02 ENCOUNTER — Other Ambulatory Visit: Payer: Self-pay | Admitting: Physician Assistant

## 2017-01-02 DIAGNOSIS — R7989 Other specified abnormal findings of blood chemistry: Secondary | ICD-10-CM

## 2017-01-02 DIAGNOSIS — R945 Abnormal results of liver function studies: Principal | ICD-10-CM

## 2017-01-02 LAB — HEPATITIS PANEL, ACUTE
HEP B C IGM: NEGATIVE
Hep A IgM: NEGATIVE
Hepatitis B Surface Ag: NEGATIVE

## 2017-01-02 LAB — HEMOGLOBIN A1C
ESTIMATED AVERAGE GLUCOSE: 111 mg/dL
Hgb A1c MFr Bld: 5.5 % (ref 4.8–5.6)

## 2017-01-02 LAB — SPECIMEN STATUS REPORT

## 2017-01-03 ENCOUNTER — Telehealth: Payer: Self-pay | Admitting: Emergency Medicine

## 2017-01-03 NOTE — Telephone Encounter (Signed)
-----   Message from Cherrie Distance sent at 01/02/2017  8:11 AM EST -----   ----- Message ----- From: Tereasa Coop, PA-C Sent: 01/02/2017   7:49 AM To: [facility] Scheduling Pool  Please give him a call.  I need to order a RUQ ultrasound to take a look at his liver. I'm placing the order.   His ferritin level appears to be a non issue given he is not diabetic and does not have hepatitis..There is no rush on the image.  Philis Fendt, MS, PA-C 7:48 AM, 01/02/2017

## 2017-01-07 ENCOUNTER — Ambulatory Visit (HOSPITAL_COMMUNITY)
Admission: RE | Admit: 2017-01-07 | Discharge: 2017-01-07 | Disposition: A | Discharge status: Home / Self Care | Payer: BLUE CROSS/BLUE SHIELD | Source: Ambulatory Visit | Attending: Physician Assistant | Admitting: Physician Assistant

## 2017-01-07 DIAGNOSIS — R7989 Other specified abnormal findings of blood chemistry: Secondary | ICD-10-CM

## 2017-01-07 DIAGNOSIS — R932 Abnormal findings on diagnostic imaging of liver and biliary tract: Secondary | ICD-10-CM | POA: Insufficient documentation

## 2017-01-07 DIAGNOSIS — R945 Abnormal results of liver function studies: Secondary | ICD-10-CM

## 2017-01-09 NOTE — Progress Notes (Signed)
Work up is looking good.  I want to see him back in my clinic in about 3 months.  Please call and schedule him. Philis Fendt, MS, PA-C 3:22 PM, 01/09/2017

## 2017-04-09 ENCOUNTER — Encounter: Payer: Self-pay | Admitting: Nurse Practitioner

## 2017-04-09 ENCOUNTER — Ambulatory Visit (INDEPENDENT_AMBULATORY_CARE_PROVIDER_SITE_OTHER): Payer: BLUE CROSS/BLUE SHIELD | Admitting: Nurse Practitioner

## 2017-04-09 VITALS — BP 110/70 | HR 60 | Temp 98.2°F | Ht 69.0 in | Wt 224.0 lb

## 2017-04-09 DIAGNOSIS — J029 Acute pharyngitis, unspecified: Secondary | ICD-10-CM | POA: Diagnosis not present

## 2017-04-09 DIAGNOSIS — K1120 Sialoadenitis, unspecified: Secondary | ICD-10-CM | POA: Diagnosis not present

## 2017-04-09 LAB — POCT RAPID STREP A (OFFICE): RAPID STREP A SCREEN: NEGATIVE

## 2017-04-09 MED ORDER — NABUMETONE 500 MG PO TABS: 500.0000 mg | ORAL_TABLET | Freq: Every day | ORAL | 0 refills | Status: DC | PRN

## 2017-04-09 MED ORDER — IBUPROFEN 600 MG PO TABS: 600.0000 mg | ORAL_TABLET | Freq: Three times a day (TID) | ORAL | 0 refills | Status: DC | PRN

## 2017-04-09 MED ORDER — AMOXICILLIN-POT CLAVULANATE 875-125 MG PO TABS: 1.0000 | ORAL_TABLET | Freq: Two times a day (BID) | ORAL | 0 refills | Status: DC

## 2017-04-09 NOTE — Patient Instructions (Signed)
Parotitis  Parotitis is irritation and swelling (inflammation) of one or both of your parotid glands. These glands produce saliva. They are found on each side of your face, below and in front of your earlobes. The saliva that they produce comes out of tiny openings (ducts) inside your cheeks.  Parotitis may cause sudden swelling and pain (acute parotitis). It can also cause repeated episodes of swelling and pain or continued swelling that may or may not be painful (chronic parotitis).  What are the causes?  Causes of this condition include:  · Bacterial infections.  · Viral infections, such as mumps and HIV.  · Blockage (obstruction) of saliva flow through the parotid glands. This can be from a stone, scar tissue, or tumor.  · Diseases that cause your body's defense system to attack salivary glands and cause inflammation (autoimmune diseases).    What increases the risk?  This condition is more likely to develop in:  · People who are 50 or older.  · People who do not drink enough fluids (dehydration).  · People who drink too much alcohol.  · People who have a dry mouth.  · People who have poor dental hygiene.  · People who have diabetes.  · People who have gout.  · People who have a long-term illness.  · People who have had X-ray treatments to the head and neck.  · People who take certain medicines.    What are the signs or symptoms?  Symptoms of this condition depend on the cause. Symptoms may include:  · Swelling under and in front of the ear. This may get worse after eating.  · Redness of the skin over the parotid gland.  · Pain and tenderness over the parotid gland. This may get worse after eating.  · Fever or chills.  · Pus coming from the ducts inside the mouth.  · Dry mouth.  · A bad taste in the mouth.    How is this diagnosed?  This condition is diagnosed with a medical history and physical exam. You may also have tests to find the cause of parotitis. These tests may include:  · Doing blood tests to check  for autoimmune disease or a viral infection.  · Taking a sample of fluid from the parotid gland to test for infection.  · Injecting the ducts of the gland with a dye before taking X-rays (sialogram).  · Having other imaging studies of the gland, including X-rays, ultrasound, MRI, or CT scan.  · Checking the opening of the gland for a stone or obstruction.  · Placing a needle into the gland to remove tissue for a biopsy (fine needle aspiration).    How is this treated?  Treatment for this condition depends on the cause. Treatment may include:  · Antibiotic medicine for a bacterial infection.  · Drinking more fluids.  · Removing a stone or obstruction.  · Treating an underlying disease that is causing parotitis.  · Surgery to drain an infection, remove a growth, or remove the whole gland (parotidectomy).    Treatment may not be needed if parotid swelling goes away with home care.  Follow these instructions at home:  Medicines   · Take over-the-counter and prescription medicines only as told by your health care provider.  · If you were prescribed an antibiotic, take it as told by your health care provider. Do not stop taking the antibiotic even if you start to feel better.  Managing pain and swelling   ·   Apply warm compresses to the affected area as told by your health care provider.  · Gently massage the parotid glands as told by your health care provider.  General instructions     · Drink enough fluid to keep your urine clear or pale yellow.  · Try sucking on sour candy. This may help to make your mouth less dry and may stimulate the flow of saliva.  · Keep your mouth clean and moist. Gargle with a salt-water mixture 3-4 times per day, or as needed. To make a salt-water mixture, completely dissolve ½-1 tsp of salt in 1 cup of warm water.  · Maintain good oral health.  ? Brush your teeth at least two times per day.  ? Floss your teeth every day.  ? See your dentist regularly.  · Do not use tobacco products, including  cigarettes, chewing tobacco, or e-cigarettes. If you need help quitting, ask your health care provider.  · Keep all follow-up visits as told by your health care provider. This is important.  Contact a health care provider if:  · You have a fever or chills.  · You have new symptoms.  · Your symptoms get worse.  · Your symptoms do not improve with treatment.  This information is not intended to replace advice given to you by your health care provider. Make sure you discuss any questions you have with your health care provider.  Document Released: 04/27/2002 Document Revised: 04/12/2016 Document Reviewed: 03/31/2015  Elsevier Interactive Patient Education © 2017 [employer]

## 2017-04-09 NOTE — Progress Notes (Signed)
Subjective:  Patient ID: Manav Pierotti, male    DOB: [birth year]  Age: 56 y.o. MRN: [id number]  CC: Sore Throat (sore throat, yellow mucus,painful on the side of neck, sinus pressure. 3 days. )  Sore Throat   This is a new problem. The problem has been unchanged. The pain is worse on the right side. There has been no fever. Associated symptoms include ear pain, headaches, a hoarse voice, a plugged ear sensation, swollen glands and trouble swallowing. Pertinent negatives include no abdominal pain, congestion, coughing, diarrhea, drooling, ear discharge, neck pain, shortness of breath, stridor or vomiting. He has had no exposure to strep or mono. He has tried NSAIDs, acetaminophen and gargles for the symptoms. The treatment provided mild relief.  he had similar symptoms 09/2015: diagnosed and treated for parotitis.  Denies any recent travel out of country.  Outpatient Medications Prior to Visit  Medication Sig Dispense Refill  . EPINEPHrine (EPIPEN) 0.3 mg/0.3 mL DEVI Inject 0.3 mLs (0.3 mg total) into the muscle once. 1 Device 0  . Olopatadine HCl 0.2 % SOLN 1 drop in each eye every 8 hours as needed for itching. 2.5 mL 0  . levocetirizine (XYZAL) 5 MG tablet Take 1 tablet (5 mg total) by mouth every evening. (Patient not taking: Reported on 04/09/2017) 30 tablet 0   No facility-administered medications prior to visit.     ROS See HPI  Objective:  BP 110/70   Pulse 60   Temp 98.2 F (36.8 C)   Ht 5\' 9"  (1.753 m)   Wt 224 lb (101.6 kg)   SpO2 98%   BMI 33.08 kg/m   BP Readings from Last 3 Encounters:  04/09/17 110/70  12/25/16 110/72  12/11/16 120/80    Wt Readings from Last 3 Encounters:  04/09/17 224 lb (101.6 kg)  12/25/16 224 lb (101.6 kg)  12/11/16 222 lb (100.7 kg)    Physical Exam  Constitutional: He is oriented to person, place, and time. No distress.  HENT:  Head: Normocephalic.    Right Ear: External ear and ear canal normal. No mastoid tenderness. Tympanic  membrane is erythematous. A middle ear effusion is present.  Left Ear: No mastoid tenderness. Tympanic membrane is not erythematous. A middle ear effusion is present.  Nose: Nose normal.  Mouth/Throat: Uvula is midline. No oral lesions. No trismus in the jaw. Posterior oropharyngeal erythema present. No oropharyngeal exudate or posterior oropharyngeal edema.  Eyes: No scleral icterus.  Neck: Normal range of motion. Neck supple. No thyromegaly present.  Cardiovascular: Normal rate.   Pulmonary/Chest: Effort normal and breath sounds normal.  Lymphadenopathy:    He has cervical adenopathy.  Neurological: He is alert and oriented to person, place, and time.  Vitals reviewed.   Lab Results  Component Value Date   WBC 8.5 12/11/2016   HGB 15.9 12/11/2016   HCT 45.8 12/11/2016   PLT 263.0 11/29/2010   GLUCOSE 90 09/14/2015   ALT 57 (H) 12/25/2016   AST 62 (H) 12/25/2016   NA 140 09/14/2015   K 4.1 09/14/2015   CL 103 09/14/2015   CREATININE 0.82 09/14/2015   BUN 15 09/14/2015   CO2 25 09/14/2015   TSH 1.53 03/25/2007   HGBA1C 5.5 01/01/2017    US Abdomen Limited Ruq  Result Date: 01/07/2017 CLINICAL DATA:  Elevated LFTs, increased ferritin level EXAM: US ABDOMEN LIMITED - RIGHT UPPER QUADRANT COMPARISON:  None. FINDINGS: Gallbladder: No gallstones or wall thickening visualized. No sonographic Murphy sign noted by sonographer. Common  bile duct: Diameter: Measures 4 mm in diameter. Liver: No focal lesion identified. Mild increased echogenicity of the liver suspicious for fatty infiltration or chronic liver disease. IMPRESSION: 1. No gallstones are noted within gallbladder. Normal CBD. No sonographic Murphy's sign. 2. No focal hepatic mass. Mild increased liver echogenicity suspicious for fatty infiltration or chronic liver disease. Electronically Signed   By: Lahoma Crocker M.D.   On: 01/07/2017 14:32    Assessment & Plan:   Lorraine was seen today for sore throat.  Diagnoses and all orders  for this visit:  Acute pharyngitis, unspecified etiology -     POCT rapid strep A -     amoxicillin-clavulanate (AUGMENTIN) 875-125 MG tablet; Take 1 tablet by mouth 2 (two) times daily. -     Discontinue: ibuprofen (ADVIL,MOTRIN) 600 MG tablet; Take 1 tablet (600 mg total) by mouth every 8 (eight) hours as needed. -     Ambulatory referral to ENT -     nabumetone (RELAFEN) 500 MG tablet; Take 1 tablet (500 mg total) by mouth daily as needed.  Parotitis -     amoxicillin-clavulanate (AUGMENTIN) 875-125 MG tablet; Take 1 tablet by mouth 2 (two) times daily. -     Discontinue: ibuprofen (ADVIL,MOTRIN) 600 MG tablet; Take 1 tablet (600 mg total) by mouth every 8 (eight) hours as needed. -     Ambulatory referral to ENT -     nabumetone (RELAFEN) 500 MG tablet; Take 1 tablet (500 mg total) by mouth daily as needed.   I have discontinued Mr. Kassab's ibuprofen. I have also changed his nabumetone. Additionally, I am having him start on amoxicillin-clavulanate. Lastly, I am having him maintain his EPINEPHrine, levocetirizine, and Olopatadine HCl.  Meds ordered this encounter  Medications  . DISCONTD: nabumetone (RELAFEN) 500 MG tablet    Sig: Take 500 mg by mouth daily.  Marland Kitchen amoxicillin-clavulanate (AUGMENTIN) 875-125 MG tablet    Sig: Take 1 tablet by mouth 2 (two) times daily.    Dispense:  14 tablet    Refill:  0    Order Specific Question:   Supervising Provider    Answer:   Cassandria Anger [[id number]]  . DISCONTD: ibuprofen (ADVIL,MOTRIN) 600 MG tablet    Sig: Take 1 tablet (600 mg total) by mouth every 8 (eight) hours as needed.    Dispense:  30 tablet    Refill:  0    Order Specific Question:   Supervising Provider    Answer:   Cassandria Anger [[id number]]  . nabumetone (RELAFEN) 500 MG tablet    Sig: Take 1 tablet (500 mg total) by mouth daily as needed.    Dispense:  30 tablet    Refill:  0    Order Specific Question:   Supervising Provider    Answer:   Cassandria Anger  [[id number]]     Follow-up: Return if symptoms worsen or fail to improve.  Wilfred Lacy, NP

## 2017-10-03 ENCOUNTER — Other Ambulatory Visit (INDEPENDENT_AMBULATORY_CARE_PROVIDER_SITE_OTHER): Payer: BLUE CROSS/BLUE SHIELD

## 2017-10-03 ENCOUNTER — Encounter: Payer: Self-pay | Admitting: Family Medicine

## 2017-10-03 ENCOUNTER — Ambulatory Visit: Payer: BLUE CROSS/BLUE SHIELD | Admitting: Family Medicine

## 2017-10-03 VITALS — BP 118/70 | HR 62 | Temp 98.5°F | Ht 69.0 in | Wt 230.0 lb

## 2017-10-03 DIAGNOSIS — R590 Localized enlarged lymph nodes: Secondary | ICD-10-CM | POA: Diagnosis not present

## 2017-10-03 DIAGNOSIS — D721 Eosinophilia, unspecified: Secondary | ICD-10-CM | POA: Insufficient documentation

## 2017-10-03 DIAGNOSIS — Z23 Encounter for immunization: Secondary | ICD-10-CM

## 2017-10-03 DIAGNOSIS — J029 Acute pharyngitis, unspecified: Secondary | ICD-10-CM | POA: Diagnosis not present

## 2017-10-03 LAB — CBC WITH DIFFERENTIAL/PLATELET
BASOS ABS: 0.1 10*3/uL (ref 0.0–0.1)
Basophils Relative: 0.7 % (ref 0.0–3.0)
EOS ABS: 1.2 10*3/uL — AB (ref 0.0–0.7)
Eosinophils Relative: 13.7 % — ABNORMAL HIGH (ref 0.0–5.0)
HEMATOCRIT: 46.3 % (ref 39.0–52.0)
Hemoglobin: 14.9 g/dL (ref 13.0–17.0)
LYMPHS PCT: 36.2 % (ref 12.0–46.0)
Lymphs Abs: 3.1 10*3/uL (ref 0.7–4.0)
MCHC: 32.2 g/dL (ref 30.0–36.0)
MCV: 78.4 fl (ref 78.0–100.0)
MONOS PCT: 10.5 % (ref 3.0–12.0)
Monocytes Absolute: 0.9 10*3/uL (ref 0.1–1.0)
NEUTROS ABS: 3.4 10*3/uL (ref 1.4–7.7)
NEUTROS PCT: 38.9 % — AB (ref 43.0–77.0)
PLATELETS: 234 10*3/uL (ref 150.0–400.0)
RBC: 5.9 Mil/uL — AB (ref 4.22–5.81)
RDW: 13.1 % (ref 11.5–15.5)
WBC: 8.7 10*3/uL (ref 4.0–10.5)

## 2017-10-03 LAB — POCT RAPID STREP A (OFFICE): RAPID STREP A SCREEN: NEGATIVE

## 2017-10-03 MED ORDER — AMOXICILLIN 500 MG PO CAPS: 500.0000 mg | ORAL_CAPSULE | Freq: Three times a day (TID) | ORAL | 0 refills | Status: DC

## 2017-10-03 NOTE — Progress Notes (Signed)
   Subjective:    Patient ID: Joe Hawkins, male    DOB: 1961/03/20, 56 y.o.   MRN: [id number]  HPI 2 day ho sorethroat, headache, pnd, cough and a swollen mass on the left side of his neck. Ho allergy rhinitis. [place] national and stateside for some time now.    Review of Systems  Constitutional: Negative for appetite change, chills, diaphoresis, fatigue and fever.  HENT: Positive for facial swelling, postnasal drip, sneezing and sore throat. Negative for rhinorrhea, sinus pressure, sinus pain, trouble swallowing and voice change.   Eyes: Negative.   Respiratory: Positive for cough. Negative for shortness of breath, wheezing and stridor.   Cardiovascular: Negative.   Gastrointestinal: Negative for abdominal pain, nausea and vomiting.  Genitourinary: Negative.   Musculoskeletal: Negative for arthralgias.  Skin: Negative for color change and rash.  Neurological: Positive for headaches.  Hematological: Negative.   Psychiatric/Behavioral: Negative.       pmh, soc and family hx reviewed.  Objective:   Physical Exam  Constitutional: He is oriented to person, place, and time. He appears well-developed and well-nourished. No distress.  HENT:  Head: Normocephalic and atraumatic.  Right Ear: External ear normal.  Left Ear: External ear normal.  Mouth/Throat: Oropharynx is clear and moist. No oropharyngeal exudate.  Eyes: Conjunctivae are normal. Pupils are equal, round, and reactive to light. Right eye exhibits no discharge. Left eye exhibits no discharge. No scleral icterus.  Neck: Neck supple. No JVD present. No tracheal deviation present. No thyromegaly present.  Cardiovascular: Normal rate, regular rhythm and normal heart sounds.  Pulmonary/Chest: Effort normal and breath sounds normal. No stridor.  Abdominal: Bowel sounds are normal.  Musculoskeletal: He exhibits no edema or tenderness.  Lymphadenopathy:    He has cervical adenopathy (fixed mass with mild ttp inferior to lft  mandible.).  Neurological: He is oriented to person, place, and time.  Skin: Skin is warm and dry. He is not diaphoretic.  Psychiatric: He has a normal mood and affect. His behavior is normal.          Assessment & Plan:  Will try a course of amoxil. Fu in 10 days to assess response. May need ent referral.

## 2017-10-03 NOTE — Addendum Note (Signed)
Addended by: Aviva Signs M on: 10/03/2017 04:05 PM   Modules accepted: Orders

## 2017-10-15 ENCOUNTER — Encounter: Payer: Self-pay | Admitting: Internal Medicine

## 2017-10-15 ENCOUNTER — Ambulatory Visit: Payer: BLUE CROSS/BLUE SHIELD | Admitting: Internal Medicine

## 2017-10-15 DIAGNOSIS — J029 Acute pharyngitis, unspecified: Secondary | ICD-10-CM

## 2017-10-15 MED ORDER — CEFDINIR 300 MG PO CAPS: 300.0000 mg | ORAL_CAPSULE | Freq: Two times a day (BID) | ORAL | 0 refills | Status: DC

## 2017-10-15 NOTE — Progress Notes (Signed)
Subjective:  Patient ID: Joe Hawkins, male    DOB: [birth year]  Age: 56 y.o. MRN: [id number]  CC: No chief complaint on file.   HPI Joe Hawkins presents for ST congestion and LNs swelling - not better w/Amoxicillin x 10 d  Outpatient Medications Prior to Visit  Medication Sig Dispense Refill  . EPINEPHrine (EPIPEN) 0.3 mg/0.3 mL DEVI Inject 0.3 mLs (0.3 mg total) into the muscle once. 1 Device 0  . levocetirizine (XYZAL) 5 MG tablet Take 1 tablet (5 mg total) by mouth every evening. 30 tablet 0  . nabumetone (RELAFEN) 500 MG tablet Take 1 tablet (500 mg total) by mouth daily as needed. 30 tablet 0  . Olopatadine HCl 0.2 % SOLN 1 drop in each eye every 8 hours as needed for itching. 2.5 mL 0  . amoxicillin (AMOXIL) 500 MG capsule Take 1 capsule (500 mg total) 3 (three) times daily by mouth. 30 capsule 0   No facility-administered medications prior to visit.     ROS Review of Systems  Constitutional: Negative for appetite change, fatigue and unexpected weight change.  HENT: Positive for congestion, postnasal drip, rhinorrhea, sinus pressure, sinus pain and sore throat. Negative for nosebleeds, sneezing, trouble swallowing and voice change.   Eyes: Negative for itching and visual disturbance.  Respiratory: Negative for cough.   Cardiovascular: Negative for chest pain, palpitations and leg swelling.  Gastrointestinal: Negative for abdominal distention, blood in stool, diarrhea and nausea.  Genitourinary: Negative for frequency and hematuria.  Musculoskeletal: Negative for back pain, gait problem, joint swelling and neck pain.  Skin: Negative for rash.  Neurological: Negative for dizziness, tremors, speech difficulty and weakness.  Hematological: Positive for adenopathy.  Psychiatric/Behavioral: Negative for agitation, dysphoric mood and sleep disturbance. The patient is not nervous/anxious.     Objective:  BP 126/84 (BP Location: Right Arm, Patient Position: Sitting, Cuff Size: Large)    Pulse 97   Temp 98.6 F (37 C) (Oral)   Ht 5\' 9"  (1.753 m)   Wt 231 lb (104.8 kg)   SpO2 98%   BMI 34.11 kg/m   BP Readings from Last 3 Encounters:  10/15/17 126/84  10/03/17 118/70  04/09/17 110/70    Wt Readings from Last 3 Encounters:  10/15/17 231 lb (104.8 kg)  10/03/17 230 lb (104.3 kg)  04/09/17 224 lb (101.6 kg)    Physical Exam  Constitutional: He is oriented to person, place, and time. He appears well-developed. No distress.  NAD  HENT:  Mouth/Throat: Oropharynx is clear and moist.  Eyes: Conjunctivae are normal. Pupils are equal, round, and reactive to light.  Neck: Normal range of motion. No JVD present. No thyromegaly present.  Cardiovascular: Normal rate, regular rhythm, normal heart sounds and intact distal pulses. Exam reveals no gallop and no friction rub.  No murmur heard. Pulmonary/Chest: Effort normal and breath sounds normal. No respiratory distress. He has no wheezes. He has no rales. He exhibits no tenderness.  Abdominal: Soft. Bowel sounds are normal. He exhibits no distension and no mass. There is no tenderness. There is no rebound and no guarding.  Musculoskeletal: Normal range of motion. He exhibits no edema or tenderness.  Lymphadenopathy:    He has no cervical adenopathy.  Neurological: He is alert and oriented to person, place, and time. He has normal reflexes. No cranial nerve deficit. He exhibits normal muscle tone. He displays a negative Romberg sign. Coordination and gait normal.  Skin: Skin is warm and dry. No rash noted.  Psychiatric:  He has a normal mood and affect. His behavior is normal. Judgment and thought content normal.  eryth throat L LN x1 is tender  Lab Results  Component Value Date   WBC 8.7 10/03/2017   HGB 14.9 10/03/2017   HCT 46.3 10/03/2017   PLT 234.0 10/03/2017   GLUCOSE 90 09/14/2015   ALT 57 (H) 12/25/2016   AST 62 (H) 12/25/2016   NA 140 09/14/2015   K 4.1 09/14/2015   CL 103 09/14/2015   CREATININE 0.82  09/14/2015   BUN 15 09/14/2015   CO2 25 09/14/2015   TSH 1.53 03/25/2007   HGBA1C 5.5 01/01/2017    US Abdomen Limited Ruq  Result Date: 01/07/2017 CLINICAL DATA:  Elevated LFTs, increased ferritin level EXAM: US ABDOMEN LIMITED - RIGHT UPPER QUADRANT COMPARISON:  None. FINDINGS: Gallbladder: No gallstones or wall thickening visualized. No sonographic Murphy sign noted by sonographer. Common bile duct: Diameter: Measures 4 mm in diameter. Liver: No focal lesion identified. Mild increased echogenicity of the liver suspicious for fatty infiltration or chronic liver disease. IMPRESSION: 1. No gallstones are noted within gallbladder. Normal CBD. No sonographic Murphy's sign. 2. No focal hepatic mass. Mild increased liver echogenicity suspicious for fatty infiltration or chronic liver disease. Electronically Signed   By: Lahoma Crocker M.D.   On: 01/07/2017 14:32    Assessment & Plan:   There are no diagnoses linked to this encounter. I have discontinued Alastor Due's amoxicillin. I am also having him maintain his EPINEPHrine, levocetirizine, Olopatadine HCl, and nabumetone.  No orders of the defined types were placed in this encounter.    Follow-up: No Follow-up on file.  Walker Kehr, MD

## 2017-10-15 NOTE — Assessment & Plan Note (Signed)
Reactive adenopathy on the L - tender, non-palpable PO Cefdinir ENT if not better

## 2017-11-23 ENCOUNTER — Encounter: Payer: Self-pay | Admitting: Family

## 2017-11-23 ENCOUNTER — Ambulatory Visit: Payer: BLUE CROSS/BLUE SHIELD | Admitting: Family

## 2017-11-23 VITALS — BP 120/84 | HR 67 | Temp 98.4°F | Wt 232.0 lb

## 2017-11-23 DIAGNOSIS — J309 Allergic rhinitis, unspecified: Secondary | ICD-10-CM

## 2017-11-23 DIAGNOSIS — J029 Acute pharyngitis, unspecified: Secondary | ICD-10-CM | POA: Diagnosis not present

## 2017-11-23 MED ORDER — OLOPATADINE HCL 0.2 % OP SOLN: OPHTHALMIC | 0 refills | Status: DC

## 2017-11-23 MED ORDER — FLUTICASONE PROPIONATE 50 MCG/ACT NA SUSP: 2.0000 | Freq: Every day | NASAL | 6 refills | Status: DC

## 2017-11-23 MED ORDER — LEVOCETIRIZINE DIHYDROCHLORIDE 5 MG PO TABS: 5.0000 mg | ORAL_TABLET | Freq: Every evening | ORAL | 0 refills | Status: DC

## 2017-11-23 NOTE — Progress Notes (Signed)
Joe Hawkins is a 57 y.o. male with the following history as recorded in [employer]:  Patient Active Problem List   Diagnosis Date Noted  . Pharyngitis 10/03/2017  . Lymphadenopathy of left cervical region 10/03/2017  . Eosinophilia 10/03/2017  . Acute sinus infection 11/24/2015  . Itching 11/11/2013  . Visual disturbance 09/09/2012  . Pterygium eye 09/09/2012  . Facial swelling 06/21/2012  . Eczema 05/07/2012  . Constipation 04/27/2011  . LYMPHADENITIS 11/29/2010  . MIGRAINE HEADACHE 11/10/2010  . OTITIS MEDIA 04/28/2010  . TINNITUS 03/23/2010  . Meralgia paresthetica 02/10/2010  . NECK PAIN 09/01/2009  . TOBACCO USE, QUIT 09/01/2009  . GERD 12/08/2008  . Sinusitis, chronic 10/31/2007  . Allergic rhinitis 10/31/2007  . LOW BACK PAIN 10/31/2007    Current Outpatient Medications  Medication Sig Dispense Refill  . EPINEPHrine (EPIPEN) 0.3 mg/0.3 mL DEVI Inject 0.3 mLs (0.3 mg total) into the muscle once. 1 Device 0  . nabumetone (RELAFEN) 500 MG tablet Take 1 tablet (500 mg total) by mouth daily as needed. 30 tablet 0  . Olopatadine HCl 0.2 % SOLN 1 drop in each eye every 8 hours as needed for itching. 2.5 mL 0  . fluticasone (FLONASE) 50 MCG/ACT nasal spray Place 2 sprays into both nostrils daily. 16 g 6  . levocetirizine (XYZAL) 5 MG tablet Take 1 tablet (5 mg total) by mouth every evening. 30 tablet 0   No current facility-administered medications for this visit.     Allergies: Shellfish allergy  Past Medical History:  Diagnosis Date  . Allergic rhinitis   . GERD (gastroesophageal reflux disease)   . Low back pain   . Sinusitis    Dr Constance Holster  . Tinnitus 2011    Past Surgical History:  Procedure Laterality Date  . BACK SURGERY  2007  . NASAL SINUS SURGERY  2008    Family History  Problem Relation Age of Onset  . Cancer Neg Hx     Social History   Tobacco Use  . Smoking status: Former Research scientist (life sciences)  . Smokeless tobacco: Never Used  Substance Use Topics  . Alcohol use:  No    Subjective:  Patient presents with 5 day history of cough/ congestion; has chronic allergies and not currently on any medication; has been using OTC Theraflu with little benefit; was seen twice in November with sore throat and completed Omnicef and Amoxicillin; feels like drainage in back of throat is problematic; denies any chest pain or shortness of breath; denies any fever;   Objective:  Vitals:   11/23/17 1211  BP: 120/84  Pulse: 67  Temp: 98.4 F (36.9 C)  TempSrc: Oral  SpO2: 94%  Weight: 232 lb (105.2 kg)    General: Well developed, well nourished, in no acute distress  Skin : Warm and dry.  Head: Normocephalic and atraumatic  Eyes: Sclera and conjunctiva clear; pupils round and reactive to light; extraocular movements intact  Ears: External normal; canals clear; tympanic membranes normal  Oropharynx: Pink, supple. No suspicious lesions  Neck: Supple without thyromegaly, adenopathy  Lungs: Respirations unlabored; clear to auscultation bilaterally without wheeze, rales, rhonchi  CVS exam: normal rate and regular rhythm.  Abdomen: Soft; nontender; nondistended; normoactive bowel sounds; no masses or hepatosplenomegaly  Musculoskeletal: No deformities; no active joint inflammation  Extremities: No edema, cyanosis, clubbing  Vessels: Symmetric bilaterally  Neurologic: Alert and oriented; speech intact; face symmetrical; moves all extremities well; CNII-XII intact without focal deficit   Assessment:  1. Allergic rhinitis, unspecified seasonality, unspecified  trigger   2. Pharyngitis, unspecified etiology     Plan:  Do not feel antibiotic warranted at this time; suspect uncontrolled allergies; refills given on Xyzal, Flonase and Olopatidine eye drops; discussed ENT referral- he declines; follow-up worse, no better.   No Follow-up on file.  No orders of the defined types were placed in this encounter.   Requested Prescriptions   Signed Prescriptions Disp Refills  .  Olopatadine HCl 0.2 % SOLN 2.5 mL 0    Sig: 1 drop in each eye every 8 hours as needed for itching.  . levocetirizine (XYZAL) 5 MG tablet 30 tablet 0    Sig: Take 1 tablet (5 mg total) by mouth every evening.  . fluticasone (FLONASE) 50 MCG/ACT nasal spray 16 g 6    Sig: Place 2 sprays into both nostrils daily.

## 2018-03-26 ENCOUNTER — Ambulatory Visit: Payer: BLUE CROSS/BLUE SHIELD | Admitting: Family Medicine

## 2018-03-26 ENCOUNTER — Encounter: Payer: Self-pay | Admitting: Family Medicine

## 2018-03-26 VITALS — BP 134/72 | HR 71 | Temp 98.2°F | Ht 69.0 in | Wt 224.0 lb

## 2018-03-26 DIAGNOSIS — G8929 Other chronic pain: Secondary | ICD-10-CM

## 2018-03-26 DIAGNOSIS — M545 Low back pain, unspecified: Secondary | ICD-10-CM

## 2018-03-26 DIAGNOSIS — J309 Allergic rhinitis, unspecified: Secondary | ICD-10-CM

## 2018-03-26 MED ORDER — LEVOCETIRIZINE DIHYDROCHLORIDE 5 MG PO TABS: 5.0000 mg | ORAL_TABLET | Freq: Every evening | ORAL | 3 refills | Status: DC

## 2018-03-26 MED ORDER — NABUMETONE 500 MG PO TABS: 500.0000 mg | ORAL_TABLET | Freq: Every day | ORAL | 1 refills | Status: DC | PRN

## 2018-03-26 NOTE — Progress Notes (Signed)
Joe Hawkins - 57 y.o. male MRN [id number]  Date of birth: 1961/07/08  SUBJECTIVE:  Including CC & ROS.  Chief Complaint  Patient presents with  . Cough    Joe Hawkins is a 57 y.o. male that is presenting with a cough. Ongoing for five days. Admits to productive cough with mucous. Denies fevers or body aches. He has been taking mucinex. Denies being around anyone with similar symptoms.   Has a history of low back pain that is chronic in nature.  He takes Relafen for this.  He has a history of microdiscectomy.  Denies any radicular symptoms.  Pain is mild and localized to the lower back.  Denies any changes in the pain.   Review of Systems  Constitutional: Negative for fever.  HENT: Positive for congestion and sinus pressure.   Respiratory: Negative for shortness of breath.   Cardiovascular: Negative for chest pain.  Gastrointestinal: Negative for abdominal pain.  Musculoskeletal: Positive for back pain.  Skin: Negative for color change.  Neurological: Negative for weakness.  Hematological: Negative for adenopathy.  Psychiatric/Behavioral: Negative for agitation.    HISTORY: Past Medical, Surgical, Social, and Family History Reviewed & Updated per EMR.   Pertinent Historical Findings include:  Past Medical History:  Diagnosis Date  . Allergic rhinitis   . GERD (gastroesophageal reflux disease)   . Low back pain   . Sinusitis    Dr Constance Holster  . Tinnitus 2011    Past Surgical History:  Procedure Laterality Date  . BACK SURGERY  2007  . NASAL SINUS SURGERY  2008    Allergies  Allergen Reactions  . Shellfish Allergy     Crab, lobsters and shrimp.     Family History  Problem Relation Age of Onset  . Cancer Neg Hx      Social History   Socioeconomic History  . Marital status: Single    Spouse name: Not on file  . Number of children: Not on file  . Years of education: Not on file  . Highest education level: Not on file  Occupational History  . Occupation: Archivist    Social Needs  . Financial resource strain: Not on file  . Food insecurity:    Worry: Not on file    Inability: Not on file  . Transportation needs:    Medical: Not on file    Non-medical: Not on file  Tobacco Use  . Smoking status: Former Research scientist (life sciences)  . Smokeless tobacco: Never Used  Substance and Sexual Activity  . Alcohol use: No  . Drug use: No  . Sexual activity: Yes  Lifestyle  . Physical activity:    Days per week: Not on file    Minutes per session: Not on file  . Stress: Not on file  Relationships  . Social connections:    Talks on phone: Not on file    Gets together: Not on file    Attends religious service: Not on file    Active member of club or organization: Not on file    Attends meetings of clubs or organizations: Not on file    Relationship status: Not on file  . Intimate partner violence:    Fear of current or ex partner: Not on file    Emotionally abused: Not on file    Physically abused: Not on file    Forced sexual activity: Not on file  Other Topics Concern  . Not on file  Social History Narrative   Married  PHYSICAL EXAM:  VS: BP 134/72 (BP Location: Left Arm, Patient Position: Sitting, Cuff Size: Normal)   Pulse 71   Temp 98.2 F (36.8 C) (Oral)   Ht 5\' 9"  (1.753 m)   Wt 224 lb (101.6 kg)   SpO2 96%   BMI 33.08 kg/m  Physical Exam Gen: NAD, alert, cooperative with exam,  ENT: normal lips, normal nasal mucosa, tympanic membranes clear and intact bilaterally, normal oropharynx, no cervical lymphadenopathy Eye: normal EOM, normal conjunctiva and lids CV:  no edema, +2 pedal pulses, regular rate and rhythm, S1-S2   Resp: no accessory muscle use, non-labored, clear to auscultation bilaterally, no crackles or wheezes Skin: no rashes, no areas of induration  Neuro: normal tone, normal sensation to touch Psych:  normal insight, alert and oriented MSK:  Back: No significant tenderness to palpation over the midline lumbar spine. Normal  internal and external rotation of the hips. Normal knee flexion and extension strength resistance. Normal plantar dorsiflexion strength resistance. Normal gait, Normal straight leg raise bilaterally. Neurovascularly intact      ASSESSMENT & PLAN:   Allergic rhinitis Symptoms either allergic or viral in nature.  No signs of fever. -Refilled Xyzal -Counseled on supportive care -Given indications to follow-up  LOW BACK PAIN Reports history of prior surgery and localized pain to the lower back -Refilled Relafen -Counseled on home exercise therapy -Could consider physical therapy if worsening

## 2018-03-26 NOTE — Patient Instructions (Signed)
Please try things such as zyrtec-D or allegra-D which is an antihistamine and decongestant.   Please try afrin which will help with nasal congestion but use for only three days.   Please also try using a netti pot on a regular occasion.  Honey can help with a sore throat.

## 2018-03-27 NOTE — Assessment & Plan Note (Signed)
Symptoms either allergic or viral in nature.  No signs of fever. -Refilled Xyzal -Counseled on supportive care -Given indications to follow-up

## 2018-03-27 NOTE — Assessment & Plan Note (Signed)
Reports history of prior surgery and localized pain to the lower back -Refilled Relafen -Counseled on home exercise therapy -Could consider physical therapy if worsening

## 2018-07-01 ENCOUNTER — Ambulatory Visit: Payer: BLUE CROSS/BLUE SHIELD | Admitting: Internal Medicine

## 2018-07-01 ENCOUNTER — Encounter: Payer: Self-pay | Admitting: Internal Medicine

## 2018-07-01 DIAGNOSIS — G8929 Other chronic pain: Secondary | ICD-10-CM

## 2018-07-01 DIAGNOSIS — M5416 Radiculopathy, lumbar region: Secondary | ICD-10-CM | POA: Diagnosis not present

## 2018-07-01 DIAGNOSIS — M545 Low back pain: Secondary | ICD-10-CM | POA: Diagnosis not present

## 2018-07-01 MED ORDER — PREDNISONE 10 MG PO TABS: ORAL_TABLET | ORAL | 1 refills | Status: DC

## 2018-07-01 MED ORDER — CYCLOBENZAPRINE HCL 5 MG PO TABS: 5.0000 mg | ORAL_TABLET | Freq: Three times a day (TID) | ORAL | 1 refills | Status: DC | PRN

## 2018-07-01 MED ORDER — VITAMIN D3 50 MCG (2000 UT) PO CAPS: 2000.0000 [IU] | ORAL_CAPSULE | Freq: Every day | ORAL | 3 refills | Status: DC

## 2018-07-01 NOTE — Assessment & Plan Note (Signed)
See above MRI if not better McKenzie pillow, inflatable Vit D3

## 2018-07-01 NOTE — Patient Instructions (Signed)
Sciatica Sciatica is pain, numbness, weakness, or tingling along your sciatic nerve. The sciatic nerve starts in the lower back and goes down the back of each leg. Sciatica happens when this nerve is pinched or has pressure put on it. Sciatica usually goes away on its own or with treatment. Sometimes, sciatica may keep coming back (recur). Follow these instructions at home: Medicines  Take over-the-counter and prescription medicines only as told by your doctor.  Do not drive or use heavy machinery while taking prescription pain medicine. Managing pain  If directed, put ice on the affected area. ? Put ice in a plastic bag. ? Place a towel between your skin and the bag. ? Leave the ice on for 20 minutes, 2-3 times a day.  After icing, apply heat to the affected area before you exercise or as often as told by your doctor. Use the heat source that your doctor tells you to use, such as a moist heat pack or a heating pad. ? Place a towel between your skin and the heat source. ? Leave the heat on for 20-30 minutes. ? Remove the heat if your skin turns bright red. This is especially important if you are unable to feel pain, heat, or cold. You may have a greater risk of getting burned. Activity  Return to your normal activities as told by your doctor. Ask your doctor what activities are safe for you. ? Avoid activities that make your sciatica worse.  Take short rests during the day. Rest in a lying or standing position. This is usually better than sitting to rest. ? When you rest for a long time, do some physical activity or stretching between periods of rest. ? Avoid sitting for a long time without moving. Get up and move around at least one time each hour.  Exercise and stretch regularly, as told by your doctor.  Do not lift anything that is heavier than 10 lb (4.5 kg) while you have symptoms of sciatica. ? Avoid lifting heavy things even when you do not have symptoms. ? Avoid lifting heavy  things over and over.  When you lift objects, always lift in a way that is safe for your body. To do this, you should: ? Bend your knees. ? Keep the object close to your body. ? Avoid twisting. General instructions  Use good posture. ? Avoid leaning forward when you are sitting. ? Avoid hunching over when you are standing.  Stay at a healthy weight.  Wear comfortable shoes that support your feet. Avoid wearing high heels.  Avoid sleeping on a mattress that is too soft or too hard. You might have less pain if you sleep on a mattress that is firm enough to support your back.  Keep all follow-up visits as told by your doctor. This is important. Contact a doctor if:  You have pain that: ? Wakes you up when you are sleeping. ? Gets worse when you lie down. ? Is worse than the pain you have had in the past. ? Lasts longer than 4 weeks.  You lose weight for without trying. Get help right away if:  You cannot control when you pee (urinate) or poop (have a bowel movement).  You have weakness in any of these areas and it gets worse. ? Lower back. ? Lower belly (pelvis). ? Butt (buttocks). ? Legs.  You have redness or swelling of your back.  You have a burning feeling when you pee. This information is not intended to replace   advice given to you by your health care provider. Make sure you discuss any questions you have with your health care provider. Document Released: 08/14/2008 Document Revised: 04/12/2016 Document Reviewed: 07/15/2015 Elsevier Interactive Patient Education  2018 [employer]  

## 2018-07-01 NOTE — Progress Notes (Signed)
Subjective:  Patient ID: Joe Hawkins, male    DOB: Jun 26, 1961  Age: 57 y.o. MRN: [id number]  CC: No chief complaint on file.   HPI Joe Hawkins presents for LBP irrad to the LLE down to the foot x7 days Hard to walk. He took OTC meds -- did not help Pain is 5/10 at rest. Worse w/sitting, lifting... 8/10 Flying to [place] on 07/28/18  Outpatient Medications Prior to Visit  Medication Sig Dispense Refill  . EPINEPHrine (EPIPEN) 0.3 mg/0.3 mL DEVI Inject 0.3 mLs (0.3 mg total) into the muscle once. 1 Device 0  . fluticasone (FLONASE) 50 MCG/ACT nasal spray Place 2 sprays into both nostrils daily. 16 g 6  . levocetirizine (XYZAL) 5 MG tablet Take 1 tablet (5 mg total) by mouth every evening. 30 tablet 3  . nabumetone (RELAFEN) 500 MG tablet Take 1 tablet (500 mg total) by mouth daily as needed. 30 tablet 1  . Olopatadine HCl 0.2 % SOLN 1 drop in each eye every 8 hours as needed for itching. 2.5 mL 0   No facility-administered medications prior to visit.     ROS: Review of Systems  Constitutional: Negative for appetite change, fatigue and unexpected weight change.  HENT: Negative for congestion, nosebleeds, sneezing, sore throat and trouble swallowing.   Eyes: Negative for itching and visual disturbance.  Respiratory: Negative for cough.   Cardiovascular: Negative for chest pain, palpitations and leg swelling.  Gastrointestinal: Negative for abdominal distention, blood in stool, diarrhea and nausea.  Genitourinary: Negative for frequency and hematuria.  Musculoskeletal: Positive for back pain and gait problem. Negative for joint swelling and neck pain.  Skin: Negative for rash.  Neurological: Negative for dizziness, tremors, speech difficulty and weakness.  Psychiatric/Behavioral: Negative for agitation, dysphoric mood and sleep disturbance. The patient is not nervous/anxious.     Objective:  BP 130/72 (BP Location: Left Arm, Patient Position: Sitting, Cuff Size: Large)   Pulse 65    Temp 98 F (36.7 C) (Oral)   Ht 5\' 9"  (1.753 m)   Wt 232 lb (105.2 kg)   SpO2 96%   BMI 34.26 kg/m   BP Readings from Last 3 Encounters:  07/01/18 130/72  03/26/18 134/72  11/23/17 120/84    Wt Readings from Last 3 Encounters:  07/01/18 232 lb (105.2 kg)  03/26/18 224 lb (101.6 kg)  11/23/17 232 lb (105.2 kg)    Physical Exam  Constitutional: He is oriented to person, place, and time. He appears well-developed. No distress.  NAD  HENT:  Mouth/Throat: Oropharynx is clear and moist.  Eyes: Pupils are equal, round, and reactive to light. Conjunctivae are normal.  Neck: Normal range of motion. No JVD present. No thyromegaly present.  Cardiovascular: Normal rate, regular rhythm, normal heart sounds and intact distal pulses. Exam reveals no gallop and no friction rub.  No murmur heard. Pulmonary/Chest: Effort normal and breath sounds normal. No respiratory distress. He has no wheezes. He has no rales. He exhibits no tenderness.  Abdominal: Soft. Bowel sounds are normal. He exhibits no distension and no mass. There is no tenderness. There is no rebound and no guarding.  Musculoskeletal: Normal range of motion. He exhibits tenderness. He exhibits no edema.  Lymphadenopathy:    He has no cervical adenopathy.  Neurological: He is alert and oriented to person, place, and time. He has normal reflexes. No cranial nerve deficit. He exhibits normal muscle tone. He displays a negative Romberg sign. Coordination abnormal. Gait normal.  Skin: Skin is warm  and dry. No rash noted.  Psychiatric: He has a normal mood and affect. His behavior is normal. Judgment and thought content normal.  LS tender w/restr ROM Str leg elev (+) on the L  Lab Results  Component Value Date   WBC 8.7 10/03/2017   HGB 14.9 10/03/2017   HCT 46.3 10/03/2017   PLT 234.0 10/03/2017   GLUCOSE 90 09/14/2015   ALT 57 (H) 12/25/2016   AST 62 (H) 12/25/2016   NA 140 09/14/2015   K 4.1 09/14/2015   CL 103 09/14/2015     CREATININE 0.82 09/14/2015   BUN 15 09/14/2015   CO2 25 09/14/2015   TSH 1.53 03/25/2007   HGBA1C 5.5 01/01/2017    US Abdomen Limited Ruq  Result Date: 01/07/2017 CLINICAL DATA:  Elevated LFTs, increased ferritin level EXAM: US ABDOMEN LIMITED - RIGHT UPPER QUADRANT COMPARISON:  None. FINDINGS: Gallbladder: No gallstones or wall thickening visualized. No sonographic Murphy sign noted by sonographer. Common bile duct: Diameter: Measures 4 mm in diameter. Liver: No focal lesion identified. Mild increased echogenicity of the liver suspicious for fatty infiltration or chronic liver disease. IMPRESSION: 1. No gallstones are noted within gallbladder. Normal CBD. No sonographic Murphy's sign. 2. No focal hepatic mass. Mild increased liver echogenicity suspicious for fatty infiltration or chronic liver disease. Electronically Signed   By: Lahoma Crocker M.D.   On: 01/07/2017 14:32    Assessment & Plan:   There are no diagnoses linked to this encounter.   No orders of the defined types were placed in this encounter.    Follow-up: No follow-ups on file.  Walker Kehr, MD

## 2018-07-01 NOTE — Assessment & Plan Note (Signed)
Prednisone 10 mg: take 4 tabs a day x 3 days; then 3 tabs a day x 4 days; then 2 tabs a day x 4 days, then 1 tab a day x 6 days, then stop. Take pc.  Potential benefits of a short term steroid  use as well as potential risks  and complications were explained to the patient and were aknowledged. Flexeril prn MRI if not better

## 2019-09-14 DIAGNOSIS — M129 Arthropathy, unspecified: Secondary | ICD-10-CM | POA: Insufficient documentation

## 2020-02-22 ENCOUNTER — Ambulatory Visit (INDEPENDENT_AMBULATORY_CARE_PROVIDER_SITE_OTHER): Payer: 59 | Admitting: Internal Medicine

## 2020-02-22 ENCOUNTER — Encounter: Payer: Self-pay | Admitting: Internal Medicine

## 2020-02-22 ENCOUNTER — Other Ambulatory Visit: Payer: Self-pay

## 2020-02-22 DIAGNOSIS — J309 Allergic rhinitis, unspecified: Secondary | ICD-10-CM | POA: Diagnosis not present

## 2020-02-22 DIAGNOSIS — H1010 Acute atopic conjunctivitis, unspecified eye: Secondary | ICD-10-CM | POA: Insufficient documentation

## 2020-02-22 DIAGNOSIS — M5416 Radiculopathy, lumbar region: Secondary | ICD-10-CM

## 2020-02-22 DIAGNOSIS — H1013 Acute atopic conjunctivitis, bilateral: Secondary | ICD-10-CM

## 2020-02-22 DIAGNOSIS — R21 Rash and other nonspecific skin eruption: Secondary | ICD-10-CM | POA: Insufficient documentation

## 2020-02-22 MED ORDER — FLUTICASONE PROPIONATE 50 MCG/ACT NA SUSP
2.0000 | Freq: Every day | NASAL | 6 refills | Status: DC
Start: 2020-02-22 — End: 2021-05-05

## 2020-02-22 MED ORDER — CYCLOBENZAPRINE HCL 5 MG PO TABS: 5.0000 mg | ORAL_TABLET | Freq: Three times a day (TID) | ORAL | 1 refills | Status: DC | PRN

## 2020-02-22 MED ORDER — LEVOCETIRIZINE DIHYDROCHLORIDE 5 MG PO TABS: 5.0000 mg | ORAL_TABLET | Freq: Every evening | ORAL | 3 refills | Status: DC

## 2020-02-22 MED ORDER — CLOTRIMAZOLE-BETAMETHASONE 1-0.05 % EX CREA: 1.0000 "application " | TOPICAL_CREAM | Freq: Two times a day (BID) | CUTANEOUS | 1 refills | Status: AC

## 2020-02-22 MED ORDER — OLOPATADINE HCL 0.2 % OP SOLN: OPHTHALMIC | 1 refills | Status: DC

## 2020-02-22 NOTE — Assessment & Plan Note (Signed)
Flexeril prn Vit d

## 2020-02-22 NOTE — Assessment & Plan Note (Signed)
numular lesions R dist shin Lotrisone Derm ref if not better No pets at home

## 2020-02-22 NOTE — Assessment & Plan Note (Signed)
Xyzal Olopatadine gtt

## 2020-02-22 NOTE — Progress Notes (Signed)
Subjective:  Patient ID: Joe Hawkins, male    DOB: [birth year]  Age: 59 y.o. MRN: [id number]  CC: No chief complaint on file.   HPI Joe Hawkins presents for eye irritation in am - allergies C/o rash on R dist shin 8 mo - itchy F/u LBP   Outpatient Medications Prior to Visit  Medication Sig Dispense Refill  . Cholecalciferol (VITAMIN D3) 2000 units capsule Take 1 capsule (2,000 Units total) by mouth daily. 100 capsule 3  . cyclobenzaprine (FLEXERIL) 5 MG tablet Take 1-2 tablets (5-10 mg total) by mouth 3 (three) times daily as needed for muscle spasms. 30 tablet 1  . EPINEPHrine (EPIPEN) 0.3 mg/0.3 mL DEVI Inject 0.3 mLs (0.3 mg total) into the muscle once. 1 Device 0  . fluticasone (FLONASE) 50 MCG/ACT nasal spray Place 2 sprays into both nostrils daily. 16 g 6  . levocetirizine (XYZAL) 5 MG tablet Take 1 tablet (5 mg total) by mouth every evening. 30 tablet 3  . Olopatadine HCl 0.2 % SOLN 1 drop in each eye every 8 hours as needed for itching. 2.5 mL 0  . predniSONE (DELTASONE) 10 MG tablet Prednisone 10 mg: take 4 tabs a day x 3 days; then 3 tabs a day x 4 days; then 2 tabs a day x 4 days, then 1 tab a day x 6 days, then stop. Take pc. 38 tablet 1   No facility-administered medications prior to visit.    ROS: Review of Systems  Constitutional: Negative for appetite change, fatigue and unexpected weight change.  HENT: Positive for congestion. Negative for nosebleeds, sneezing, sore throat and trouble swallowing.   Eyes: Positive for itching. Negative for redness and visual disturbance.  Respiratory: Negative for cough.   Cardiovascular: Negative for chest pain, palpitations and leg swelling.  Gastrointestinal: Negative for abdominal distention, blood in stool, diarrhea and nausea.  Genitourinary: Negative for frequency and hematuria.  Musculoskeletal: Positive for back pain. Negative for gait problem, joint swelling and neck pain.  Skin: Positive for color change and rash.    Neurological: Negative for dizziness, tremors, speech difficulty and weakness.  Psychiatric/Behavioral: Negative for agitation, dysphoric mood and sleep disturbance. The patient is not nervous/anxious.     Objective:  There were no vitals taken for this visit.  BP Readings from Last 3 Encounters:  07/01/18 130/72  03/26/18 134/72  11/23/17 120/84    Wt Readings from Last 3 Encounters:  07/01/18 232 lb (105.2 kg)  03/26/18 224 lb (101.6 kg)  11/23/17 232 lb (105.2 kg)    Physical Exam Constitutional:      General: He is not in acute distress.    Appearance: He is well-developed.     Comments: NAD  Eyes:     Conjunctiva/sclera: Conjunctivae normal.     Pupils: Pupils are equal, round, and reactive to light.  Neck:     Thyroid: No thyromegaly.     Vascular: No JVD.  Cardiovascular:     Rate and Rhythm: Normal rate and regular rhythm.     Heart sounds: Normal heart sounds. No murmur. No friction rub. No gallop.   Pulmonary:     Effort: Pulmonary effort is normal. No respiratory distress.     Breath sounds: Normal breath sounds. No wheezing or rales.  Chest:     Chest wall: No tenderness.  Abdominal:     General: Bowel sounds are normal. There is no distension.     Palpations: Abdomen is soft. There is no mass.  Tenderness: There is no abdominal tenderness. There is no guarding or rebound.  Musculoskeletal:        General: No tenderness. Normal range of motion.     Cervical back: Normal range of motion.  Lymphadenopathy:     Cervical: No cervical adenopathy.  Skin:    General: Skin is warm and dry.     Findings: Rash present.  Neurological:     Mental Status: He is alert and oriented to person, place, and time.     Cranial Nerves: No cranial nerve deficit.     Motor: No abnormal muscle tone.     Coordination: Coordination normal.     Gait: Gait normal.     Deep Tendon Reflexes: Reflexes are normal and symmetric.  Psychiatric:        Behavior: Behavior normal.         Thought Content: Thought content normal.        Judgment: Judgment normal.    R dist shin w/numular type eczematous lesions - on 3 cm in diameter and dark, 3 other - faint and small  Lab Results  Component Value Date   WBC 8.7 10/03/2017   HGB 14.9 10/03/2017   HCT 46.3 10/03/2017   PLT 234.0 10/03/2017   GLUCOSE 90 09/14/2015   ALT 57 (H) 12/25/2016   AST 62 (H) 12/25/2016   NA 140 09/14/2015   K 4.1 09/14/2015   CL 103 09/14/2015   CREATININE 0.82 09/14/2015   BUN 15 09/14/2015   CO2 25 09/14/2015   TSH 1.53 03/25/2007   HGBA1C 5.5 01/01/2017    US Abdomen Limited RUQ  Result Date: 01/07/2017 CLINICAL DATA:  Elevated LFTs, increased ferritin level EXAM: US ABDOMEN LIMITED - RIGHT UPPER QUADRANT COMPARISON:  None. FINDINGS: Gallbladder: No gallstones or wall thickening visualized. No sonographic Murphy sign noted by sonographer. Common bile duct: Diameter: Measures 4 mm in diameter. Liver: No focal lesion identified. Mild increased echogenicity of the liver suspicious for fatty infiltration or chronic liver disease. IMPRESSION: 1. No gallstones are noted within gallbladder. Normal CBD. No sonographic Murphy's sign. 2. No focal hepatic mass. Mild increased liver echogenicity suspicious for fatty infiltration or chronic liver disease. Electronically Signed   By: Lahoma Crocker M.D.   On: 01/07/2017 14:32    Assessment & Plan:    Walker Kehr, MD

## 2020-09-26 ENCOUNTER — Ambulatory Visit: Payer: 59 | Admitting: Internal Medicine

## 2021-04-13 ENCOUNTER — Telehealth (INDEPENDENT_AMBULATORY_CARE_PROVIDER_SITE_OTHER): Payer: 59 | Admitting: Internal Medicine

## 2021-04-13 ENCOUNTER — Encounter: Payer: Self-pay | Admitting: Internal Medicine

## 2021-04-13 DIAGNOSIS — J019 Acute sinusitis, unspecified: Secondary | ICD-10-CM

## 2021-04-13 MED ORDER — AMOXICILLIN-POT CLAVULANATE 875-125 MG PO TABS: 1.0000 | ORAL_TABLET | Freq: Two times a day (BID) | ORAL | 0 refills | Status: DC

## 2021-04-13 MED ORDER — AMOXICILLIN-POT CLAVULANATE 875-125 MG PO TABS: 1.0000 | ORAL_TABLET | Freq: Two times a day (BID) | ORAL | 0 refills | Status: AC

## 2021-04-13 NOTE — Progress Notes (Signed)
Virtual Visit via Telephone Note  I connected with Joe Hawkins on 04/13/21 at  4:00 PM EDT by telephone and verified that I am speaking with the correct person using two identifiers.  Location: Patient: Home Provider: Home office  No one else is present in the visit I discussed the limitations, risks, security and privacy concerns of performing an evaluation and management service by telephone and the availability of in person appointments. I also discussed with the patient that there may be a patient responsible charge related to this service. The patient expressed understanding and agreed to proceed.   History of Present Illness:  The patient is complaining of sinus congestion and headache, similar to what he has with his recurrent sinus infection.  He has been sick for a few days.  No fever.  He has not done a COVID test.  Observations/Objective:  He sounds normal on the phone Assessment and Plan:  See plan Follow Up Instructions:    I discussed the assessment and treatment plan with the patient. The patient was provided an opportunity to ask questions and all were answered. The patient agreed with the plan and demonstrated an understanding of the instructions.   The patient was advised to call back or seek an in-person evaluation if the symptoms worsen or if the condition fails to improve as anticipated.  I provided 12 minutes of non-face-to-face time during this encounter.   Walker Kehr, MD

## 2021-04-13 NOTE — Assessment & Plan Note (Signed)
Prescribed Augmentin for 10 days.  She was advised to do COVID-19 test just in case.

## 2021-05-02 ENCOUNTER — Telehealth (INDEPENDENT_AMBULATORY_CARE_PROVIDER_SITE_OTHER): Payer: 59 | Admitting: Internal Medicine

## 2021-05-02 ENCOUNTER — Encounter: Payer: Self-pay | Admitting: Internal Medicine

## 2021-05-02 VITALS — Ht 69.0 in | Wt 225.0 lb

## 2021-05-02 DIAGNOSIS — R5383 Other fatigue: Secondary | ICD-10-CM

## 2021-05-02 DIAGNOSIS — R519 Headache, unspecified: Secondary | ICD-10-CM

## 2021-05-02 DIAGNOSIS — R42 Dizziness and giddiness: Secondary | ICD-10-CM | POA: Diagnosis not present

## 2021-05-02 NOTE — Progress Notes (Signed)
Subjective:    Patient ID: Joe Hawkins, male    DOB: 1960/12/06, 60 y.o.   MRN: [id number]  DOS:  05/02/2021 Type of visit - description: Virtual Visit via Video Note  I connected with the above patient  by a video enabled telemedicine application and verified that I am speaking with the correct person using two identifiers.   THIS ENCOUNTER IS A VIRTUAL VISIT DUE TO COVID-19 - PATIENT WAS NOT SEEN IN THE OFFICE. PATIENT HAS CONSENTED TO VIRTUAL VISIT / TELEMEDICINE VISIT   Location of patient: home  Location of provider: office  Persons participating in the virtual visit: patient, provider   I discussed the limitations of evaluation and management by telemedicine and the availability of in person appointments. The patient expressed understanding and agreed to proceed.   Acute Patient was seen by PCP 04-13-2021, had sinus congestion and a headache, was prescribed Augmentin. The patient self tested for COVID 04/19/2021, test came back positive, did not notify PCP  He is here today because he has a number of symptoms: Occasional dizzy when he gets up, some headache, fatigue.  Denies any fever chills + Nausea, no vomiting. Has some ill-defined abdominal pain No diarrhea No dysuria or blood in the urine  Review of Systems See above   Past Medical History:  Diagnosis Date   Allergic rhinitis    GERD (gastroesophageal reflux disease)    Low back pain    Sinusitis    Dr Constance Holster   Tinnitus 2011    Past Surgical History:  Procedure Laterality Date   BACK SURGERY  2007   NASAL SINUS SURGERY  2008    Allergies as of 05/02/2021       Reactions   Shellfish Allergy    Crab, lobsters and shrimp.         Medication List        Accurate as of May 02, 2021 11:59 PM. If you have any questions, ask your nurse or doctor.          cyclobenzaprine 5 MG tablet Commonly known as: FLEXERIL Take 1-2 tablets (5-10 mg total) by mouth 3 (three) times daily as needed for muscle  spasms.   EPINEPHrine 0.3 mg/0.3 mL Devi Commonly known as: EpiPen Inject 0.3 mLs (0.3 mg total) into the muscle once.   fluticasone 50 MCG/ACT nasal spray Commonly known as: FLONASE Place 2 sprays into both nostrils daily.   levocetirizine 5 MG tablet Commonly known as: Xyzal Take 1 tablet (5 mg total) by mouth every evening.   Olopatadine HCl 0.2 % Soln 1 drop in each eye every 8 hours as needed for itching.   Vitamin D3 50 MCG (2000 UT) capsule Take 1 capsule (2,000 Units total) by mouth daily.           Objective:   Physical Exam Ht 5\' 9"  (1.753 m)   Wt 225 lb (102.1 kg)   BMI 33.23 kg/m  This is a virtual video visit, he declined to have a interpreter.  Communication was somewhat limited by the language barrier.  He looks well, in no distress, face symmetric, with his eyes without any problems. Speech is clear and fluent.  No cough or sinus/chest congestion noted.    Assessment    Dizziness, headache, fatigue 60 year old gentleman, had apparently a sinus infection mid June, treated with antibiotics by PCP. Self checked for COVID on 04/19/2021, test came back +, did not notify PCP, did not get any specific treatment. Now presents with  several symptoms as described above. He does not look toxic. Advised patient that is hard to assess his several symptoms over video visit, recommend to call PCP in the morning and get in person appointment.  I sent Dr. Alain Marion a message regards this visit    I discussed the assessment and treatment plan with the patient. The patient was provided an opportunity to ask questions and all were answered. The patient agreed with the plan and demonstrated an understanding of the instructions.   The patient was advised to call back or seek an in-person evaluation if the symptoms worsen or if the condition fails to improve as anticipated.

## 2021-05-05 ENCOUNTER — Ambulatory Visit (INDEPENDENT_AMBULATORY_CARE_PROVIDER_SITE_OTHER): Payer: 59 | Admitting: Internal Medicine

## 2021-05-05 ENCOUNTER — Encounter: Payer: Self-pay | Admitting: Internal Medicine

## 2021-05-05 ENCOUNTER — Other Ambulatory Visit: Payer: Self-pay

## 2021-05-05 VITALS — BP 116/74 | HR 74 | Temp 98.4°F | Ht 69.0 in | Wt 234.2 lb

## 2021-05-05 DIAGNOSIS — Z13220 Encounter for screening for lipoid disorders: Secondary | ICD-10-CM | POA: Diagnosis not present

## 2021-05-05 DIAGNOSIS — R5383 Other fatigue: Secondary | ICD-10-CM

## 2021-05-05 DIAGNOSIS — Z860100 Personal history of colon polyps, unspecified: Secondary | ICD-10-CM | POA: Insufficient documentation

## 2021-05-05 DIAGNOSIS — U099 Post covid-19 condition, unspecified: Secondary | ICD-10-CM | POA: Diagnosis not present

## 2021-05-05 LAB — COMPREHENSIVE METABOLIC PANEL
ALT: 41 U/L (ref 0–53)
AST: 35 U/L (ref 0–37)
Albumin: 4.2 g/dL (ref 3.5–5.2)
Alkaline Phosphatase: 57 U/L (ref 39–117)
BUN: 16 mg/dL (ref 6–23)
CO2: 28 mEq/L (ref 19–32)
Calcium: 9.5 mg/dL (ref 8.4–10.5)
Chloride: 102 mEq/L (ref 96–112)
Creatinine, Ser: 1.01 mg/dL (ref 0.40–1.50)
GFR: 79.82 mL/min (ref >60.00)
Glucose, Bld: 103 mg/dL — ABNORMAL HIGH (ref 70–99)
Potassium: 3.9 mEq/L (ref 3.5–5.1)
Sodium: 141 mEq/L (ref 135–145)
Total Bilirubin: 0.7 mg/dL (ref 0.2–1.2)
Total Protein: 7.4 g/dL (ref 6.0–8.3)

## 2021-05-05 LAB — TSH: TSH: 1.3 u[IU]/mL (ref 0.35–4.50)

## 2021-05-05 LAB — LIPID PANEL
Cholesterol: 195 mg/dL (ref 0–200)
HDL: 47.6 mg/dL (ref >39.00)
LDL Cholesterol: 117 mg/dL — ABNORMAL HIGH (ref 0–99)
NonHDL: 147.5
Total CHOL/HDL Ratio: 4
Triglycerides: 154 mg/dL — ABNORMAL HIGH (ref 0.0–149.0)
VLDL: 30.8 mg/dL (ref 0.0–40.0)

## 2021-05-05 LAB — CBC
HCT: 44.1 % (ref 39.0–52.0)
Hemoglobin: 14.8 g/dL (ref 13.0–17.0)
MCHC: 33.5 g/dL (ref 30.0–36.0)
MCV: 77.5 fl — ABNORMAL LOW (ref 78.0–100.0)
Platelets: 220 10*3/uL (ref 150.0–400.0)
RBC: 5.69 Mil/uL (ref 4.22–5.81)
RDW: 13.5 % (ref 11.5–15.5)
WBC: 10 10*3/uL (ref 4.0–10.5)

## 2021-05-05 LAB — VITAMIN D 25 HYDROXY (VIT D DEFICIENCY, FRACTURES): VITD: 22.2 ng/mL — ABNORMAL LOW (ref 30.00–100.00)

## 2021-05-05 LAB — VITAMIN B12: Vitamin B-12: 571 pg/mL (ref 211–911)

## 2021-05-05 MED ORDER — MONTELUKAST SODIUM 10 MG PO TABS: 10.0000 mg | ORAL_TABLET | Freq: Every day | ORAL | 3 refills | Status: DC

## 2021-05-05 MED ORDER — FLUTICASONE PROPIONATE 50 MCG/ACT NA SUSP: 2.0000 | Freq: Every day | NASAL | 6 refills | Status: DC

## 2021-05-05 NOTE — Patient Instructions (Signed)
We will check the labs today and get you in for the colon check.  We have sent in flonase which is the nose spray to help with the drainage and headaches. We have also sent in singulair which is a pill to take daily to help with drainage also.  The tiredness will likely get better in the next few weeks.

## 2021-05-05 NOTE — Assessment & Plan Note (Signed)
Rx singulair and flonase for drainage. Checking labs to exclude other etiology including CBC, CMP, TSH, B12, vitamin D.

## 2021-05-05 NOTE — Assessment & Plan Note (Signed)
Checking CBC, CMP, vitamin D, B12, TSH for underlying cause. May be related to recent covid-19.

## 2021-05-05 NOTE — Assessment & Plan Note (Signed)
Records in care everywhere indicate he was recommended repeat colonoscopy. Several polyps removed and per path there appear to have been hyperplastic. Referral done to local GI who will be in network for patient.

## 2021-05-05 NOTE — Progress Notes (Signed)
   Subjective:   Patient ID: Joe Hawkins, male    DOB: 06/20/1961, 60 y.o.   MRN: [id number]  HPI The patient is a 60 YO man coming in for concerns about fatigue (tired since covid-19, no labs in some time, denies blood in stool, denies chest pains or SOB) and recent covid-19 (positive April 19, 2021, overall feeling improvement, did have drainage and cough, now with some sinus pressure, some headaches, and fatigue, no SOB or cough now) and colon screening (last July 2020 and was supposed to go back in 1 year but this place would not take his insurance, he was unable to have the screening, records in care everywhere). Interpretor present during visit.   Review of Systems  Constitutional:  Positive for fatigue.  HENT: Negative.    Eyes: Negative.   Respiratory:  Negative for chest tightness and shortness of breath.   Cardiovascular:  Negative for chest pain, palpitations and leg swelling.  Gastrointestinal:  Negative for abdominal distention, constipation and diarrhea.  Musculoskeletal: Negative.   Skin: Negative.   Neurological:  Positive for dizziness and headaches.  Psychiatric/Behavioral: Negative.     Objective:  Physical Exam Constitutional:      Appearance: He is well-developed.  HENT:     Head: Normocephalic and atraumatic.  Eyes:     Comments: Bilateral TM bulging clear fluid  Cardiovascular:     Rate and Rhythm: Normal rate and regular rhythm.  Pulmonary:     Effort: Pulmonary effort is normal. No respiratory distress.     Breath sounds: Normal breath sounds. No wheezing or rales.  Abdominal:     General: Bowel sounds are normal. There is no distension.     Palpations: Abdomen is soft.     Tenderness: There is no abdominal tenderness. There is no rebound.  Musculoskeletal:     Cervical back: Normal range of motion.  Skin:    General: Skin is warm and dry.  Neurological:     Mental Status: He is alert and oriented to person, place, and time.     Coordination:  Coordination normal.    Vitals:   05/05/21 1030  BP: 116/74  Pulse: 74  Temp: 98.4 F (36.9 C)  TempSrc: Oral  SpO2: 98%  Weight: 234 lb 3.2 oz (106.2 kg)  Height: 5\' 9"  (1.753 m)    This visit occurred during the SARS-CoV-2 public health emergency.  Safety protocols were in place, including screening questions prior to the visit, additional usage of staff PPE, and extensive cleaning of exam room while observing appropriate contact time as indicated for disinfecting solutions.   Assessment & Plan:

## 2021-07-26 DIAGNOSIS — Z1211 Encounter for screening for malignant neoplasm of colon: Secondary | ICD-10-CM | POA: Insufficient documentation

## 2021-09-28 ENCOUNTER — Other Ambulatory Visit: Payer: Self-pay

## 2021-09-28 ENCOUNTER — Encounter: Payer: Self-pay | Admitting: Internal Medicine

## 2021-09-28 ENCOUNTER — Ambulatory Visit (INDEPENDENT_AMBULATORY_CARE_PROVIDER_SITE_OTHER): Payer: 59 | Admitting: Internal Medicine

## 2021-09-28 VITALS — BP 100/60 | HR 58 | Temp 98.6°F | Ht 69.0 in | Wt 223.0 lb

## 2021-09-28 DIAGNOSIS — M5416 Radiculopathy, lumbar region: Secondary | ICD-10-CM | POA: Diagnosis not present

## 2021-09-28 DIAGNOSIS — J309 Allergic rhinitis, unspecified: Secondary | ICD-10-CM | POA: Diagnosis not present

## 2021-09-28 DIAGNOSIS — K219 Gastro-esophageal reflux disease without esophagitis: Secondary | ICD-10-CM

## 2021-09-28 MED ORDER — PREDNISONE 10 MG PO TABS: ORAL_TABLET | ORAL | 0 refills | Status: DC

## 2021-09-28 MED ORDER — MONTELUKAST SODIUM 10 MG PO TABS: 10.0000 mg | ORAL_TABLET | Freq: Every day | ORAL | 3 refills | Status: DC

## 2021-09-28 MED ORDER — LEVOCETIRIZINE DIHYDROCHLORIDE 5 MG PO TABS: 5.0000 mg | ORAL_TABLET | Freq: Every evening | ORAL | 3 refills | Status: DC

## 2021-09-28 MED ORDER — HYDROCODONE-ACETAMINOPHEN 7.5-325 MG PO TABS: 1.0000 | ORAL_TABLET | Freq: Four times a day (QID) | ORAL | 0 refills | Status: DC | PRN

## 2021-09-28 NOTE — Patient Instructions (Signed)
Please take all new medication as prescribed - the pain medication as needed, and the prednisone  Please call for more pain medication if needed after the first week, as we are only allowed to give 1 week to start  Please continue all other medications as before, and refills have been done if requested - the allergy medications (singulair and xyzal)  Please have the pharmacy call with any other refills you may need.  Please keep your appointments with your specialists as you may have planned  You will be contacted regarding the referral for: MRI for the lower back, and Neurosurgury referral

## 2021-09-28 NOTE — Progress Notes (Signed)
Patient ID: Joe Hawkins, male   DOB: 15-Dec-1960, 60 y.o.   MRN: [id number]        Chief Complaint: follow up low back and left leg pain and weakness       HPI:  Joe Hawkins is a 60 y.o. male here with hx of symptoms similar per pt to prior episode over 10 yrs ago leading to ls spine surgury but o/w not sure of the details; today c/o 4 days onset acute left lower back pain with radiation to the LLE with numbness and mild weakness, severe, intermittent, worse with walking and standing and turning over in bed, better to lie down flat; no falls but + gait change and hard to get in the car; Denies urinary symptoms such as dysuria, frequency, urgency, flank pain, hematuria or n/v, fever, chills.  Denies worsening reflux, abd pain, dysphagia, n/v, bowel change or blood.  Pain today about 9/10.  Pt denies chest pain, increased sob or doe, wheezing, orthopnea, PND, increased LE swelling, palpitations, dizziness or syncope.   Also, Does have several wks ongoing nasal allergy symptoms with clearish congestion, itch and sneezing, without fever, pain, ST, cough, swelling or wheezing.       Wt Readings from Last 3 Encounters:  09/28/21 223 lb (101.2 kg)  05/05/21 234 lb 3.2 oz (106.2 kg)  05/02/21 225 lb (102.1 kg)   BP Readings from Last 3 Encounters:  09/28/21 100/60  05/05/21 116/74  02/22/20 118/82         Past Medical History:  Diagnosis Date   Allergic rhinitis    GERD (gastroesophageal reflux disease)    Low back pain    Sinusitis    Dr Constance Holster   Tinnitus 2011   Past Surgical History:  Procedure Laterality Date   BACK SURGERY  2007   NASAL SINUS SURGERY  2008    reports that he has quit smoking. He has never used smokeless tobacco. He reports that he does not drink alcohol and does not use drugs. family history is not on file. Allergies  Allergen Reactions   Shellfish Allergy     Crab, lobsters and shrimp.    Current Outpatient Medications on File Prior to Visit  Medication Sig Dispense  Refill   Cholecalciferol (VITAMIN D3) 2000 units capsule Take 1 capsule (2,000 Units total) by mouth daily. 100 capsule 3   cyclobenzaprine (FLEXERIL) 5 MG tablet Take 1-2 tablets (5-10 mg total) by mouth 3 (three) times daily as needed for muscle spasms. 30 tablet 1   EPINEPHrine (EPIPEN) 0.3 mg/0.3 mL DEVI Inject 0.3 mLs (0.3 mg total) into the muscle once. 1 Device 0   fluticasone (FLONASE) 50 MCG/ACT nasal spray Place 2 sprays into both nostrils daily. 16 g 6   Olopatadine HCl 0.2 % SOLN 1 drop in each eye every 8 hours as needed for itching. 2.5 mL 1   No current facility-administered medications on file prior to visit.        ROS:  All others reviewed and negative.  Objective        PE:  BP 100/60 (BP Location: Right Arm, Patient Position: Sitting, Cuff Size: Large)   Pulse (!) 58   Temp 98.6 F (37 C) (Oral)   Ht 5\' 9"  (1.753 m)   Wt 223 lb (101.2 kg)   SpO2 96%   BMI 32.93 kg/m                 Constitutional: Pt appears in NAD  HENT: Head: NCAT.                Right Ear: External ear normal.                 Left Ear: External ear normal.                Eyes: . Pupils are equal, round, and reactive to light. Conjunctivae and EOM are normal               Nose: without d/c or deformity               Neck: Neck supple. Gross normal ROM               Cardiovascular: Normal rate and regular rhythm.                 Pulmonary/Chest: Effort normal and breath sounds without rales or wheezing.                Abd:  Soft, NT, ND, + BS, no organomegaly               Neurological: Pt is alert. At baseline orientation, motor with 3+/5 LLE weakness and decreased sensation to LT; spine nontender in the midline but points to low left lumbar paravertebral where pain starts               Skin: Skin is warm. No rashes, no other new lesions, LE edema - none               Psychiatric: Pt behavior is normal without agitation   Micro: none  Cardiac tracings I have personally  interpreted today:  none  Pertinent Radiological findings (summarize): none   Lab Results  Component Value Date   WBC 10.0 05/05/2021   HGB 14.8 05/05/2021   HCT 44.1 05/05/2021   PLT 220.0 05/05/2021   GLUCOSE 103 (H) 05/05/2021   CHOL 195 05/05/2021   TRIG 154.0 (H) 05/05/2021   HDL 47.60 05/05/2021   LDLCALC 117 (H) 05/05/2021   ALT 41 05/05/2021   AST 35 05/05/2021   NA 141 05/05/2021   K 3.9 05/05/2021   CL 102 05/05/2021   CREATININE 1.01 05/05/2021   BUN 16 05/05/2021   CO2 28 05/05/2021   TSH 1.30 05/05/2021   HGBA1C 5.5 01/01/2017   Assessment/Plan:  Joe Hawkins is a 60 y.o. Asian [4] male with  has a past medical history of Allergic rhinitis, GERD (gastroesophageal reflux disease), Low back pain, Sinusitis, and Tinnitus (2011).  Allergic rhinitis Mod uncontrolled, out of meds - for singulari and xyzal restart, for predpac asd,  to f/u any worsening symptoms or concerns  GERD Stable, to continue tums prn,  to f/u any worsening symptoms or concerns  Left lumbar radiculopathy New onset severe with neuro change, for vicodin prn, prednisone asd trial, MRI ls spine asap, and refer NS,  to f/u any worsening symptoms or concerns  Followup: No follow-ups on file.  Cathlean Cower, MD 10/01/2021 5:50 PM [facility]

## 2021-10-01 ENCOUNTER — Encounter: Payer: Self-pay | Admitting: Internal Medicine

## 2021-10-01 DIAGNOSIS — M5416 Radiculopathy, lumbar region: Secondary | ICD-10-CM | POA: Insufficient documentation

## 2021-10-01 NOTE — Assessment & Plan Note (Signed)
New onset severe with neuro change, for vicodin prn, prednisone asd trial, MRI ls spine asap, and refer NS,  to f/u any worsening symptoms or concerns

## 2021-10-01 NOTE — Assessment & Plan Note (Addendum)
Mod uncontrolled, out of meds - for singulari and xyzal restart, for predpac asd,  to f/u any worsening symptoms or concerns

## 2021-10-01 NOTE — Assessment & Plan Note (Signed)
Stable, to continue tums prn,  to f/u any worsening symptoms or concerns

## 2021-10-22 ENCOUNTER — Other Ambulatory Visit: Payer: 59

## 2021-10-25 ENCOUNTER — Telehealth: Payer: Self-pay

## 2021-10-25 NOTE — Telephone Encounter (Signed)
Check [place] registry last filled 09/28/2021. MRI is scheduled for 10/27/21.Marland Kitchenlmb

## 2021-10-25 NOTE — Telephone Encounter (Signed)
1.Medication Requested: Hydrocodone  2. Pharmacy (Name, [facility], [place]): [facility]  3. On Med List: yes no more refills though  4. Last Visit with PCP: 09/28/21    Agent: Please be advised that RX refills may take up to 3 business days. We ask that you follow-up with your pharmacy.

## 2021-10-26 MED ORDER — HYDROCODONE-ACETAMINOPHEN 7.5-325 MG PO TABS: 1.0000 | ORAL_TABLET | Freq: Four times a day (QID) | ORAL | 0 refills | Status: DC | PRN

## 2021-10-26 NOTE — Telephone Encounter (Signed)
Notified pt w/MD response.../lmb 

## 2021-10-26 NOTE — Telephone Encounter (Signed)
Renewed.  Follow-up with Dr. Jenny Reichmann or myself.  Thanks

## 2021-10-27 ENCOUNTER — Other Ambulatory Visit: Payer: Self-pay

## 2021-10-27 ENCOUNTER — Ambulatory Visit
Admission: RE | Admit: 2021-10-27 | Discharge: 2021-10-27 | Disposition: A | Discharge status: Home / Self Care | Payer: 59 | Source: Ambulatory Visit | Attending: Internal Medicine | Admitting: Internal Medicine

## 2021-10-27 DIAGNOSIS — M5416 Radiculopathy, lumbar region: Secondary | ICD-10-CM

## 2021-10-28 ENCOUNTER — Encounter: Payer: Self-pay | Admitting: Internal Medicine

## 2021-11-01 ENCOUNTER — Other Ambulatory Visit: Payer: Self-pay | Admitting: Neurosurgery

## 2021-11-14 ENCOUNTER — Other Ambulatory Visit (HOSPITAL_COMMUNITY): Payer: 59

## 2021-11-16 ENCOUNTER — Ambulatory Visit (HOSPITAL_COMMUNITY): Admission: RE | Admit: 2021-11-16 | Payer: 59 | Source: Home / Self Care

## 2021-11-16 ENCOUNTER — Encounter (HOSPITAL_COMMUNITY): Admission: RE | Payer: Self-pay | Source: Home / Self Care

## 2021-11-16 SURGERY — MINIMALLY INVASIVE (MIS) TRANSFORAMINAL LUMBAR INTERBODY FUSION (TLIF) 1 LEVEL
Anesthesia: General | Laterality: Left

## 2021-12-12 ENCOUNTER — Other Ambulatory Visit: Payer: Self-pay | Admitting: Neurosurgery

## 2021-12-21 NOTE — Progress Notes (Signed)
Surgical Instructions    Your procedure is scheduled on 12/26/21.  Report to [facility] Main Entrance "A" at 5:30 A.M., then check in with the Admitting office.  Call this number if you have problems the morning of surgery:  [phone]   If you have any questions prior to your surgery date call [phone]: Open Monday-Friday 8am-4pm    Remember:  Do not eat or drink after midnight the night before your surgery     Take these medicines the morning of surgery with A SIP OF WATER:  IF NEEDED: acetaminophen (TYLENOL)  fluticasone (FLONASE)  HYDROcodone-acetaminophen (NORCO) Polyethyl Glycol-Propyl Glycol (SYSTANE OP) predniSONE (DELTASONE)  As of today, STOP taking any Aspirin (unless otherwise instructed by your surgeon) Aleve, Naproxen, Ibuprofen, Motrin, Advil, Goody's, BC's, all herbal medications, fish oil, and all vitamins.  After your COVID test   You are not required to quarantine however you are required to wear a well-fitting mask when you are out and around people not in your household.  If your mask becomes wet or soiled, replace with a new one.  Wash your hands often with soap and water for 20 seconds or clean your hands with an alcohol-based hand sanitizer that contains at least 60% alcohol.  Do not share personal items.  Notify your provider: if you are in close contact with someone who has COVID  or if you develop a fever of 100.4 or greater, sneezing, cough, sore throat, shortness of breath or body aches.           Do not wear jewelry or makeup Do not wear lotions, powders, perfumes/colognes, or deodorant. Men may shave face and neck. Do not bring valuables to the hospital. Do not wear nail polish, gel polish, artificial nails, or any other type of covering on natural nails (fingers and toes) If you have artificial nails or gel coating that need to be removed by a nail salon, please have this removed prior to surgery. Artificial nails or gel coating may  interfere with anesthesia's ability to adequately monitor your vital signs.             [facility] is not responsible for any belongings or valuables.  Do NOT Smoke (Tobacco/Vaping)  24 hours prior to your procedure  If you use a CPAP at night, you may bring your mask for your overnight stay.   Contacts, glasses, hearing aids, dentures or partials may not be worn into surgery, please bring cases for these belongings   For patients admitted to the hospital, discharge time will be determined by your treatment team.   Patients discharged the day of surgery will not be allowed to drive home, and someone needs to stay with them for 24 hours.  NO VISITORS WILL BE ALLOWED IN PRE-OP WHERE PATIENTS ARE PREPPED FOR SURGERY.  ONLY 1 SUPPORT PERSON MAY BE PRESENT IN THE WAITING ROOM WHILE YOU ARE IN SURGERY.  IF YOU ARE TO BE ADMITTED, ONCE YOU ARE IN YOUR ROOM YOU WILL BE ALLOWED TWO (2) VISITORS. 1 (ONE) VISITOR MAY STAY OVERNIGHT BUT MUST ARRIVE TO THE ROOM BY 8pm.  Minor children may have two parents present. Special consideration for safety and communication needs will be reviewed on a case by case basis.  Special instructions:    Oral Hygiene is also important to reduce your risk of infection.  Remember - BRUSH YOUR TEETH THE MORNING OF SURGERY WITH YOUR REGULAR TOOTHPASTE   - Preparing For Surgery  Before surgery, you can  play an important role. Because skin is not sterile, your skin needs to be as free of germs as possible. You can reduce the number of germs on your skin by washing with CHG (chlorahexidine gluconate) Soap before surgery.  CHG is an antiseptic cleaner which kills germs and bonds with the skin to continue killing germs even after washing.     Please do not use if you have an allergy to CHG or antibacterial soaps. If your skin becomes reddened/irritated stop using the CHG.  Do not shave (including legs and underarms) for at least 48 hours prior to first CHG shower. It  is OK to shave your face.  Please follow these instructions carefully.     Shower the NIGHT BEFORE SURGERY and the MORNING OF SURGERY with CHG Soap.   If you chose to wash your hair, wash your hair first as usual with your normal shampoo. After you shampoo, rinse your hair and body thoroughly to remove the shampoo.  Then [employer] and genitals (private parts) with your normal soap and rinse thoroughly to remove soap.  After that Use CHG Soap as you would any other liquid soap. You can apply CHG directly to the skin and wash gently with a scrungie or a clean washcloth.   Apply the CHG Soap to your body ONLY FROM THE NECK DOWN.  Do not use on open wounds or open sores. Avoid contact with your eyes, ears, mouth and genitals (private parts). Wash Face and genitals (private parts)  with your normal soap.   Wash thoroughly, paying special attention to the area where your surgery will be performed.  Thoroughly rinse your body with warm water from the neck down.  DO NOT shower/wash with your normal soap after using and rinsing off the CHG Soap.  Pat yourself dry with a CLEAN TOWEL.  Wear CLEAN PAJAMAS to bed the night before surgery  Place CLEAN SHEETS on your bed the night before your surgery  DO NOT SLEEP WITH PETS.   Day of Surgery: Take a shower with CHG soap. Wear Clean/Comfortable clothing the morning of surgery Do not apply any deodorants/lotions.   Remember to brush your teeth WITH YOUR REGULAR TOOTHPASTE.   Please read over the following fact sheets that you were given.

## 2021-12-22 ENCOUNTER — Other Ambulatory Visit: Payer: Self-pay

## 2021-12-22 ENCOUNTER — Encounter (HOSPITAL_COMMUNITY): Payer: Self-pay

## 2021-12-22 ENCOUNTER — Encounter (HOSPITAL_COMMUNITY)
Admission: RE | Admit: 2021-12-22 | Discharge: 2021-12-22 | Disposition: A | Discharge status: Home / Self Care | Payer: 59 | Source: Ambulatory Visit | Attending: Neurosurgery | Admitting: Neurosurgery

## 2021-12-22 VITALS — BP 129/76 | HR 62 | Temp 98.0°F | Resp 17 | Ht 69.0 in | Wt 227.6 lb

## 2021-12-22 DIAGNOSIS — M545 Low back pain, unspecified: Secondary | ICD-10-CM | POA: Diagnosis not present

## 2021-12-22 DIAGNOSIS — Z20822 Contact with and (suspected) exposure to covid-19: Secondary | ICD-10-CM | POA: Insufficient documentation

## 2021-12-22 DIAGNOSIS — G8929 Other chronic pain: Secondary | ICD-10-CM | POA: Diagnosis not present

## 2021-12-22 DIAGNOSIS — Z01818 Encounter for other preprocedural examination: Secondary | ICD-10-CM

## 2021-12-22 DIAGNOSIS — Z01812 Encounter for preprocedural laboratory examination: Secondary | ICD-10-CM | POA: Insufficient documentation

## 2021-12-22 LAB — SURGICAL PCR SCREEN
MRSA, PCR: NEGATIVE
Staphylococcus aureus: NEGATIVE

## 2021-12-22 LAB — CBC
HCT: 46.4 % (ref 39.0–52.0)
Hemoglobin: 14.4 g/dL (ref 13.0–17.0)
MCH: 25 pg — ABNORMAL LOW (ref 26.0–34.0)
MCHC: 31 g/dL (ref 30.0–36.0)
MCV: 80.4 fL (ref 80.0–100.0)
Platelets: 220 10*3/uL (ref 150–400)
RBC: 5.77 MIL/uL (ref 4.22–5.81)
RDW: 13.7 % (ref 11.5–15.5)
WBC: 10.6 10*3/uL — ABNORMAL HIGH (ref 4.0–10.5)
nRBC: 0 % (ref 0.0–0.2)

## 2021-12-22 LAB — TYPE AND SCREEN
ABO/RH(D): O POS
Antibody Screen: NEGATIVE

## 2021-12-22 LAB — SARS CORONAVIRUS 2 (TAT 6-24 HRS): SARS Coronavirus 2: NEGATIVE

## 2021-12-22 NOTE — Progress Notes (Signed)
PCP - Dr. Alain Marion Cardiologist - Denies  Chest x-ray - Not indicated EKG - Not indicated Stress Test - Denies ECHO - Denies Cardiac Cath - Denies  Sleep Study - Denies  DM - Denies  COVID TEST- 12/22/21  Anesthesia review: No  Patient denies shortness of breath, fever, cough and chest pain at PAT appointment   All instructions explained to the patient, with a verbal understanding of the material. Patient agrees to go over the instructions while at home for a better understanding. Patient also instructed to wear a mask while in public after being tested for COVID-19. The opportunity to ask questions was provided.

## 2021-12-26 ENCOUNTER — Ambulatory Visit (HOSPITAL_COMMUNITY): Payer: 59

## 2021-12-26 ENCOUNTER — Encounter (HOSPITAL_COMMUNITY)
Admission: RE | Disposition: A | Discharge status: Home / Self Care | Payer: Self-pay | Source: Home / Self Care | Attending: Neurosurgery

## 2021-12-26 ENCOUNTER — Ambulatory Visit (HOSPITAL_COMMUNITY): Payer: 59 | Admitting: Anesthesiology

## 2021-12-26 ENCOUNTER — Encounter (HOSPITAL_COMMUNITY): Payer: Self-pay

## 2021-12-26 ENCOUNTER — Other Ambulatory Visit: Payer: Self-pay

## 2021-12-26 ENCOUNTER — Observation Stay (HOSPITAL_COMMUNITY)
Admission: RE | Admit: 2021-12-26 | Discharge: 2021-12-27 | Disposition: A | Discharge status: Home / Self Care | Payer: 59 | Attending: Neurosurgery | Admitting: Neurosurgery

## 2021-12-26 DIAGNOSIS — Z8616 Personal history of COVID-19: Secondary | ICD-10-CM | POA: Insufficient documentation

## 2021-12-26 DIAGNOSIS — Z981 Arthrodesis status: Secondary | ICD-10-CM | POA: Diagnosis not present

## 2021-12-26 DIAGNOSIS — Z87891 Personal history of nicotine dependence: Secondary | ICD-10-CM | POA: Insufficient documentation

## 2021-12-26 DIAGNOSIS — M4326 Fusion of spine, lumbar region: Secondary | ICD-10-CM | POA: Diagnosis not present

## 2021-12-26 DIAGNOSIS — M5126 Other intervertebral disc displacement, lumbar region: Secondary | ICD-10-CM | POA: Insufficient documentation

## 2021-12-26 DIAGNOSIS — M5116 Intervertebral disc disorders with radiculopathy, lumbar region: Secondary | ICD-10-CM | POA: Diagnosis not present

## 2021-12-26 DIAGNOSIS — Z419 Encounter for procedure for purposes other than remedying health state, unspecified: Secondary | ICD-10-CM

## 2021-12-26 DIAGNOSIS — M5416 Radiculopathy, lumbar region: Principal | ICD-10-CM | POA: Insufficient documentation

## 2021-12-26 HISTORY — PX: TRANSFORAMINAL LUMBAR INTERBODY FUSION W/ MIS 1 LEVEL: SHX6145

## 2021-12-26 LAB — ABO/RH: ABO/RH(D): O POS

## 2021-12-26 SURGERY — MINIMALLY INVASIVE (MIS) TRANSFORAMINAL LUMBAR INTERBODY FUSION (TLIF) 1 LEVEL
Anesthesia: General | Laterality: Left

## 2021-12-26 MED ORDER — FENTANYL CITRATE (PF) 250 MCG/5ML IJ SOLN
INTRAMUSCULAR | Status: AC
  Filled 2021-12-26: qty 5

## 2021-12-26 MED ORDER — ACETAMINOPHEN 10 MG/ML IV SOLN
INTRAVENOUS | Status: DC | PRN
  Administered 2021-12-26: 1000 mg via INTRAVENOUS

## 2021-12-26 MED ORDER — SODIUM CHLORIDE 0.9% FLUSH: 3.0000 mL | INTRAVENOUS | Status: DC | PRN

## 2021-12-26 MED ORDER — OXYCODONE HCL 5 MG/5ML PO SOLN: 5.0000 mg | Freq: Once | ORAL | Status: DC | PRN

## 2021-12-26 MED ORDER — BUPIVACAINE LIPOSOME 1.3 % IJ SUSP
INTRAMUSCULAR | Status: AC
  Filled 2021-12-26: qty 20

## 2021-12-26 MED ORDER — POLYETHYL GLYCOL-PROPYL GLYCOL 0.4-0.3 % OP GEL: Freq: Every day | OPHTHALMIC | Status: DC | PRN

## 2021-12-26 MED ORDER — MIDAZOLAM HCL 2 MG/2ML IJ SOLN
INTRAMUSCULAR | Status: AC
  Filled 2021-12-26: qty 2

## 2021-12-26 MED ORDER — HYDROMORPHONE HCL 1 MG/ML IJ SOLN: 0.5000 mg | INTRAMUSCULAR | Status: DC | PRN

## 2021-12-26 MED ORDER — LORATADINE 10 MG PO TABS
5.0000 mg | ORAL_TABLET | Freq: Every evening | ORAL | Status: DC
  Administered 2021-12-26: 5 mg via ORAL
  Filled 2021-12-26: qty 1

## 2021-12-26 MED ORDER — FENTANYL CITRATE (PF) 250 MCG/5ML IJ SOLN
INTRAMUSCULAR | Status: DC | PRN
  Administered 2021-12-26: 50 ug via INTRAVENOUS
  Administered 2021-12-26: 150 ug via INTRAVENOUS
  Administered 2021-12-26: 50 ug via INTRAVENOUS

## 2021-12-26 MED ORDER — OXYCODONE HCL 5 MG PO TABS
5.0000 mg | ORAL_TABLET | ORAL | Status: DC | PRN
  Administered 2021-12-26 – 2021-12-27 (×2): 5 mg via ORAL
  Filled 2021-12-26 (×2): qty 1

## 2021-12-26 MED ORDER — FENTANYL CITRATE (PF) 100 MCG/2ML IJ SOLN
INTRAMUSCULAR | Status: AC
  Filled 2021-12-26: qty 2

## 2021-12-26 MED ORDER — PHENYLEPHRINE 40 MCG/ML (10ML) SYRINGE FOR IV PUSH (FOR BLOOD PRESSURE SUPPORT)
PREFILLED_SYRINGE | INTRAVENOUS | Status: DC | PRN
  Administered 2021-12-26: 80 ug via INTRAVENOUS
  Administered 2021-12-26: 160 ug via INTRAVENOUS
  Administered 2021-12-26: 80 ug via INTRAVENOUS

## 2021-12-26 MED ORDER — ACETAMINOPHEN 10 MG/ML IV SOLN: 1000.0000 mg | Freq: Once | INTRAVENOUS | Status: DC | PRN

## 2021-12-26 MED ORDER — POLYETHYLENE GLYCOL 3350 17 G PO PACK: 17.0000 g | PACK | Freq: Every day | ORAL | Status: DC | PRN

## 2021-12-26 MED ORDER — ONDANSETRON HCL 4 MG PO TABS: 4.0000 mg | ORAL_TABLET | Freq: Four times a day (QID) | ORAL | Status: DC | PRN

## 2021-12-26 MED ORDER — ROCURONIUM BROMIDE 10 MG/ML (PF) SYRINGE
PREFILLED_SYRINGE | INTRAVENOUS | Status: DC | PRN
  Administered 2021-12-26 (×3): 20 mg via INTRAVENOUS
  Administered 2021-12-26: 90 mg via INTRAVENOUS

## 2021-12-26 MED ORDER — SODIUM CHLORIDE 0.9% FLUSH
3.0000 mL | Freq: Two times a day (BID) | INTRAVENOUS | Status: DC
  Administered 2021-12-26 (×2): 3 mL via INTRAVENOUS

## 2021-12-26 MED ORDER — ACETAMINOPHEN 10 MG/ML IV SOLN
INTRAVENOUS | Status: AC
  Filled 2021-12-26: qty 100

## 2021-12-26 MED ORDER — MENTHOL 3 MG MT LOZG: 1.0000 | LOZENGE | OROMUCOSAL | Status: DC | PRN

## 2021-12-26 MED ORDER — CHLORHEXIDINE GLUCONATE 0.12 % MT SOLN: 15.0000 mL | Freq: Once | OROMUCOSAL | Status: AC

## 2021-12-26 MED ORDER — BUPIVACAINE HCL (PF) 0.5 % IJ SOLN
INTRAMUSCULAR | Status: DC | PRN
  Administered 2021-12-26: 30 mL

## 2021-12-26 MED ORDER — CHLORHEXIDINE GLUCONATE CLOTH 2 % EX PADS: 6.0000 | MEDICATED_PAD | Freq: Once | CUTANEOUS | Status: DC

## 2021-12-26 MED ORDER — OXYCODONE HCL 5 MG PO TABS: 5.0000 mg | ORAL_TABLET | Freq: Once | ORAL | Status: DC | PRN

## 2021-12-26 MED ORDER — LIDOCAINE-EPINEPHRINE 1 %-1:100000 IJ SOLN
INTRAMUSCULAR | Status: DC | PRN
  Administered 2021-12-26: 19 mL

## 2021-12-26 MED ORDER — ACETAMINOPHEN 325 MG PO TABS
650.0000 mg | ORAL_TABLET | ORAL | Status: DC | PRN
  Administered 2021-12-26 – 2021-12-27 (×2): 650 mg via ORAL
  Filled 2021-12-26 (×2): qty 2

## 2021-12-26 MED ORDER — ORAL CARE MOUTH RINSE: 15.0000 mL | Freq: Once | OROMUCOSAL | Status: AC

## 2021-12-26 MED ORDER — THROMBIN 5000 UNITS EX SOLR
CUTANEOUS | Status: AC
  Filled 2021-12-26: qty 5000

## 2021-12-26 MED ORDER — ACETAMINOPHEN 160 MG/5ML PO SOLN: 1000.0000 mg | Freq: Once | ORAL | Status: DC | PRN

## 2021-12-26 MED ORDER — 0.9 % SODIUM CHLORIDE (POUR BTL) OPTIME
TOPICAL | Status: DC | PRN
  Administered 2021-12-26: 1000 mL

## 2021-12-26 MED ORDER — PROPOFOL 10 MG/ML IV BOLUS
INTRAVENOUS | Status: DC | PRN
  Administered 2021-12-26: 160 mg via INTRAVENOUS

## 2021-12-26 MED ORDER — CEFAZOLIN SODIUM-DEXTROSE 2-4 GM/100ML-% IV SOLN
INTRAVENOUS | Status: AC
  Filled 2021-12-26: qty 100

## 2021-12-26 MED ORDER — METHOCARBAMOL 500 MG PO TABS
500.0000 mg | ORAL_TABLET | Freq: Four times a day (QID) | ORAL | Status: DC | PRN
  Administered 2021-12-26 – 2021-12-27 (×3): 500 mg via ORAL
  Filled 2021-12-26 (×3): qty 1

## 2021-12-26 MED ORDER — SUGAMMADEX SODIUM 200 MG/2ML IV SOLN
INTRAVENOUS | Status: DC | PRN
Start: 2021-12-26 — End: 2021-12-26
  Administered 2021-12-26: 300 mg via INTRAVENOUS

## 2021-12-26 MED ORDER — ACETAMINOPHEN 500 MG PO TABS: 1000.0000 mg | ORAL_TABLET | Freq: Once | ORAL | Status: DC | PRN

## 2021-12-26 MED ORDER — MIDAZOLAM HCL 2 MG/2ML IJ SOLN
INTRAMUSCULAR | Status: DC | PRN
  Administered 2021-12-26: 2 mg via INTRAVENOUS

## 2021-12-26 MED ORDER — KETOROLAC TROMETHAMINE 30 MG/ML IJ SOLN
INTRAMUSCULAR | Status: DC | PRN
  Administered 2021-12-26: 30 mg via INTRAVENOUS

## 2021-12-26 MED ORDER — PHENOL 1.4 % MT LIQD: 1.0000 | OROMUCOSAL | Status: DC | PRN

## 2021-12-26 MED ORDER — ONDANSETRON HCL 4 MG/2ML IJ SOLN
4.0000 mg | Freq: Four times a day (QID) | INTRAMUSCULAR | Status: DC | PRN
  Administered 2021-12-26: 4 mg via INTRAVENOUS
  Filled 2021-12-26: qty 2

## 2021-12-26 MED ORDER — PROPOFOL 10 MG/ML IV BOLUS
INTRAVENOUS | Status: AC
  Filled 2021-12-26: qty 20

## 2021-12-26 MED ORDER — CEFAZOLIN SODIUM-DEXTROSE 1-4 GM/50ML-% IV SOLN
1.0000 g | Freq: Three times a day (TID) | INTRAVENOUS | Status: AC
  Administered 2021-12-26 – 2021-12-27 (×3): 1 g via INTRAVENOUS
  Filled 2021-12-26 (×2): qty 50

## 2021-12-26 MED ORDER — DEXAMETHASONE SODIUM PHOSPHATE 10 MG/ML IJ SOLN
INTRAMUSCULAR | Status: DC | PRN
  Administered 2021-12-26: 10 mg via INTRAVENOUS

## 2021-12-26 MED ORDER — SODIUM CHLORIDE 0.9 % IV SOLN
250.0000 mL | INTRAVENOUS | Status: DC
  Administered 2021-12-26: 250 mL via INTRAVENOUS

## 2021-12-26 MED ORDER — ACETAMINOPHEN 650 MG RE SUPP: 650.0000 mg | RECTAL | Status: DC | PRN

## 2021-12-26 MED ORDER — CHLORHEXIDINE GLUCONATE 0.12 % MT SOLN
OROMUCOSAL | Status: AC
  Administered 2021-12-26: 15 mL via OROMUCOSAL
  Filled 2021-12-26: qty 15

## 2021-12-26 MED ORDER — LIDOCAINE-EPINEPHRINE 1 %-1:100000 IJ SOLN
INTRAMUSCULAR | Status: AC
  Filled 2021-12-26: qty 1

## 2021-12-26 MED ORDER — FENTANYL CITRATE (PF) 100 MCG/2ML IJ SOLN
25.0000 ug | INTRAMUSCULAR | Status: DC | PRN
  Administered 2021-12-26: 25 ug via INTRAVENOUS
  Administered 2021-12-26: 50 ug via INTRAVENOUS
  Administered 2021-12-26: 25 ug via INTRAVENOUS

## 2021-12-26 MED ORDER — DEXTROSE 5 % IV SOLN
500.0000 mg | Freq: Four times a day (QID) | INTRAVENOUS | Status: DC | PRN
  Filled 2021-12-26: qty 5

## 2021-12-26 MED ORDER — FLEET ENEMA 7-19 GM/118ML RE ENEM: 1.0000 | ENEMA | Freq: Once | RECTAL | Status: DC | PRN

## 2021-12-26 MED ORDER — OXYCODONE HCL 5 MG PO TABS
10.0000 mg | ORAL_TABLET | ORAL | Status: DC | PRN
  Administered 2021-12-26 – 2021-12-27 (×3): 10 mg via ORAL
  Filled 2021-12-26 (×3): qty 2

## 2021-12-26 MED ORDER — BUPIVACAINE HCL (PF) 0.5 % IJ SOLN
INTRAMUSCULAR | Status: AC
  Filled 2021-12-26: qty 30

## 2021-12-26 MED ORDER — SODIUM CHLORIDE (PF) 0.9 % IJ SOLN
INTRAMUSCULAR | Status: DC | PRN
  Administered 2021-12-26: 10 mL

## 2021-12-26 MED ORDER — THROMBIN 5000 UNITS EX SOLR
OROMUCOSAL | Status: DC | PRN
  Administered 2021-12-26: 5 mL via TOPICAL

## 2021-12-26 MED ORDER — CEFAZOLIN SODIUM-DEXTROSE 2-4 GM/100ML-% IV SOLN
2.0000 g | INTRAVENOUS | Status: AC
  Administered 2021-12-26: 2 g via INTRAVENOUS

## 2021-12-26 MED ORDER — EPHEDRINE SULFATE-NACL 50-0.9 MG/10ML-% IV SOSY
PREFILLED_SYRINGE | INTRAVENOUS | Status: DC | PRN
  Administered 2021-12-26: 5 mg via INTRAVENOUS

## 2021-12-26 MED ORDER — LACTATED RINGERS IV SOLN: INTRAVENOUS | Status: DC

## 2021-12-26 MED ORDER — BUPIVACAINE LIPOSOME 1.3 % IJ SUSP
INTRAMUSCULAR | Status: DC | PRN
Start: 2021-12-26 — End: 2021-12-26
  Administered 2021-12-26: 20 mL

## 2021-12-26 MED ORDER — POTASSIUM CHLORIDE IN NACL 20-0.9 MEQ/L-% IV SOLN: INTRAVENOUS | Status: DC

## 2021-12-26 MED ORDER — DOCUSATE SODIUM 100 MG PO CAPS
100.0000 mg | ORAL_CAPSULE | Freq: Two times a day (BID) | ORAL | Status: DC
  Administered 2021-12-26 – 2021-12-27 (×3): 100 mg via ORAL
  Filled 2021-12-26 (×3): qty 1

## 2021-12-26 MED ORDER — ONDANSETRON HCL 4 MG/2ML IJ SOLN
INTRAMUSCULAR | Status: DC | PRN
Start: 2021-12-26 — End: 2021-12-26
  Administered 2021-12-26: 4 mg via INTRAVENOUS

## 2021-12-26 MED ORDER — PHENYLEPHRINE HCL-NACL 20-0.9 MG/250ML-% IV SOLN
INTRAVENOUS | Status: DC | PRN
  Administered 2021-12-26: 30 ug/min via INTRAVENOUS

## 2021-12-26 SURGICAL SUPPLY — 82 items
ADH SKN CLS APL DERMABOND .7 (GAUZE/BANDAGES/DRESSINGS) ×2
ADH SKN CLS LQ APL DERMABOND (GAUZE/BANDAGES/DRESSINGS) ×2
BAG COUNTER SPONGE SURGICOUNT (BAG) ×3 IMPLANT
BAG SPNG CNTER NS LX DISP (BAG) ×2
BAND INSRT 18 STRL LF DISP RB (MISCELLANEOUS)
BAND RUBBER #18 3X1/16 STRL (MISCELLANEOUS) IMPLANT
BASKET BONE COLLECTION (BASKET) IMPLANT
BLADE CLIPPER SURG (BLADE) IMPLANT
BLADE SURG 11 STRL SS (BLADE) ×2 IMPLANT
BUR MATCHSTICK NEURO 3.0 LAGG (BURR) ×2 IMPLANT
BUR PRECISION FLUTE 5.0 (BURR) ×2 IMPLANT
CANISTER SUCT 3000ML PPV (MISCELLANEOUS) ×2 IMPLANT
CNTNR URN SCR LID CUP LEK RST (MISCELLANEOUS) ×1 IMPLANT
CONT SPEC 4OZ STRL OR WHT (MISCELLANEOUS) ×2
COVER BACK TABLE 60X90IN (DRAPES) ×2 IMPLANT
DECANTER SPIKE VIAL GLASS SM (MISCELLANEOUS) ×2 IMPLANT
DERMABOND ADHESIVE PROPEN (GAUZE/BANDAGES/DRESSINGS) ×2
DERMABOND ADVANCED (GAUZE/BANDAGES/DRESSINGS) ×2
DERMABOND ADVANCED .7 DNX12 (GAUZE/BANDAGES/DRESSINGS) ×2 IMPLANT
DERMABOND ADVANCED .7 DNX6 (GAUZE/BANDAGES/DRESSINGS) IMPLANT
DRAIN JACKSON PRATT 10MM FLAT (MISCELLANEOUS) IMPLANT
DRAPE 3/4 80X56 (DRAPES) ×2 IMPLANT
DRAPE C-ARM 42X72 X-RAY (DRAPES) ×2 IMPLANT
DRAPE C-ARMOR (DRAPES) ×2 IMPLANT
DRAPE LAPAROTOMY 100X72X124 (DRAPES) ×2 IMPLANT
DRAPE MICROSCOPE LEICA (MISCELLANEOUS) ×2 IMPLANT
DRSG OPSITE POSTOP 4X6 (GAUZE/BANDAGES/DRESSINGS) ×2 IMPLANT
DURAPREP 26ML APPLICATOR (WOUND CARE) ×2 IMPLANT
ELECT BLADE INSULATED 6.5IN (ELECTROSURGICAL) ×2
ELECT COATED BLADE 2.86 ST (ELECTRODE) ×2 IMPLANT
ELECT REM PT RETURN 9FT ADLT (ELECTROSURGICAL) ×2
ELECTRODE BLDE INSULATED 6.5IN (ELECTROSURGICAL) ×1 IMPLANT
ELECTRODE REM PT RTRN 9FT ADLT (ELECTROSURGICAL) ×1 IMPLANT
EVACUATOR SILICONE 100CC (DRAIN) IMPLANT
EXTENDER TAB GUIDE SV 5.5/6.0 (INSTRUMENTS) ×8 IMPLANT
GAUZE 4X4 16PLY ~~LOC~~+RFID DBL (SPONGE) ×2 IMPLANT
GAUZE SPONGE 4X4 12PLY STRL (GAUZE/BANDAGES/DRESSINGS) IMPLANT
GLOVE EXAM NITRILE XL STR (GLOVE) IMPLANT
GLOVE SURG LTX SZ7.5 (GLOVE) ×2 IMPLANT
GLOVE SURG UNDER POLY LF SZ7.5 (GLOVE) ×2 IMPLANT
GOWN STRL REUS W/ TWL LRG LVL3 (GOWN DISPOSABLE) ×1 IMPLANT
GOWN STRL REUS W/ TWL XL LVL3 (GOWN DISPOSABLE) ×1 IMPLANT
GOWN STRL REUS W/TWL 2XL LVL3 (GOWN DISPOSABLE) IMPLANT
GOWN STRL REUS W/TWL LRG LVL3 (GOWN DISPOSABLE) ×2
GOWN STRL REUS W/TWL XL LVL3 (GOWN DISPOSABLE) ×2
GUIDEWIRE BLUNT NT 450 (WIRE) ×4 IMPLANT
HEMOSTAT POWDER KIT SURGIFOAM (HEMOSTASIS) ×2 IMPLANT
KIT BASIN OR (CUSTOM PROCEDURE TRAY) ×2 IMPLANT
KIT INFUSE XX SMALL 0.7CC (Orthopedic Implant) ×1 IMPLANT
KIT TURNOVER KIT B (KITS) ×2 IMPLANT
KNIFE SURG 188MM BAYONET LONG (INSTRUMENTS) ×1 IMPLANT
MILL MEDIUM DISP (BLADE) ×2 IMPLANT
NDL ASPIRATION RAN815N 8X15 (NEEDLE) IMPLANT
NDL HYPO 18GX1.5 BLUNT FILL (NEEDLE) IMPLANT
NDL HYPO 21X1.5 SAFETY (NEEDLE) IMPLANT
NDL SPNL 18GX3.5 QUINCKE PK (NEEDLE) IMPLANT
NEEDLE ASPIRATION RAN815N 8X15 (NEEDLE) ×2 IMPLANT
NEEDLE ASPIRATION RANFAC 8X15 (NEEDLE) ×2
NEEDLE HYPO 18GX1.5 BLUNT FILL (NEEDLE) IMPLANT
NEEDLE HYPO 21X1.5 SAFETY (NEEDLE) ×2 IMPLANT
NEEDLE HYPO 22GX1.5 SAFETY (NEEDLE) ×2 IMPLANT
NEEDLE SPNL 18GX3.5 QUINCKE PK (NEEDLE) IMPLANT
NS IRRIG 1000ML POUR BTL (IV SOLUTION) ×2 IMPLANT
PACK LAMINECTOMY NEURO (CUSTOM PROCEDURE TRAY) ×2 IMPLANT
PAD ARMBOARD 7.5X6 YLW CONV (MISCELLANEOUS) ×6 IMPLANT
PUTTY GRAFTON DBF 6CC W/DELIVE (Putty) ×1 IMPLANT
ROD 5.5X45MM SOLERA VOYAGER (Rod) ×2 IMPLANT
SCREW MAS VOYAGER 7.5X45 (Screw) ×4 IMPLANT
SCREW SET 5.5/6.0MM SOLERA (Screw) ×4 IMPLANT
SPACER TLIF CATALYFT 9X40 (Spacer) ×1 IMPLANT
SPONGE SURGIFOAM ABS GEL 100 (HEMOSTASIS) IMPLANT
SPONGE T-LAP 4X18 ~~LOC~~+RFID (SPONGE) ×1 IMPLANT
SUT MNCRL AB 4-0 PS2 18 (SUTURE) ×3 IMPLANT
SUT VIC AB 0 CT1 18XCR BRD8 (SUTURE) ×1 IMPLANT
SUT VIC AB 0 CT1 8-18 (SUTURE) ×2
SUT VIC AB 2-0 CP2 18 (SUTURE) ×3 IMPLANT
SUT VIC AB 3-0 SH 8-18 (SUTURE) ×1 IMPLANT
SYR 30ML LL (SYRINGE) ×2 IMPLANT
TOWEL GREEN STERILE (TOWEL DISPOSABLE) ×2 IMPLANT
TOWEL GREEN STERILE FF (TOWEL DISPOSABLE) ×2 IMPLANT
TRAY FOLEY MTR SLVR 16FR STAT (SET/KITS/TRAYS/PACK) ×2 IMPLANT
WATER STERILE IRR 1000ML POUR (IV SOLUTION) ×2 IMPLANT

## 2021-12-26 NOTE — Anesthesia Procedure Notes (Signed)
Procedure Name: Intubation Date/Time: 12/26/2021 8:19 AM Performed by: Oleta Mouse, MD Pre-anesthesia Checklist: Patient identified, Emergency Drugs available, Suction available and Patient being monitored Patient Re-evaluated:Patient Re-evaluated prior to induction Oxygen Delivery Method: Circle System Utilized Preoxygenation: Pre-oxygenation with 100% oxygen Induction Type: IV induction Ventilation: Mask ventilation without difficulty Laryngoscope Size: Mac and 3 Grade View: Grade II Tube type: Oral Tube size: 7.5 mm Number of attempts: 1 Airway Equipment and Method: Stylet Placement Confirmation: ETT inserted through vocal cords under direct vision, positive ETCO2 and breath sounds checked- equal and bilateral Secured at: 23 cm Tube secured with: Tape Dental Injury: Teeth and Oropharynx as per pre-operative assessment

## 2021-12-26 NOTE — Anesthesia Postprocedure Evaluation (Signed)
Anesthesia Post Note  Patient: Joe Hawkins  Procedure(s) Performed: Minimally Invasive Transforaminal Lumbar Interbody Fusion, Left Lumbar Four-Five45 (Left)     Patient location during evaluation: PACU Anesthesia Type: General Level of consciousness: awake and alert Pain management: pain level controlled Vital Signs Assessment: post-procedure vital signs reviewed and stable Respiratory status: spontaneous breathing, nonlabored ventilation, respiratory function stable and patient connected to nasal cannula oxygen Cardiovascular status: blood pressure returned to baseline and stable Postop Assessment: no apparent nausea or vomiting Anesthetic complications: no   No notable events documented.  Last Vitals:  Vitals:   12/26/21 1300 12/26/21 1326  BP: 119/76 130/72  Pulse: 81 77  Resp: 15 20  Temp: 36.8 C 36.6 C  SpO2: 94% 96%    Last Pain:  Vitals:   12/26/21 1235  TempSrc:   PainSc: 6                  Rael Tilly

## 2021-12-26 NOTE — Progress Notes (Signed)
Orthopedic Tech Progress Note Patient Details:  Joe Hawkins May 04, 1961 [id number]  Ortho Devices Type of Ortho Device: Lumbar corsett Ortho Device/Splint Location: BACK Ortho Device/Splint Interventions: Ordered   Post Interventions Patient Tolerated: Well Instructions Provided: Care of Lee 12/26/2021, 2:33 PM

## 2021-12-26 NOTE — Transfer of Care (Signed)
Immediate Anesthesia Transfer of Care Note  Patient: Joe Hawkins  Procedure(s) Performed: Minimally Invasive Transforaminal Lumbar Interbody Fusion, Left Lumbar Four-Five45 (Left)  Patient Location: PACU  Anesthesia Type:General  Level of Consciousness: drowsy and patient cooperative  Airway & Oxygen Therapy: Patient Spontanous Breathing  Post-op Assessment: Report given to RN and Post -op Vital signs reviewed and stable  Post vital signs: Reviewed and stable  Last Vitals:  Vitals Value Taken Time  BP 120/69 12/26/21 1144  Temp    Pulse 81 12/26/21 1145  Resp 12 12/26/21 1145  SpO2 94 % 12/26/21 1145  Vitals shown include unvalidated device data.  Last Pain:  Vitals:   12/26/21 0616  TempSrc: Oral  PainSc:          Complications: No notable events documented.

## 2021-12-26 NOTE — Anesthesia Preprocedure Evaluation (Signed)
Anesthesia Evaluation  Patient identified by MRN, date of birth, ID band Patient awake    Reviewed: Allergy & Precautions, NPO status , Patient's Chart, lab work & pertinent test results  History of Anesthesia Complications Negative for: history of anesthetic complications  Airway Mallampati: IV  TM Distance: <3 FB Neck ROM: Full    Dental  (+) Dental Advisory Given, Teeth Intact   Pulmonary neg shortness of breath, neg sleep apnea, neg COPD, neg recent URI, former smoker,    breath sounds clear to auscultation       Cardiovascular negative cardio ROS   Rhythm:Regular     Neuro/Psych  Headaches,  Neuromuscular disease negative psych ROS   GI/Hepatic Neg liver ROS, GERD  Controlled,  Endo/Other  negative endocrine ROS  Renal/GU negative Renal ROS     Musculoskeletal negative musculoskeletal ROS (+)   Abdominal   Peds  Hematology negative hematology ROS (+)   Anesthesia Other Findings   Reproductive/Obstetrics                             Anesthesia Physical Anesthesia Plan  ASA: 1  Anesthesia Plan: General   Post-op Pain Management: Tylenol PO (pre-op) and Toradol IV (intra-op)   Induction: Intravenous  PONV Risk Score and Plan: 2 and Ondansetron and Dexamethasone  Airway Management Planned: Oral ETT  Additional Equipment: None  Intra-op Plan:   Post-operative Plan: Extubation in OR  Informed Consent: I have reviewed the patients History and Physical, chart, labs and discussed the procedure including the risks, benefits and alternatives for the proposed anesthesia with the patient or authorized representative who has indicated his/her understanding and acceptance.     Dental advisory given  Plan Discussed with: CRNA and Anesthesiologist  Anesthesia Plan Comments:         Anesthesia Quick Evaluation

## 2021-12-26 NOTE — Op Note (Addendum)
PREOP DIAGNOSIS: Recurrent disc herniation with radiculopathy  POSTOP DIAGNOSIS: Recurrent disc herniation with radiculopathy  PROCEDURE: 1. Left L4-5 lumbar interbody fusion via transforaminal approach with tubular retractors 2. Posterior arthrodesis, L4-5 2. Placement of interbody cage L4-5 3. Nonsegmental instrumentation with percutaneously placed pedicle screw and rod construct at L4-5 4. Harvest of local autograft 5. Use of morselized allograft 6. Use of microscope for microdissection  SURGEON: Dr. Duffy Rhody, MD  ASSISTANT:  Elwin Sleight, DO. Please note, there were no qualified trainees available to assist with the procedure.  An assistant was required for aid in retraction of the neural elements. Weston Brass, NP   ANESTHESIA: General Endotracheal  EBL: 50 ml  IMPLANTS:  [employer] Catalyft 9 mm Long PL40 expandable implant 7.5 x 45 mm screws at L4 and L5 bilaterally 45 mm rods Xxs BMP, DBF  SPECIMENS: None  DRAINS: None  COMPLICATIONS: none  CONDITION: Stable to PACU  HISTORY: Joe Hawkins is a 61 y.o. male who had a history of previous lumbar surgery at left L4-5 for disc herniation.  He developed recurrent radiculopathy with dorsiflexion weakness, and was found to have recurrent disc herniation with L5 nerve impingement, disc space collapse, mild listhesis.  He had a significant component of mechanical back pain as well. Treatment options were discussed and the patient elected to proceed with MIS TLIF at L4-5. Risks, benefits, alternatives, and expected convalescence were discussed with the patient.  Risks discussed included but were not limited to bleeding, pain, infection, scar, pseudoarthrosis, CSF leak, neurologic deficit, paralysis, and death.  The patient wished to proceed with surgery and informed consent was obtained.  PROCEDURE IN DETAIL: After informed consent was obtained and witnessed, the patient was brought to the operating room. After induction of  general anesthesia, the patient was positioned on the operative table in the prone position on a Jackson table with all pressure points meticulously padded. The skin of the low back was then prepped and draped in the usual sterile fashion.  Under fluoroscopy, the pedicles of the correct levels were identified and marked out on the skin, and after timeout was conducted, the skin was infiltrated with local anesthetic.  A 10 blade was used to incise the skin and the fascia overlying the pedicles on AP fluoroscopy.  Jamshidi's were then placed at the lateral aspect of the pedicle is identified AP fluoroscopy and malleted into the body under x-ray guidance.  C-arm was then switched to lateral which confirmed entry into the body.  K wires were then passed through the Jamshidi's and the sheaths were removed.  The K wires were bent out of the way for the subsequent portion of the surgery.  The tubular retractor system was then docked on the left L4-5 facet joint under fluoroscopic guidance.  Sequential dilators and final tubular retractor was placed and locked in position under fluoroscopic guidance.  The microscope was then introduced in the field to allow for intraoperative microdissection.  Remaining muscle was moved off of the degenerated facet joint.  The inferior facet was dissected from the laminotomy scar and removed using an osteotome and harvested for autograft.  Overgrown superior facets was then removed with rongeurs and high speed drill .  The traversing nerve root was identified and good decompression was performed with removal of overlying scar and remaining facets with rongeurs.  The exiting nerve root in the foramen was also seen.  Epidural veins in the foramen were coagulated and cut with allowed access to the disc space lateral  to the traversing nerve root.  The traversing nerve root was gently retracted medially to allow greater access to the disc herniation and bulge.  The disc was opened with 15  blade and pituitary rongeur was used to remove disc material.  Disc shavers of increasing size was then used to clean the disc space as well as to restore disc space height.  The interbody space was prepared with rasps and curettes with removal of the cartilage from the endplates.  Appropriate sized interbody spacer was then sized.  The inner body space was packed with morselized allograft and xxs BMP mixed with autograft.  An expandable size 9 mm interbody spacer was then tamped into place with a mallet under x-ray guidance.  He was then expanded until snug with the endplates.  AP and lateral x-ray confirmed good position of the implant.  The wound was then irrigated thoroughly with bacitracin impregnated irrigation and meticulous hemostasis was obtained.  The retractor was then removed and meticulous hemostasis was obtained in the muscle layer and subcutaneous layer.  Sharp curette was used to decorticate the right L4 lamina and pars and the right L4-5 facet.  The pedicles were tapped over the K wires with decortication of the L4-5 facets. Size 7.5 x 45 mm pedicle screws were placed at each level under fluoroscopic guidance.  Good purchase was noted.  45 mm rods were then passed through the screw towers bilaterally and secured with screw caps.  X-ray confirmed good placement.  The screw caps were final tightened and the towers removed.  Wounds were irrigated thoroughly. DBF and autograft was placed in the right lateral gutters.   Exparel mixed with Marcaine was injected into the paraspinous muscles and subcutaneous tissues bilaterally.  The fascia was closed with 0 Vicryl stitches.  The dermal layer was closed with 2-0 Vicryl stitches in buried interrupted fashion.  The skin incisions were closed with 4-0 Monocryl subcuticular manner followed by benzoin and Steri-Strips.  Sterile dressings were placed.  Patient was then flipped supine and extubated by the anesthesia service following commands and all 4  extremities.  All counts were correct at the end of surgery.  No complications were noted.

## 2021-12-26 NOTE — H&P (Signed)
CC: left leg pain and back pain  HPI:     Patient is a 61 y.o. male with hx of left L4-5 laminotomy and discectomy who developed a recurrent disc herniation as well as mechanical back pain.    Patient Active Problem List   Diagnosis Date Noted   Left lumbar radiculopathy 10/01/2021   Personal history of colonic polyps 05/05/2021   Other fatigue 05/05/2021   Post covid-19 condition, unspecified 05/05/2021   Rash 02/22/2020   Allergic conjunctivitis 02/22/2020   Lumbar radiculitis 07/01/2018   Pharyngitis 10/03/2017   Lymphadenopathy of left cervical region 10/03/2017   Eosinophilia 10/03/2017   Acute sinus infection 11/24/2015   Itching 11/11/2013   Visual disturbance 09/09/2012   Pterygium eye 09/09/2012   Facial swelling 06/21/2012   Eczema 05/07/2012   Constipation 04/27/2011   LYMPHADENITIS 11/29/2010   MIGRAINE HEADACHE 11/10/2010   OTITIS MEDIA 04/28/2010   TINNITUS 03/23/2010   Meralgia paresthetica 02/10/2010   NECK PAIN 09/01/2009   TOBACCO USE, QUIT 09/01/2009   GERD 12/08/2008   Sinusitis, chronic 10/31/2007   Allergic rhinitis 10/31/2007   LOW BACK PAIN 10/31/2007   Past Medical History:  Diagnosis Date   Allergic rhinitis    GERD (gastroesophageal reflux disease)    Low back pain    Sinusitis    Dr Constance Holster   Tinnitus 11/19/2009    Past Surgical History:  Procedure Laterality Date   BACK SURGERY  11/19/2005   EYE SURGERY     NASAL SINUS SURGERY  11/19/2006    Medications Prior to Admission  Medication Sig Dispense Refill Last Dose   acetaminophen (TYLENOL) 650 MG CR tablet Take 1,300 mg by mouth every 8 (eight) hours as needed for pain.   Past Month   fluticasone (FLONASE) 50 MCG/ACT nasal spray Place 2 sprays into both nostrils daily. (Patient taking differently: Place 2 sprays into both nostrils daily as needed for rhinitis.) 16 g 6 Past Week   HYDROcodone-acetaminophen (NORCO) 7.5-325 MG tablet Take 1 tablet by mouth every 6 (six) hours as  needed for moderate pain. 30 tablet 0 Past Week   levocetirizine (XYZAL) 5 MG tablet Take 1 tablet (5 mg total) by mouth every evening. 90 tablet 3 Past Week   Polyethyl Glycol-Propyl Glycol (SYSTANE OP) Place 1 drop into both eyes daily as needed (Irritation).   Past Week   predniSONE (DELTASONE) 10 MG tablet Take 10 mg by mouth every 6 (six) hours as needed (Moderiate pain).   Past Week   EPINEPHrine (EPIPEN) 0.3 mg/0.3 mL DEVI Inject 0.3 mLs (0.3 mg total) into the muscle once. (Patient not taking: Reported on 12/18/2021) 1 Device 0 Not Taking   montelukast (SINGULAIR) 10 MG tablet Take 1 tablet (10 mg total) by mouth at bedtime. (Patient not taking: Reported on 11/06/2021) 90 tablet 3 Not Taking   Allergies  Allergen Reactions   Shellfish Allergy Itching    Crab, lobsters and shrimp.     Social History   Tobacco Use   Smoking status: Former    Types: Cigarettes    Quit date: 2014    Years since quitting: 9.1   Smokeless tobacco: Never  Substance Use Topics   Alcohol use: Yes    Comment: social    Family History  Problem Relation Age of Onset   Cancer Neg Hx      Review of Systems Pertinent items noted in HPI and remainder of comprehensive ROS otherwise negative.  Objective:   Patient Vitals for the past  8 hrs:  BP Temp Temp src Pulse Resp SpO2 Height Weight  12/26/21 0616 136/85 98.3 F (36.8 C) Oral 70 17 98 % 5\' 9"  (1.753 m) 104.3 kg  12/26/21 0556 -- -- -- -- -- -- 5\' 9"  (1.753 m) 104.3 kg   No intake/output data recorded. No intake/output data recorded.      General : Alert, cooperative, no distress, appears stated age   Head:  Normocephalic/atraumatic    Eyes: PERRL, conjunctiva/corneas clear, EOM's intact. Fundi could not be visualized Neck: Supple Chest:  Respirations unlabored Chest wall: no tenderness or deformity Heart: Regular rate and rhythm Abdomen: Soft, nontender and nondistended Extremities: warm and well-perfused Skin: normal turgor, color  and texture Neurologic:  Alert, oriented x 3.  Eyes open spontaneously. PERRL, EOMI, VFC, no facial droop. V1-3 intact.  No dysarthria, tongue protrusion symmetric.  CNII-XII intact. Normal strength, sensation and reflexes throughout, except left DF 4/4, EHL 3/5.  No pronator drift, full strength in legs       Data ReviewCBC:  Lab Results  Component Value Date   WBC 10.6 (H) 12/22/2021   RBC 5.77 12/22/2021   BMP:  Lab Results  Component Value Date   GLUCOSE 103 (H) 05/05/2021   CO2 28 05/05/2021   BUN 16 05/05/2021   CREATININE 1.01 05/05/2021   CREATININE 0.82 09/14/2015   CALCIUM 9.5 05/05/2021   Radiology review:  See clinic note for details  Assessment:   Active Problems:   * No active hospital problems. *  Recurrent L4-5 disc herniation with instability  Plan:   - MIS TLIF at left L4-5

## 2021-12-27 ENCOUNTER — Encounter (HOSPITAL_COMMUNITY): Payer: Self-pay | Admitting: Neurosurgery

## 2021-12-27 DIAGNOSIS — Z8616 Personal history of COVID-19: Secondary | ICD-10-CM | POA: Diagnosis not present

## 2021-12-27 DIAGNOSIS — Z87891 Personal history of nicotine dependence: Secondary | ICD-10-CM | POA: Diagnosis not present

## 2021-12-27 DIAGNOSIS — M5126 Other intervertebral disc displacement, lumbar region: Secondary | ICD-10-CM | POA: Diagnosis not present

## 2021-12-27 DIAGNOSIS — M5416 Radiculopathy, lumbar region: Secondary | ICD-10-CM | POA: Diagnosis not present

## 2021-12-27 MED ORDER — METHOCARBAMOL 750 MG PO TABS: 750.0000 mg | ORAL_TABLET | Freq: Four times a day (QID) | ORAL | 1 refills | Status: DC

## 2021-12-27 MED ORDER — OXYCODONE-ACETAMINOPHEN 5-325 MG PO TABS: 1.0000 | ORAL_TABLET | ORAL | 0 refills | Status: DC | PRN

## 2021-12-27 NOTE — Evaluation (Signed)
Physical Therapy Evaluation and Discharge Patient Details Name: Joe Hawkins MRN: [id number] DOB: 1961/05/24 Today's Date: 12/27/2021  History of Present Illness  Pt is a 61 yo male who presents s/p TLIF L4-5 on 12/26/21. PMH significant for back surgery 2007.  Clinical Impression  Patient evaluated by Physical Therapy with no further acute PT needs identified. All education has been completed and the patient has no further questions. Pt was able to demonstrate transfers and ambulation with gross modified independence and no AD. Pt was educated on precautions, brace application/wearing schedule, appropriate activity progression, and car transfer. See below for any follow-up Physical Therapy or equipment needs. PT is signing off. Thank you for this referral.        Recommendations for follow up therapy are one component of a multi-disciplinary discharge planning process, led by the attending physician.  Recommendations may be updated based on patient status, additional functional criteria and insurance authorization.  Follow Up Recommendations No PT follow up    Assistance Recommended at Discharge PRN  Patient can return home with the following  Help with stairs or ramp for entrance;Assist for transportation    Equipment Recommendations None recommended by PT  Recommendations for Other Services       Functional Status Assessment Patient has had a recent decline in their functional status and demonstrates the ability to make significant improvements in function in a reasonable and predictable amount of time.     Precautions / Restrictions Precautions Precautions: Fall;Back Precaution Booklet Issued: Yes (comment) Precaution Comments: Reviewed handouy and pt was cued for precautions during functional mobility. Required Braces or Orthoses: Spinal Brace Spinal Brace: Applied in sitting position Restrictions Weight Bearing Restrictions: No      Mobility  Bed Mobility Overal bed mobility:  Modified Independent Bed Mobility: Rolling, Sidelying to Sit           General bed mobility comments: HOB flat and rails lowered to simulate home environment. No assist required however cues provided throughout for optimal log roll technique.    Transfers Overall transfer level: Modified independent Equipment used: None Transfers: Sit to/from Stand             General transfer comment: No assist required. Pt declines attempting with RW. NO unsteadiness or LOB noted.    Ambulation/Gait Ambulation/Gait assistance: Modified independent (Device/Increase time) Gait Distance (Feet): 250 Feet Assistive device: None Gait Pattern/deviations: Step-through pattern, Decreased stride length Gait velocity: Decreased Gait velocity interpretation: 1.31 - 2.62 ft/sec, indicative of limited community ambulator   General Gait Details: Slow and guarded due to pain but overall without unsteadiness or LOB.  Stairs Stairs: Yes Stairs assistance: Supervision Stair Management: One rail Right, Forwards, Alternating pattern Number of Stairs: 5 General stair comments: VC's for general safety. Pt was able to negotiate stairs without difficulty.  Wheelchair Mobility    Modified Rankin (Stroke Patients Only)       Balance Overall balance assessment: Needs assistance Sitting-balance support: Feet supported Sitting balance-Leahy Scale: Good     Standing balance support: During functional activity, No upper extremity supported Standing balance-Leahy Scale: Fair                               Pertinent Vitals/Pain Pain Assessment Pain Assessment: Faces Faces Pain Scale: Hurts a little bit Pain Location: Incision site Pain Descriptors / Indicators: Discomfort, Grimacing, Operative site guarding Pain Intervention(s): Limited activity within patient's tolerance, Monitored during session, Repositioned  Home Living Family/patient expects to be discharged to:: Private  residence Living Arrangements: Spouse/significant other Available Help at Discharge: Family;Available 24 hours/day Type of Home: House Home Access: Stairs to enter   [employer] of Steps: 3   Home Layout: One level Home Equipment: None Additional Comments: spouse plans to stay homewith pt for 2-3 weeks if needed    Prior Function Prior Level of Function : Independent/Modified Independent;Driving;Working/employed             Mobility Comments: no AD       Hand Dominance        Extremity/Trunk Assessment   Upper Extremity Assessment Upper Extremity Assessment: Defer to OT evaluation    Lower Extremity Assessment Lower Extremity Assessment: Overall WFL for tasks assessed (Mild weakness consistent with pre-op diagnosis but overall WFL)    Cervical / Trunk Assessment Cervical / Trunk Assessment: Back Surgery  Communication   Communication: Prefers language other than English (spouse translating as needed)  Cognition Arousal/Alertness: Awake/alert Behavior During Therapy: WFL for tasks assessed/performed Overall Cognitive Status: Within Functional Limits for tasks assessed                                          General Comments      Exercises     Assessment/Plan    PT Assessment Patient does not need any further PT services  PT Problem List         PT Treatment Interventions      PT Goals (Current goals can be found in the Care Plan section)  Acute Rehab PT Goals Patient Stated Goal: None stated PT Goal Formulation: All assessment and education complete, DC therapy    Frequency       Co-evaluation               AM-PAC PT "6 Clicks" Mobility  Outcome Measure Help needed turning from your back to your side while in a flat bed without using bedrails?: None Help needed moving from lying on your back to sitting on the side of a flat bed without using bedrails?: None Help needed moving to and from a bed to a chair  (including a wheelchair)?: None Help needed standing up from a chair using your arms (e.g., wheelchair or bedside chair)?: None Help needed to walk in hospital room?: None Help needed climbing 3-5 steps with a railing? : A Little 6 Click Score: 23    End of Session Equipment Utilized During Treatment: Back brace Activity Tolerance: Patient tolerated treatment well Patient left: in bed;with call bell/phone within reach;with family/visitor present Nurse Communication: Mobility status PT Visit Diagnosis: Unsteadiness on feet (R26.81);Pain Pain - part of body:  (back)    Time: 9371-6967 PT Time Calculation (min) (ACUTE ONLY): 14 min   Charges:   PT Evaluation $PT Eval Low Complexity: 1 Low          Rolinda Roan, PT, DPT Acute Rehabilitation Services Pager: [phone] Office: [phone]   Thelma Comp 12/27/2021, 12:31 PM

## 2021-12-27 NOTE — Discharge Summary (Signed)
Physician Discharge Summary  Patient ID: Joe Hawkins MRN: [id number] DOB/AGE: [birth year] 61 y.o.  Admit date: 12/26/2021 Discharge date: 12/27/2021  Admission Diagnoses: Recurrent disc herniation with radiculopathy  Discharge Diagnoses: Recurrent disc herniation with radiculopathy Principal Problem:   Lumbar radiculopathy   Discharged Condition: good  Hospital Course: The patient was admitted on 12/26/2021 and taken to the operating room where the patient underwent MIS TLIF L4-5, Left. The patient tolerated the procedure well and was taken to the recovery room and then to the floor in stable condition. The hospital course was routine. There were no complications. The wound remained clean dry and intact. Pt had appropriate back soreness. No complaints of leg pain or new N/T/W. The patient remained afebrile with stable vital signs, and tolerated a regular diet. The patient continued to increase activities, and pain was well controlled with oral pain medications.   Consults: None  Significant Diagnostic Studies: radiology: X-Ray: intraoperative   Treatments: surgery:   1. Left L4-5 lumbar interbody fusion via transforaminal approach with tubular retractors 2. Posterior arthrodesis, L4-5 2. Placement of interbody cage L4-5 3. Nonsegmental instrumentation with percutaneously placed pedicle screw and rod construct at L4-5 4. Harvest of local autograft 5. Use of morselized allograft 6. Use of microscope for microdissection   Discharge Exam: Blood pressure 118/74, pulse 67, temperature 98.1 F (36.7 C), temperature source Oral, resp. rate 18, height 5\' 9"  (1.753 m), weight 104.3 kg, SpO2 96 %. Physical Exam: Patient is awake, A/O X 4, conversant, and in good spirits. They are in NAD and VSS. Doing well. Speech is fluent and appropriate. MAEW with good strength. 5/5 BUE/BLE, Left EHL 4+/5. Sensation to light touch is intact. PERLA, EOMI. CNs grossly intact. Dressing is clean dry intact.  Incision is well approximated with no drainage, erythema, or edema.   Disposition: Discharge disposition: 01-Home or Self Care       Discharge Instructions     Incentive spirometry RT   Complete by: As directed       Allergies as of 12/27/2021       Reactions   Shellfish Allergy Itching   Crab, lobsters and shrimp.         Medication List     STOP taking these medications    HYDROcodone-acetaminophen 7.5-325 MG tablet Commonly known as: NORCO   predniSONE 10 MG tablet Commonly known as: DELTASONE       TAKE these medications    acetaminophen 650 MG CR tablet Commonly known as: TYLENOL Take 1,300 mg by mouth every 8 (eight) hours as needed for pain.   EPINEPHrine 0.3 mg/0.3 mL Devi Commonly known as: EpiPen Inject 0.3 mLs (0.3 mg total) into the muscle once.   fluticasone 50 MCG/ACT nasal spray Commonly known as: FLONASE Place 2 sprays into both nostrils daily. What changed:  when to take this reasons to take this   levocetirizine 5 MG tablet Commonly known as: Xyzal Take 1 tablet (5 mg total) by mouth every evening.   methocarbamol 750 MG tablet Commonly known as: Robaxin-750 Take 1 tablet (750 mg total) by mouth 4 (four) times daily.   montelukast 10 MG tablet Commonly known as: SINGULAIR Take 1 tablet (10 mg total) by mouth at bedtime.   oxyCODONE-acetaminophen 5-325 MG tablet Commonly known as: Percocet Take 1-2 tablets by mouth every 4 (four) hours as needed for severe pain.   SYSTANE OP Place 1 drop into both eyes daily as needed (Irritation).         Signed:  Marvis Moeller, DNP, NP-C 12/27/2021, 8:01 AM

## 2021-12-27 NOTE — Discharge Instructions (Signed)
Wound Care Remove dressing in 2-3 days Leave incision open to air. You may shower. Do not scrub directly on incision.  Do not put any creams, lotions, or ointments on incision. Activity Walk each and every day, increasing distance each day. No lifting greater than 5 lbs.  Avoid bending, arching, and twisting. No driving for 2 weeks; may ride as a passenger locally. If provided with back brace, wear when out of bed.  It is not necessary to wear in bed. Diet Resume your normal diet.  Return to Work Will be discussed at you follow up appointment. Call Your Doctor If Any of These Occur Redness, drainage, or swelling at the wound.  Temperature greater than 101 degrees. Severe pain not relieved by pain medication. Incision starts to come apart. Follow Up Appt Call today for appointment in 2-3 weeks ([phone]) or for problems.  If you have any hardware placed in your spine, you will need an x-ray before your appointment.

## 2021-12-27 NOTE — Plan of Care (Signed)
Patient alert and oriented, mae's well, voiding adequate amount of urine, swallowing without difficulty, no c/o pain at time of discharge. Patient discharged home with family. Script and discharged instructions given to patient. Patient and family stated understanding of instructions given. Patient has an appointment with Dr. Marcello Moores In 2 weeks

## 2021-12-27 NOTE — Evaluation (Signed)
Occupational Therapy Evaluation Patient Details Name: Joe Hawkins MRN: [id number] DOB: 05-01-1961 Today's Date: 12/27/2021   History of Present Illness Joe Hawkins is a 61 yo male who underwent MIS TLIF L4-5, Left 2/6. PMHx: GERD, back surgery 2007   Clinical Impression   Joe Hawkins was evaluated s/p the above back sx. He is indep at baseline including driving and working. He lives in a 1 level home with 3 STE with his spouse who plans to assist 24/7 at d/c. Pt's s.o. provided interpretation as needed throughout. After review, pt verbalized great understand of back precautions, brace wear/care schedule and compensatory techniques for ADLs. Overall he was supervision for sitting ADLs and min guard for OOB ADLs with minimal verbal cues for back precautions. Pt does not have further acute OT needs. Recommend d/c to home without follow up OT.      Recommendations for follow up therapy are one component of a multi-disciplinary discharge planning process, led by the attending physician.  Recommendations may be updated based on patient status, additional functional criteria and insurance authorization.   Follow Up Recommendations  No OT follow up    Assistance Recommended at Discharge Intermittent Supervision/Assistance  Patient can return home with the following A little help with walking and/or transfers;A little help with bathing/dressing/bathroom;Assist for transportation;Help with stairs or ramp for entrance;Assistance with cooking/housework    Functional Status Assessment  Patient has had a recent decline in their functional status and demonstrates the ability to make significant improvements in function in a reasonable and predictable amount of time.  Equipment Recommendations  Other (comment) (RW)       Precautions / Restrictions Precautions Precautions: Fall;Back Precaution Booklet Issued: Yes (comment) Required Braces or Orthoses: Spinal Brace Spinal Brace: Applied in sitting position       Mobility Bed Mobility Overal bed mobility: Needs Assistance             General bed mobility comments: pt sitting EOB upon arrival, verbally reviewed log roll    Transfers Overall transfer level: Needs assistance Equipment used: None Transfers: Sit to/from Stand Sit to Stand: Min guard           General transfer comment: would benefit from RW for safety      Balance Overall balance assessment: Needs assistance Sitting-balance support: Feet supported Sitting balance-Leahy Scale: Good     Standing balance support: During functional activity, No upper extremity supported Standing balance-Leahy Scale: Fair           ADL either performed or assessed with clinical judgement   ADL Overall ADL's : Needs assistance/impaired                 General ADL Comments: Pt demonstrated set up level upper body ADLs in sitting including brace and min guard lower body ADLs with verbal cues for compensatory techniques to maintain back precautions. Reviewed bathing, driving and IADLs     Vision Baseline Vision/History: 0 No visual deficits Ability to See in Adequate Light: 0 Adequate Patient Visual Report: No change from baseline Vision Assessment?: No apparent visual deficits            Pertinent Vitals/Pain Pain Assessment Pain Assessment: Faces Faces Pain Scale: Hurts a little bit Pain Location: sx site Pain Descriptors / Indicators: Discomfort, Grimacing, Guarding Pain Intervention(s): Limited activity within patient's tolerance, Monitored during session     Hand Dominance     Extremity/Trunk Assessment Upper Extremity Assessment Upper Extremity Assessment: Overall WFL for tasks assessed   Lower Extremity  Assessment Lower Extremity Assessment: Defer to PT evaluation (some numbness in LLE)   Cervical / Trunk Assessment Cervical / Trunk Assessment: Back Surgery   Communication Communication Communication: Prefers language other than English (spouse  translating as needed)   Cognition Arousal/Alertness: Awake/alert Behavior During Therapy: WFL for tasks assessed/performed Overall Cognitive Status: Within Functional Limits for tasks assessed         General Comments: verbalized understanding after review     General Comments  VSS on RA, spouse present and supportive     Home Living Family/patient expects to be discharged to:: Private residence Living Arrangements: Spouse/significant other Available Help at Discharge: Family;Available 24 hours/day Type of Home: House Home Access: Stairs to enter [employer] of Steps: 3   Home Layout: One level     Bathroom Shower/Tub: Teacher, early years/pre: Standard     Home Equipment: None   Additional Comments: spouse plans to stay homewith pt for 2-3 weeks if needed      Prior Functioning/Environment Prior Level of Function : Independent/Modified Independent;Driving;Working/employed             Mobility Comments: no AD ADLs Comments: works with Teacher, adult education        OT Problem List: Decreased strength;Impaired balance (sitting and/or standing);Decreased activity tolerance;Decreased range of motion;Decreased safety awareness;Decreased knowledge of use of DME or AE;Decreased knowledge of precautions      OT Treatment/Interventions: Therapeutic exercise    OT Goals(Current goals can be found in the care plan section) Acute Rehab OT Goals Patient Stated Goal: home OT Goal Formulation: With patient Time For Goal Achievement: 12/27/21  OT Frequency: Min 2X/week       AM-PAC OT "6 Clicks" Daily Activity     Outcome Measure Help from another person eating meals?: None Help from another person taking care of personal grooming?: A Little Help from another person toileting, which includes using toliet, bedpan, or urinal?: A Little Help from another person bathing (including washing, rinsing, drying)?: A Little Help from another person to put on and taking  off regular upper body clothing?: None Help from another person to put on and taking off regular lower body clothing?: A Little 6 Click Score: 20   End of Session Equipment Utilized During Treatment: Gait belt Nurse Communication: Mobility status  Activity Tolerance: Patient tolerated treatment well Patient left: in bed;with call bell/phone within reach;with family/visitor present  OT Visit Diagnosis: Unsteadiness on feet (R26.81);Other abnormalities of gait and mobility (R26.89);Muscle weakness (generalized) (M62.81)                Time: 8250-0370 OT Time Calculation (min): 12 min Charges:  OT General Charges $OT Visit: 1 Visit OT Evaluation $OT Eval Low Complexity: 1 Low  Sabastien Tyler A Jameon Deller 12/27/2021, 8:48 AM

## 2022-01-11 ENCOUNTER — Ambulatory Visit (INDEPENDENT_AMBULATORY_CARE_PROVIDER_SITE_OTHER): Payer: 59 | Admitting: Nurse Practitioner

## 2022-01-11 ENCOUNTER — Other Ambulatory Visit: Payer: Self-pay

## 2022-01-11 ENCOUNTER — Encounter: Payer: Self-pay | Admitting: Nurse Practitioner

## 2022-01-11 VITALS — BP 126/74 | HR 70 | Temp 98.0°F | Ht 69.0 in | Wt 221.0 lb

## 2022-01-11 DIAGNOSIS — R300 Dysuria: Secondary | ICD-10-CM | POA: Diagnosis not present

## 2022-01-11 DIAGNOSIS — R350 Frequency of micturition: Secondary | ICD-10-CM

## 2022-01-11 LAB — POCT URINALYSIS DIPSTICK
Bilirubin, UA: 1
Blood, UA: NEGATIVE
Glucose, UA: NEGATIVE
Ketones, UA: NEGATIVE
Leukocytes, UA: NEGATIVE
Nitrite, UA: NEGATIVE
Protein, UA: NEGATIVE
Spec Grav, UA: 1.025 (ref 1.010–1.025)
Urobilinogen, UA: 0.2 E.U./dL
pH, UA: 6 (ref 5.0–8.0)

## 2022-01-11 LAB — COMPREHENSIVE METABOLIC PANEL
ALT: 49 U/L (ref 0–53)
AST: 38 U/L — ABNORMAL HIGH (ref 0–37)
Albumin: 4 g/dL (ref 3.5–5.2)
Alkaline Phosphatase: 74 U/L (ref 39–117)
BUN: 14 mg/dL (ref 6–23)
CO2: 32 mEq/L (ref 19–32)
Calcium: 9.6 mg/dL (ref 8.4–10.5)
Chloride: 101 mEq/L (ref 96–112)
Creatinine, Ser: 0.97 mg/dL (ref 0.40–1.50)
GFR: 83.39 mL/min (ref >60.00)
Glucose, Bld: 92 mg/dL (ref 70–99)
Potassium: 4.1 mEq/L (ref 3.5–5.1)
Sodium: 139 mEq/L (ref 135–145)
Total Bilirubin: 0.4 mg/dL (ref 0.2–1.2)
Total Protein: 7.6 g/dL (ref 6.0–8.3)

## 2022-01-11 LAB — CBC
HCT: 42.4 % (ref 39.0–52.0)
Hemoglobin: 13.7 g/dL (ref 13.0–17.0)
MCHC: 32.4 g/dL (ref 30.0–36.0)
MCV: 78.3 fl (ref 78.0–100.0)
Platelets: 329 10*3/uL (ref 150.0–400.0)
RBC: 5.41 Mil/uL (ref 4.22–5.81)
RDW: 13.9 % (ref 11.5–15.5)
WBC: 9.8 10*3/uL (ref 4.0–10.5)

## 2022-01-11 LAB — PSA: PSA: 0.54 ng/mL (ref 0.10–4.00)

## 2022-01-11 MED ORDER — OXYBUTYNIN CHLORIDE 5 MG PO TABS: 5.0000 mg | ORAL_TABLET | Freq: Three times a day (TID) | ORAL | 2 refills | Status: DC

## 2022-01-11 NOTE — Progress Notes (Signed)
Subjective:  Patient ID: Joe Hawkins, male    DOB: [birth year]  Age: 61 y.o. MRN: [id number]  CC:  Chief Complaint  Patient presents with   Urinary Frequency    Has been going on since his surgery a week ago. Accompanied by dysuria.       HPI  This patient arrives today for the above.  Interpreter used today for visit, interpreter ID is [id number].  He has been experiencing urinary frequency for the last week as well as some dysuria.  He did have minimally invasive transforaminal lumbar interbody fusion at L4-5 about 2 weeks ago.  He tells me he went and saw his neurosurgeon yesterday regarding the symptoms and was told that the symptoms are unlikely to be related to his recent surgery.  He denies any weakness or sensory changes in his lower extremities as well as denies any bowel incontinence.  He does feel some burning sensation when he urinates as well as in his groin area.  He denies any new sexual contacts.  He tells me that he just seems to be getting up to go urinate more frequently especially at night.  He tells me before his surgery he would get up at night maybe once and since then he has been experiencing nocturia 4-5 times a night.  He denies any hematuria or fever.  Past Medical History:  Diagnosis Date   Allergic rhinitis    GERD (gastroesophageal reflux disease)    Low back pain    Sinusitis    Dr Constance Holster   Tinnitus 11/19/2009      Family History  Problem Relation Age of Onset   Cancer Neg Hx     Social History   Social History Narrative   Married   Social History   Tobacco Use   Smoking status: Former    Types: Cigarettes    Quit date: 2014    Years since quitting: 9.1   Smokeless tobacco: Never  Substance Use Topics   Alcohol use: Yes    Comment: social     Current Meds  Medication Sig   acetaminophen (TYLENOL) 650 MG CR tablet Take 1,300 mg by mouth every 8 (eight) hours as needed for pain.   fluticasone (FLONASE) 50 MCG/ACT nasal spray Place 2  sprays into both nostrils daily. (Patient taking differently: Place 2 sprays into both nostrils daily as needed for rhinitis.)   levocetirizine (XYZAL) 5 MG tablet Take 1 tablet (5 mg total) by mouth every evening.   methocarbamol (ROBAXIN-750) 750 MG tablet Take 1 tablet (750 mg total) by mouth 4 (four) times daily.   oxybutynin (DITROPAN) 5 MG tablet Take 1 tablet (5 mg total) by mouth 3 (three) times daily.   oxyCODONE-acetaminophen (PERCOCET) 5-325 MG tablet Take 1-2 tablets by mouth every 4 (four) hours as needed for severe pain.   Polyethyl Glycol-Propyl Glycol (SYSTANE OP) Place 1 drop into both eyes daily as needed (Irritation).    ROS:  Review of Systems  Constitutional:  Negative for fever and weight loss.  Gastrointestinal:        (-) bowel incontinent  Genitourinary:  Positive for dysuria and frequency. Negative for flank pain, hematuria and urgency.       (+) urinary incontinence, (+) burning in the groin     Objective:   Today's Vitals: BP 126/74 (BP Location: Left Arm, Patient Position: Sitting, Cuff Size: Large)    Pulse 70    Temp 98 F (36.7 C) (Oral)  Ht 5\' 9"  (1.753 m)    Wt 221 lb (100.2 kg)    SpO2 98%    BMI 32.64 kg/m  Vitals with BMI 01/11/2022 12/27/2021 12/27/2021  Height 5\' 9"  - -  Weight 221 lbs - -  BMI 38.46 - -  Systolic [phone]  Diastolic 74 74 65  Pulse 70 67 75     Physical Exam Vitals reviewed.  Constitutional:      Appearance: Normal appearance.  HENT:     Head: Normocephalic and atraumatic.  Cardiovascular:     Rate and Rhythm: Normal rate and regular rhythm.  Pulmonary:     Effort: Pulmonary effort is normal.     Breath sounds: Normal breath sounds.  Abdominal:     Tenderness: There is no right CVA tenderness or left CVA tenderness.  Musculoskeletal:     Cervical back: Neck supple.  Skin:    General: Skin is warm and dry.  Neurological:     Mental Status: He is alert and oriented to person, place, and time.  Psychiatric:         Mood and Affect: Mood normal.        Behavior: Behavior normal.        Thought Content: Thought content normal.        Judgment: Judgment normal.    Point-of-care urinalysis: See results tab     Assessment and Plan   1. Dysuria   2. Urinary frequency      Plan: 1.,  2.  No sign of UTI on point-of-care UA.  Will send urine out for culture just to verify.  I had a long discussion regarding concern for cauda equina syndrome with the patient and we did discuss that if this is not emergently diagnosed and treated symptoms could be permanent if it is related to cauda equina syndrome.  Based on patient's recent conversation with his neurosurgeon he is not concerned that this could be playing a part.  Additionally, he had good sensation and strength in his lower extremities.  We did discuss possibly sending him to the emergency department to rule this out, however he would prefer not to do this at this time.  I will get blood work for further evaluation as well as we will trial oxybutynin to see if this helps with the frequency.  We did discuss possible side effects of the medication and he will follow-up in approximately 1 month for close monitoring.  If symptoms persist would consider referral to urology.   Tests ordered Orders Placed This Encounter  Procedures   Urine Culture   Comprehensive metabolic panel   CBC   PSA   POCT Urinalysis Dipstick      Meds ordered this encounter  Medications   oxybutynin (DITROPAN) 5 MG tablet    Sig: Take 1 tablet (5 mg total) by mouth 3 (three) times daily.    Dispense:  30 tablet    Refill:  2    Order Specific Question:   Supervising Provider    Answer:   Binnie Rail [id number]    Patient to follow-up in 1 month, or sooner as needed.  Ailene Ards, NP

## 2022-01-12 LAB — URINE CULTURE

## 2022-02-08 ENCOUNTER — Ambulatory Visit: Payer: 59 | Admitting: Internal Medicine

## 2022-02-22 ENCOUNTER — Ambulatory Visit: Payer: 59 | Admitting: Internal Medicine

## 2022-03-09 DIAGNOSIS — Z6831 Body mass index (BMI) 31.0-31.9, adult: Secondary | ICD-10-CM | POA: Diagnosis not present

## 2022-03-09 DIAGNOSIS — M5416 Radiculopathy, lumbar region: Secondary | ICD-10-CM | POA: Diagnosis not present

## 2022-03-12 ENCOUNTER — Ambulatory Visit (INDEPENDENT_AMBULATORY_CARE_PROVIDER_SITE_OTHER): Payer: 59 | Admitting: Internal Medicine

## 2022-03-12 ENCOUNTER — Encounter: Payer: Self-pay | Admitting: Internal Medicine

## 2022-03-12 ENCOUNTER — Ambulatory Visit (INDEPENDENT_AMBULATORY_CARE_PROVIDER_SITE_OTHER): Payer: 59

## 2022-03-12 VITALS — BP 102/58 | HR 48 | Temp 98.7°F | Ht 69.0 in | Wt 228.0 lb

## 2022-03-12 DIAGNOSIS — R1032 Left lower quadrant pain: Secondary | ICD-10-CM | POA: Insufficient documentation

## 2022-03-12 DIAGNOSIS — K5669 Other partial intestinal obstruction: Secondary | ICD-10-CM

## 2022-03-12 DIAGNOSIS — G8929 Other chronic pain: Secondary | ICD-10-CM

## 2022-03-12 DIAGNOSIS — M545 Low back pain, unspecified: Secondary | ICD-10-CM

## 2022-03-12 LAB — CBC WITH DIFFERENTIAL/PLATELET
Basophils Absolute: 0 10*3/uL (ref 0.0–0.1)
Basophils Relative: 0.4 % (ref 0.0–3.0)
Eosinophils Absolute: 1.1 10*3/uL — ABNORMAL HIGH (ref 0.0–0.7)
Eosinophils Relative: 10.6 % — ABNORMAL HIGH (ref 0.0–5.0)
HCT: 45.2 % (ref 39.0–52.0)
Hemoglobin: 14.8 g/dL (ref 13.0–17.0)
Lymphocytes Relative: 32.8 % (ref 12.0–46.0)
Lymphs Abs: 3.4 10*3/uL (ref 0.7–4.0)
MCHC: 32.8 g/dL (ref 30.0–36.0)
MCV: 79.2 fl (ref 78.0–100.0)
Monocytes Absolute: 1.1 10*3/uL — ABNORMAL HIGH (ref 0.1–1.0)
Monocytes Relative: 10.6 % (ref 3.0–12.0)
Neutro Abs: 4.8 10*3/uL (ref 1.4–7.7)
Neutrophils Relative %: 45.6 % (ref 43.0–77.0)
Platelets: 241 10*3/uL (ref 150.0–400.0)
RBC: 5.71 Mil/uL (ref 4.22–5.81)
RDW: 13.8 % (ref 11.5–15.5)
WBC: 10.5 10*3/uL (ref 4.0–10.5)

## 2022-03-12 LAB — URINALYSIS, ROUTINE W REFLEX MICROSCOPIC
Bilirubin Urine: NEGATIVE
Ketones, ur: NEGATIVE
Leukocytes,Ua: NEGATIVE
Nitrite: NEGATIVE
Specific Gravity, Urine: 1.01 (ref 1.000–1.030)
Total Protein, Urine: NEGATIVE
Urine Glucose: NEGATIVE
Urobilinogen, UA: 0.2 (ref 0.0–1.0)
pH: 6.5 (ref 5.0–8.0)

## 2022-03-12 MED ORDER — MELOXICAM 15 MG PO TABS: 15.0000 mg | ORAL_TABLET | Freq: Every day | ORAL | 0 refills | Status: DC | PRN

## 2022-03-12 MED ORDER — AMOXICILLIN-POT CLAVULANATE 875-125 MG PO TABS: 1.0000 | ORAL_TABLET | Freq: Two times a day (BID) | ORAL | 0 refills | Status: DC

## 2022-03-12 NOTE — Assessment & Plan Note (Signed)
S/p LS surgery on Feb 7,2023 - left L4-5 laminotomy and discectomy for a recurrent disc herniation as well as mechanical back pain (Dr Marcello Moores). ?

## 2022-03-12 NOTE — Assessment & Plan Note (Addendum)
New 4/23 - MSK pain vs diverticulitis ?Abd X ray ?UA, CBC ?Empiric Augmentin x 10 d ?RTC 2-3 wks ?Meloxicam po prn ?

## 2022-03-12 NOTE — Progress Notes (Signed)
? ?Subjective:  ?Patient ID: Joe Hawkins, male    DOB: 03-01-1961  Age: 61 y.o. MRN: [id number] ? ?CC: No chief complaint on file. ? ? ?HPI ?Joe Hawkins presents for s/p LS surgery on Feb 7,2023 - left L4-5 laminotomy and discectomy for a recurrent disc herniation as well as mechanical back pain (Dr Marcello Moores). ? ?C/o abd pain after surgery worse at night and w/movements ? ?Outpatient Medications Prior to Visit  ?Medication Sig Dispense Refill  ? acetaminophen (TYLENOL) 650 MG CR tablet Take 1,300 mg by mouth every 8 (eight) hours as needed for pain.    ? cyclobenzaprine (FLEXERIL) 10 MG tablet Take 10 mg by mouth 3 (three) times daily.    ? levocetirizine (XYZAL) 5 MG tablet Take 1 tablet (5 mg total) by mouth every evening. 90 tablet 3  ? methocarbamol (ROBAXIN-750) 750 MG tablet Take 1 tablet (750 mg total) by mouth 4 (four) times daily. 90 tablet 1  ? oxybutynin (DITROPAN) 5 MG tablet Take 1 tablet (5 mg total) by mouth 3 (three) times daily. 30 tablet 2  ? Polyethyl Glycol-Propyl Glycol (SYSTANE OP) Place 1 drop into both eyes daily as needed (Irritation).    ? oxyCODONE-acetaminophen (PERCOCET) 5-325 MG tablet Take 1-2 tablets by mouth every 4 (four) hours as needed for severe pain. 20 tablet 0  ? EPINEPHrine (EPIPEN) 0.3 mg/0.3 mL DEVI Inject 0.3 mLs (0.3 mg total) into the muscle once. (Patient not taking: Reported on 12/18/2021) 1 Device 0  ? fluticasone (FLONASE) 50 MCG/ACT nasal spray Place 2 sprays into both nostrils daily. (Patient not taking: Reported on 03/12/2022) 16 g 6  ? montelukast (SINGULAIR) 10 MG tablet Take 1 tablet (10 mg total) by mouth at bedtime. (Patient not taking: Reported on 11/06/2021) 90 tablet 3  ? ?No facility-administered medications prior to visit.  ? ? ?ROS: ?Review of Systems  ?Constitutional:  Negative for appetite change, fatigue and unexpected weight change.  ?HENT:  Negative for congestion, nosebleeds, sneezing, sore throat and trouble swallowing.   ?Eyes:  Negative for itching  and visual disturbance.  ?Respiratory:  Negative for cough.   ?Cardiovascular:  Negative for chest pain, palpitations and leg swelling.  ?Gastrointestinal:  Positive for abdominal pain. Negative for abdominal distention, blood in stool, diarrhea, nausea and vomiting.  ?Genitourinary:  Negative for frequency and hematuria.  ?Musculoskeletal:  Negative for back pain, gait problem, joint swelling and neck pain.  ?Skin:  Negative for rash.  ?Neurological:  Negative for dizziness, tremors, speech difficulty and weakness.  ?Psychiatric/Behavioral:  Negative for agitation, dysphoric mood and sleep disturbance. The patient is not nervous/anxious.   ? ?Objective:  ?BP (!) 102/58 (BP Location: Left Arm, Patient Position: Sitting, Cuff Size: Large)   Pulse (!) 48   Temp 98.7 ?F (37.1 ?C) (Oral)   Ht '5\' 9"'$  (1.753 m)   Wt 228 lb (103.4 kg)   SpO2 96%   BMI 33.67 kg/m?  ? ?BP Readings from Last 3 Encounters:  ?03/12/22 (!) 102/58  ?01/11/22 126/74  ?12/27/21 118/74  ? ? ?Wt Readings from Last 3 Encounters:  ?03/12/22 228 lb (103.4 kg)  ?01/11/22 221 lb (100.2 kg)  ?12/26/21 230 lb (104.3 kg)  ? ? ?Physical Exam ?Constitutional:   ?   General: He is not in acute distress. ?   Appearance: He is well-developed. He is obese.  ?   Comments: NAD  ?Eyes:  ?   Conjunctiva/sclera: Conjunctivae normal.  ?   Pupils: Pupils are equal, round, and reactive  to light.  ?Neck:  ?   Thyroid: No thyromegaly.  ?   Vascular: No JVD.  ?Cardiovascular:  ?   Rate and Rhythm: Normal rate and regular rhythm.  ?   Heart sounds: Normal heart sounds. No murmur heard. ?  No friction rub. No gallop.  ?Pulmonary:  ?   Effort: Pulmonary effort is normal. No respiratory distress.  ?   Breath sounds: Normal breath sounds. No wheezing or rales.  ?Chest:  ?   Chest wall: No tenderness.  ?Abdominal:  ?   General: Bowel sounds are normal. There is no distension.  ?   Palpations: Abdomen is soft. There is no mass.  ?   Tenderness: There is abdominal tenderness.  There is no guarding or rebound.  ?Musculoskeletal:     ?   General: No tenderness. Normal range of motion.  ?   Cervical back: Normal range of motion.  ?Lymphadenopathy:  ?   Cervical: No cervical adenopathy.  ?Skin: ?   General: Skin is warm and dry.  ?   Findings: No rash.  ?Neurological:  ?   Mental Status: He is alert and oriented to person, place, and time.  ?   Cranial Nerves: No cranial nerve deficit.  ?   Motor: No abnormal muscle tone.  ?   Coordination: Coordination normal.  ?   Gait: Gait normal.  ?   Deep Tendon Reflexes: Reflexes are normal and symmetric.  ?Psychiatric:     ?   Behavior: Behavior normal.     ?   Thought Content: Thought content normal.     ?   Judgment: Judgment normal.  ?Mild LLQ tenderness ?No mass or hernia ?No rash ?LS - wounds are well healed ? ?Lab Results  ?Component Value Date  ? WBC 9.8 01/11/2022  ? HGB 13.7 01/11/2022  ? HCT 42.4 01/11/2022  ? PLT 329.0 01/11/2022  ? GLUCOSE 92 01/11/2022  ? CHOL 195 05/05/2021  ? TRIG 154.0 (H) 05/05/2021  ? HDL 47.60 05/05/2021  ? LDLCALC 117 (H) 05/05/2021  ? ALT 49 01/11/2022  ? AST 38 (H) 01/11/2022  ? NA 139 01/11/2022  ? K 4.1 01/11/2022  ? CL 101 01/11/2022  ? CREATININE 0.97 01/11/2022  ? BUN 14 01/11/2022  ? CO2 32 01/11/2022  ? TSH 1.30 05/05/2021  ? PSA 0.54 01/11/2022  ? HGBA1C 5.5 01/01/2017  ? ? ?DG Lumbar Spine 2-3 Views ? ?Result Date: 12/26/2021 ?CLINICAL DATA:  L4-L5 TLIF EXAM: LUMBAR SPINE - 2-3 VIEW COMPARISON:  None. FINDINGS: Intraoperative images during L4-L5 posterior and interbody fusion. Intact hardware without evidence of loosening. IMPRESSION: Intraoperative images during L4-L5 posterior and interbody fusion. No evidence of immediate hardware complication. Electronically Signed   By: Maurine Simmering M.D.   On: 12/26/2021 11:55  ? ?DG C-Arm 1-60 Min-No Report ? ?Result Date: 12/26/2021 ?Fluoroscopy was utilized by the requesting physician.  No radiographic interpretation.  ? ?DG C-Arm 1-60 Min-No Report ? ?Result Date:  12/26/2021 ?Fluoroscopy was utilized by the requesting physician.  No radiographic interpretation.  ? ?DG C-Arm 1-60 Min-No Report ? ?Result Date: 12/26/2021 ?Fluoroscopy was utilized by the requesting physician.  No radiographic interpretation.  ? ? ?Assessment & Plan:  ? ?Problem List Items Addressed This Visit   ? ? LOW BACK PAIN  ?  S/p LS surgery on Feb 7,2023 - left L4-5 laminotomy and discectomy for a recurrent disc herniation as well as mechanical back pain (Dr Marcello Moores). ?  ?  ? Relevant  Medications  ? cyclobenzaprine (FLEXERIL) 10 MG tablet  ? meloxicam (MOBIC) 15 MG tablet  ? LLQ abdominal pain  ?  New 4/23 - MSK pain vs diverticulitis ?Abd X ray ?UA, CBC ?Empiric Augmentin x 10 d ?RTC 2-3 wks ?Meloxicam po prn ? ?  ?  ? Relevant Orders  ? DG Abd 2 Views  ? CBC with Differential/Platelet  ? Urinalysis  ?  ? ? ?Meds ordered this encounter  ?Medications  ? DISCONTD: amoxicillin-clavulanate (AUGMENTIN) 875-125 MG tablet  ?  Sig: Take 1 tablet by mouth 2 (two) times daily.  ?  Dispense:  20 tablet  ?  Refill:  0  ? DISCONTD: meloxicam (MOBIC) 15 MG tablet  ?  Sig: Take 1 tablet (15 mg total) by mouth daily as needed for pain.  ?  Dispense:  30 tablet  ?  Refill:  0  ? amoxicillin-clavulanate (AUGMENTIN) 875-125 MG tablet  ?  Sig: Take 1 tablet by mouth 2 (two) times daily.  ?  Dispense:  20 tablet  ?  Refill:  0  ? meloxicam (MOBIC) 15 MG tablet  ?  Sig: Take 1 tablet (15 mg total) by mouth daily as needed for pain.  ?  Dispense:  30 tablet  ?  Refill:  0  ?  ? ? ?Follow-up: Return in about 3 weeks (around 04/02/2022) for a follow-up visit. ? ?Walker Kehr, MD ?

## 2022-03-13 NOTE — Addendum Note (Signed)
Addended by: Cassandria Anger on: 03/13/2022 07:55 AM ? ? Modules accepted: Orders ? ?

## 2022-03-29 ENCOUNTER — Encounter: Payer: Self-pay | Admitting: Internal Medicine

## 2022-03-29 ENCOUNTER — Ambulatory Visit (INDEPENDENT_AMBULATORY_CARE_PROVIDER_SITE_OTHER): Payer: 59 | Admitting: Internal Medicine

## 2022-03-29 ENCOUNTER — Telehealth: Payer: Self-pay | Admitting: Internal Medicine

## 2022-03-29 VITALS — BP 100/70 | HR 66 | Temp 98.0°F | Ht 69.0 in | Wt 226.0 lb

## 2022-03-29 DIAGNOSIS — G8929 Other chronic pain: Secondary | ICD-10-CM | POA: Diagnosis not present

## 2022-03-29 DIAGNOSIS — K5900 Constipation, unspecified: Secondary | ICD-10-CM

## 2022-03-29 DIAGNOSIS — R1032 Left lower quadrant pain: Secondary | ICD-10-CM

## 2022-03-29 DIAGNOSIS — M545 Low back pain, unspecified: Secondary | ICD-10-CM | POA: Diagnosis not present

## 2022-03-29 DIAGNOSIS — K5669 Other partial intestinal obstruction: Secondary | ICD-10-CM

## 2022-03-29 DIAGNOSIS — R682 Dry mouth, unspecified: Secondary | ICD-10-CM | POA: Insufficient documentation

## 2022-03-29 MED ORDER — CYCLOBENZAPRINE HCL 10 MG PO TABS: 10.0000 mg | ORAL_TABLET | Freq: Three times a day (TID) | ORAL | 0 refills | Status: DC | PRN

## 2022-03-29 NOTE — Assessment & Plan Note (Signed)
On Oxybutynin since Feb 2023 for urinary frequency - will d/c ?Abd X ray: Mildly dilated loops of small bowel in the right abdomen may ?represent ileus or enteritis. Developing partial small bowel ?obstruction is not excluded. ?Re-sch abd CT ?Pain is better ?

## 2022-03-29 NOTE — Assessment & Plan Note (Addendum)
Pain is better ?Flexeril prn ?

## 2022-03-29 NOTE — Assessment & Plan Note (Addendum)
Check CMET ?D/c oxybutynin  ?

## 2022-03-29 NOTE — Telephone Encounter (Signed)
PT calls in regards to their medication. PT did not specify with office on which pharmacy to send the cyclobenzaprine prescription to. PT actually needs this sent to the [facility] [address] instead of the [facility] due to insurance not covering it.  ?

## 2022-03-29 NOTE — Patient Instructions (Signed)
Stop Oxybutynin  ?

## 2022-03-29 NOTE — Assessment & Plan Note (Signed)
Better  

## 2022-03-29 NOTE — Progress Notes (Signed)
? ?Subjective:  ?Patient ID: Joe Hawkins, male    DOB: [birth year]  Age: 61 y.o. MRN: [id number] ? ?CC: No chief complaint on file. ? ? ?HPI ?Espn [facility] presents for dry mouth and ST x 3 days ?F/u on abd pain - better. Abd CT - not done yet ?On Oxybutynin since Feb 2023 for urinary frequency ? ?Outpatient Medications Prior to Visit  ?Medication Sig Dispense Refill  ? acetaminophen (TYLENOL) 650 MG CR tablet Take 1,300 mg by mouth every 8 (eight) hours as needed for pain.    ? levocetirizine (XYZAL) 5 MG tablet Take 1 tablet (5 mg total) by mouth every evening. 90 tablet 3  ? methocarbamol (ROBAXIN-750) 750 MG tablet Take 1 tablet (750 mg total) by mouth 4 (four) times daily. 90 tablet 1  ? Polyethyl Glycol-Propyl Glycol (SYSTANE OP) Place 1 drop into both eyes daily as needed (Irritation).    ? amoxicillin-clavulanate (AUGMENTIN) 875-125 MG tablet Take 1 tablet by mouth 2 (two) times daily. 20 tablet 0  ? cyclobenzaprine (FLEXERIL) 10 MG tablet Take 10 mg by mouth 3 (three) times daily.    ? meloxicam (MOBIC) 15 MG tablet Take 1 tablet (15 mg total) by mouth daily as needed for pain. 30 tablet 0  ? oxybutynin (DITROPAN) 5 MG tablet Take 1 tablet (5 mg total) by mouth 3 (three) times daily. 30 tablet 2  ? EPINEPHrine (EPIPEN) 0.3 mg/0.3 mL DEVI Inject 0.3 mLs (0.3 mg total) into the muscle once. (Patient not taking: Reported on 12/18/2021) 1 Device 0  ? fluticasone (FLONASE) 50 MCG/ACT nasal spray Place 2 sprays into both nostrils daily. (Patient not taking: Reported on 03/12/2022) 16 g 6  ? montelukast (SINGULAIR) 10 MG tablet Take 1 tablet (10 mg total) by mouth at bedtime. (Patient not taking: Reported on 11/06/2021) 90 tablet 3  ? ?No facility-administered medications prior to visit.  ? ? ?ROS: ?Review of Systems  ?Constitutional:  Negative for appetite change, fatigue and unexpected weight change.  ?HENT:  Positive for sore throat. Negative for congestion, nosebleeds, sneezing and trouble swallowing.   ?Eyes:   Negative for itching and visual disturbance.  ?Respiratory:  Negative for cough.   ?Cardiovascular:  Negative for chest pain, palpitations and leg swelling.  ?Gastrointestinal:  Positive for abdominal pain. Negative for abdominal distention, blood in stool, diarrhea and nausea.  ?Genitourinary:  Negative for frequency and hematuria.  ?Musculoskeletal:  Negative for back pain, gait problem, joint swelling and neck pain.  ?Skin:  Negative for rash.  ?Neurological:  Negative for dizziness, tremors, speech difficulty and weakness.  ?Psychiatric/Behavioral:  Negative for agitation, dysphoric mood and sleep disturbance. The patient is not nervous/anxious.   ? ?Objective:  ?BP 100/70 (BP Location: Left Arm, Patient Position: Sitting, Cuff Size: Normal)   Pulse 66   Temp 98 ?F (36.7 ?C) (Oral)   Ht '5\' 9"'$  (1.753 m)   Wt 226 lb (102.5 kg)   SpO2 96%   BMI 33.37 kg/m?  ? ?BP Readings from Last 3 Encounters:  ?03/29/22 100/70  ?03/12/22 (!) 102/58  ?01/11/22 126/74  ? ? ?Wt Readings from Last 3 Encounters:  ?03/29/22 226 lb (102.5 kg)  ?03/12/22 228 lb (103.4 kg)  ?01/11/22 221 lb (100.2 kg)  ? ? ?Physical Exam ?Constitutional:   ?   General: He is not in acute distress. ?   Appearance: He is well-developed. He is obese.  ?   Comments: NAD  ?Eyes:  ?   Conjunctiva/sclera: Conjunctivae normal.  ?  Pupils: Pupils are equal, round, and reactive to light.  ?Neck:  ?   Thyroid: No thyromegaly.  ?   Vascular: No JVD.  ?Cardiovascular:  ?   Rate and Rhythm: Normal rate and regular rhythm.  ?   Heart sounds: Normal heart sounds. No murmur heard. ?  No friction rub. No gallop.  ?Pulmonary:  ?   Effort: Pulmonary effort is normal. No respiratory distress.  ?   Breath sounds: Normal breath sounds. No wheezing or rales.  ?Chest:  ?   Chest wall: No tenderness.  ?Abdominal:  ?   General: Bowel sounds are normal. There is no distension.  ?   Palpations: Abdomen is soft. There is no mass.  ?   Tenderness: There is no abdominal  tenderness. There is no guarding or rebound.  ?Musculoskeletal:     ?   General: No tenderness. Normal range of motion.  ?   Cervical back: Normal range of motion.  ?Lymphadenopathy:  ?   Cervical: No cervical adenopathy.  ?Skin: ?   General: Skin is warm and dry.  ?   Findings: No rash.  ?Neurological:  ?   Mental Status: He is alert and oriented to person, place, and time.  ?   Cranial Nerves: No cranial nerve deficit.  ?   Motor: No abnormal muscle tone.  ?   Coordination: Coordination normal.  ?   Gait: Gait normal.  ?   Deep Tendon Reflexes: Reflexes are normal and symmetric.  ?Psychiatric:     ?   Behavior: Behavior normal.     ?   Thought Content: Thought content normal.     ?   Judgment: Judgment normal.  ? ? ?Lab Results  ?Component Value Date  ? WBC 10.5 03/12/2022  ? HGB 14.8 03/12/2022  ? HCT 45.2 03/12/2022  ? PLT 241.0 03/12/2022  ? GLUCOSE 92 01/11/2022  ? CHOL 195 05/05/2021  ? TRIG 154.0 (H) 05/05/2021  ? HDL 47.60 05/05/2021  ? LDLCALC 117 (H) 05/05/2021  ? ALT 49 01/11/2022  ? AST 38 (H) 01/11/2022  ? NA 139 01/11/2022  ? K 4.1 01/11/2022  ? CL 101 01/11/2022  ? CREATININE 0.97 01/11/2022  ? BUN 14 01/11/2022  ? CO2 32 01/11/2022  ? TSH 1.30 05/05/2021  ? PSA 0.54 01/11/2022  ? HGBA1C 5.5 01/01/2017  ? ? ?DG Lumbar Spine 2-3 Views ? ?Result Date: 12/26/2021 ?CLINICAL DATA:  L4-L5 TLIF EXAM: LUMBAR SPINE - 2-3 VIEW COMPARISON:  None. FINDINGS: Intraoperative images during L4-L5 posterior and interbody fusion. Intact hardware without evidence of loosening. IMPRESSION: Intraoperative images during L4-L5 posterior and interbody fusion. No evidence of immediate hardware complication. Electronically Signed   By: Maurine Simmering M.D.   On: 12/26/2021 11:55  ? ?DG C-Arm 1-60 Min-No Report ? ?Result Date: 12/26/2021 ?Fluoroscopy was utilized by the requesting physician.  No radiographic interpretation.  ? ?DG C-Arm 1-60 Min-No Report ? ?Result Date: 12/26/2021 ?Fluoroscopy was utilized by the requesting physician.   No radiographic interpretation.  ? ?DG C-Arm 1-60 Min-No Report ? ?Result Date: 12/26/2021 ?Fluoroscopy was utilized by the requesting physician.  No radiographic interpretation.  ? ? ?Assessment & Plan:  ? ?Problem List Items Addressed This Visit   ? ? LOW BACK PAIN  ?  Pain is better ?Flexeril prn ? ?  ?  ? Relevant Medications  ? cyclobenzaprine (FLEXERIL) 10 MG tablet  ? Constipation  ?  Better ? ?  ?  ? LLQ abdominal pain  ?  On Oxybutynin since Feb 2023 for urinary frequency - will d/c ?Abd X ray: Mildly dilated loops of small bowel in the right abdomen may ?represent ileus or enteritis. Developing partial small bowel ?obstruction is not excluded. ?Re-sch abd CT ?Pain is better ?  ?  ? Dry mouth  ?  Check CMET ?D/c oxybutynin  ? ?  ?  ? ?Other Visit Diagnoses   ? ? Other partial intestinal obstruction (HCC)    -  Primary  ? Relevant Orders  ? Comprehensive metabolic panel  ? ?  ?  ? ? ?Meds ordered this encounter  ?Medications  ? cyclobenzaprine (FLEXERIL) 10 MG tablet  ?  Sig: Take 1 tablet (10 mg total) by mouth 3 (three) times daily as needed for muscle spasms.  ?  Dispense:  90 tablet  ?  Refill:  0  ?  ? ? ?Follow-up: Return in about 4 weeks (around 04/26/2022) for a follow-up visit. ? ?Walker Kehr, MD ?

## 2022-03-30 ENCOUNTER — Other Ambulatory Visit: Payer: Self-pay

## 2022-03-30 MED ORDER — CYCLOBENZAPRINE HCL 10 MG PO TABS: 10.0000 mg | ORAL_TABLET | Freq: Three times a day (TID) | ORAL | 0 refills | Status: DC | PRN

## 2022-04-03 ENCOUNTER — Other Ambulatory Visit: Payer: Self-pay | Admitting: Internal Medicine

## 2022-04-19 ENCOUNTER — Ambulatory Visit (INDEPENDENT_AMBULATORY_CARE_PROVIDER_SITE_OTHER)
Admission: RE | Admit: 2022-04-19 | Discharge: 2022-04-19 | Disposition: A | Discharge status: Home / Self Care | Payer: 59 | Source: Ambulatory Visit | Attending: Internal Medicine | Admitting: Internal Medicine

## 2022-04-19 DIAGNOSIS — I700 Atherosclerosis of aorta: Secondary | ICD-10-CM | POA: Diagnosis not present

## 2022-04-19 DIAGNOSIS — R1032 Left lower quadrant pain: Secondary | ICD-10-CM | POA: Diagnosis not present

## 2022-04-19 DIAGNOSIS — K5669 Other partial intestinal obstruction: Secondary | ICD-10-CM | POA: Diagnosis not present

## 2022-04-19 DIAGNOSIS — K760 Fatty (change of) liver, not elsewhere classified: Secondary | ICD-10-CM | POA: Diagnosis not present

## 2022-04-19 DIAGNOSIS — K7689 Other specified diseases of liver: Secondary | ICD-10-CM | POA: Diagnosis not present

## 2022-04-19 DIAGNOSIS — K6389 Other specified diseases of intestine: Secondary | ICD-10-CM | POA: Diagnosis not present

## 2022-04-19 MED ORDER — IOHEXOL 300 MG/ML  SOLN
100.0000 mL | Freq: Once | INTRAMUSCULAR | Status: AC | PRN
  Administered 2022-04-19: 100 mL via INTRAVENOUS

## 2022-04-23 ENCOUNTER — Telehealth: Payer: Self-pay | Admitting: *Deleted

## 2022-04-23 ENCOUNTER — Other Ambulatory Visit: Payer: Self-pay | Admitting: Internal Medicine

## 2022-04-23 ENCOUNTER — Telehealth: Payer: Self-pay | Admitting: Gastroenterology

## 2022-04-23 DIAGNOSIS — K6389 Other specified diseases of intestine: Secondary | ICD-10-CM

## 2022-04-23 DIAGNOSIS — R1032 Left lower quadrant pain: Secondary | ICD-10-CM

## 2022-04-23 NOTE — Telephone Encounter (Signed)
Dr. Fuller Plan,   We received an urgent referral from Dr. Alain Marion for a colonic mass seen on CT scan.  Patient last saw [facility] 07/26/2021 (records in [facility]).  Patient is stating he doesn't want to return to [facility] and wants to switch care to [facility].  Please advise scheduling?  Thanks

## 2022-04-23 NOTE — Telephone Encounter (Signed)
Request received to transfer GI care from outside practice to [facility].  We appreciate the interest in our practice, however at this time due to high demand from patients without established GI providers we cannot accommodate this transfer.  Ability to accommodate future transfer requests may change over time and the patient can contact us again in 6-12 months if still interested in being seen at [facility].      °

## 2022-04-23 NOTE — Telephone Encounter (Signed)
Dr. Loletha Carrow is DOD this afternoon. I was DOD this morning.

## 2022-04-23 NOTE — Progress Notes (Signed)
GI ref

## 2022-04-23 NOTE — Telephone Encounter (Signed)
CRITICAL CT- REPORT IS IN Epic  RESULTS- wall thickening of the distal ileum/proximal colon at the ileocecal valve with a few prominent RIGHT mesenteric/pericolonic lymph nodes. This is nonspecific but suspicious for malignancy with possible adjacent lymph node spread...Joe Hawkins

## 2022-04-23 NOTE — Telephone Encounter (Signed)
I Roxanne Mins at Dr. Judeen Hammans office to let them know Dr. Silvio Pate recommendations.

## 2022-04-24 NOTE — Telephone Encounter (Signed)
It has been addressed. Joe Hawkins is aware. Thx

## 2022-04-24 NOTE — Telephone Encounter (Signed)
PT HAS APPT ON 04/25/22.Marland Kitchen/LMB

## 2022-04-25 ENCOUNTER — Ambulatory Visit: Payer: 59 | Admitting: Internal Medicine

## 2022-05-08 ENCOUNTER — Ambulatory Visit (INDEPENDENT_AMBULATORY_CARE_PROVIDER_SITE_OTHER): Payer: 59 | Admitting: Internal Medicine

## 2022-05-08 ENCOUNTER — Encounter: Payer: Self-pay | Admitting: Internal Medicine

## 2022-05-08 VITALS — BP 114/66 | HR 96 | Temp 98.1°F | Resp 20 | Ht 69.0 in | Wt 228.0 lb

## 2022-05-08 DIAGNOSIS — R1032 Left lower quadrant pain: Secondary | ICD-10-CM

## 2022-05-08 DIAGNOSIS — K6389 Other specified diseases of intestine: Secondary | ICD-10-CM | POA: Diagnosis not present

## 2022-05-08 NOTE — Assessment & Plan Note (Addendum)
We discussed a colonic mass seen on CT scan and his last colonoscopy.Will ref to see Dr Melene Plan Mistrot  Procedure: Colonoscopy 07/26/21:  Preprocedure Indication: Encounter for screening colonoscopy   Postprocedure Impression: Sessile adenomatous tissue near appendiceal  orifice. Patch of erythematous mucosa in transverse colon.  Poor left sided bowel prep.   Recommendations:  Repeat colonoscopy in 1 year because of poor left sided prep.  Needs 2 day bowel prep and instructions to avoid fibrous foods for 5 days  prior to colonoscopy.   Recent abd CT: Apparent circumferential wall thickening of the distal ileum/proximal colon at the ileocecal valve with a few prominent RIGHT mesenteric/pericolonic lymph nodes. This is nonspecific but suspicious for malignancy with possible adjacent lymph node spread. No adjacent inflammation identified. Recommend GI consultation/direct inspection.

## 2022-05-08 NOTE — Assessment & Plan Note (Addendum)
We discussed a colonic mass seen on CT scan and his last colonoscopy. Will ref to see Dr Kathlyn Sacramento  Procedure: Colonoscopy 07/26/21:  Preprocedure Indication: Encounter for screening colonoscopy   Postprocedure Impression: Sessile adenomatous tissue near appendiceal  orifice. Patch of erythematous mucosa in transverse colon.  Poor left sided bowel prep.   Recommendations:  Repeat colonoscopy in 1 year because of poor left sided prep.  Needs 2 day bowel prep and instructions to avoid fibrous foods for 5 days  prior to colonoscopy.   Recent abd CT: Apparent circumferential wall thickening of the distal ileum/proximal colon at the ileocecal valve with a few prominent RIGHT mesenteric/pericolonic lymph nodes. This is nonspecific but suspicious for malignancy with possible adjacent lymph node spread. No adjacent inflammation identified. Recommend GI consultation/direct inspection.

## 2022-05-08 NOTE — Progress Notes (Signed)
Subjective:  Patient ID: Doral Ventrella, male    DOB: 11-02-1961  Age: 61 y.o. MRN: [id number]  CC: Follow-up (Still experiencing abdomen, able to eat, having BM )   HPI Rock Mandich presents for LLQ abd pain and a colonic mass on CT. C/o LLQ abd pain 3/10 - not taking meds for it.  Outpatient Medications Prior to Visit  Medication Sig Dispense Refill   cyclobenzaprine (FLEXERIL) 10 MG tablet Take 1 tablet (10 mg total) by mouth 3 (three) times daily as needed for muscle spasms. 90 tablet 0   fluticasone (FLONASE) 50 MCG/ACT nasal spray Place 2 sprays into both nostrils daily. 16 g 6   levocetirizine (XYZAL) 5 MG tablet Take 1 tablet (5 mg total) by mouth every evening. 90 tablet 3   meloxicam (MOBIC) 15 MG tablet TAKE 1 TABLET BY MOUTH EVERY DAY AS NEEDED FOR PAIN 30 tablet 0   montelukast (SINGULAIR) 10 MG tablet Take 1 tablet (10 mg total) by mouth at bedtime. 90 tablet 3   Polyethyl Glycol-Propyl Glycol (SYSTANE OP) Place 1 drop into both eyes daily as needed (Irritation).     acetaminophen (TYLENOL) 650 MG CR tablet Take 1,300 mg by mouth every 8 (eight) hours as needed for pain. (Patient not taking: Reported on 05/08/2022)     EPINEPHrine (EPIPEN) 0.3 mg/0.3 mL DEVI Inject 0.3 mLs (0.3 mg total) into the muscle once. (Patient not taking: Reported on 12/18/2021) 1 Device 0   methocarbamol (ROBAXIN-750) 750 MG tablet Take 1 tablet (750 mg total) by mouth 4 (four) times daily. (Patient not taking: Reported on 05/08/2022) 90 tablet 1   No facility-administered medications prior to visit.    ROS: Review of Systems  Constitutional:  Negative for appetite change, fatigue and unexpected weight change.  HENT:  Negative for congestion, nosebleeds, sneezing, sore throat and trouble swallowing.   Eyes:  Negative for itching and visual disturbance.  Respiratory:  Negative for cough.   Cardiovascular:  Negative for chest pain, palpitations and leg swelling.  Gastrointestinal:  Positive for abdominal  pain. Negative for abdominal distention, blood in stool, diarrhea and nausea.  Genitourinary:  Negative for frequency and hematuria.  Musculoskeletal:  Negative for back pain, gait problem, joint swelling and neck pain.  Skin:  Negative for rash.  Neurological:  Negative for dizziness, tremors, speech difficulty and weakness.  Psychiatric/Behavioral:  Negative for agitation, decreased concentration, dysphoric mood and sleep disturbance. The patient is not nervous/anxious.     Objective:  BP 114/66 (BP Location: Left Arm, Patient Position: Sitting, Cuff Size: Large)   Pulse 96   Temp 98.1 F (36.7 C)   Resp 20   Ht '5\' 9"'$  (1.753 m)   Wt 228 lb (103.4 kg)   SpO2 96%   BMI 33.67 kg/m   BP Readings from Last 3 Encounters:  05/08/22 114/66  03/29/22 100/70  03/12/22 (!) 102/58    Wt Readings from Last 3 Encounters:  05/08/22 228 lb (103.4 kg)  03/29/22 226 lb (102.5 kg)  03/12/22 228 lb (103.4 kg)    Physical Exam Constitutional:      General: He is not in acute distress.    Appearance: He is well-developed. He is obese.     Comments: NAD  Eyes:     Conjunctiva/sclera: Conjunctivae normal.     Pupils: Pupils are equal, round, and reactive to light.  Neck:     Thyroid: No thyromegaly.     Vascular: No JVD.  Cardiovascular:  Rate and Rhythm: Normal rate and regular rhythm.     Heart sounds: Normal heart sounds. No murmur heard.    No friction rub. No gallop.  Pulmonary:     Effort: Pulmonary effort is normal. No respiratory distress.     Breath sounds: Normal breath sounds. No wheezing or rales.  Chest:     Chest wall: No tenderness.  Abdominal:     General: Bowel sounds are normal. There is no distension.     Palpations: Abdomen is soft. There is no mass.     Tenderness: There is no abdominal tenderness. There is no guarding or rebound.  Musculoskeletal:        General: No tenderness. Normal range of motion.     Cervical back: Normal range of motion.   Lymphadenopathy:     Cervical: No cervical adenopathy.  Skin:    General: Skin is warm and dry.     Findings: No rash.  Neurological:     Mental Status: He is alert and oriented to person, place, and time.     Cranial Nerves: No cranial nerve deficit.     Motor: No abnormal muscle tone.     Coordination: Coordination normal.     Gait: Gait normal.     Deep Tendon Reflexes: Reflexes are normal and symmetric.  Psychiatric:        Behavior: Behavior normal.        Thought Content: Thought content normal.        Judgment: Judgment normal.     Lab Results  Component Value Date   WBC 10.5 03/12/2022   HGB 14.8 03/12/2022   HCT 45.2 03/12/2022   PLT 241.0 03/12/2022   GLUCOSE 92 01/11/2022   CHOL 195 05/05/2021   TRIG 154.0 (H) 05/05/2021   HDL 47.60 05/05/2021   LDLCALC 117 (H) 05/05/2021   ALT 49 01/11/2022   AST 38 (H) 01/11/2022   NA 139 01/11/2022   K 4.1 01/11/2022   CL 101 01/11/2022   CREATININE 0.97 01/11/2022   BUN 14 01/11/2022   CO2 32 01/11/2022   TSH 1.30 05/05/2021   PSA 0.54 01/11/2022   HGBA1C 5.5 01/01/2017    CT Abdomen Pelvis W Contrast  Result Date: 04/21/2022 CLINICAL DATA:  61 year old male with abdominal and pelvic pain for 3 months. EXAM: CT ABDOMEN AND PELVIS WITH CONTRAST TECHNIQUE: Multidetector CT imaging of the abdomen and pelvis was performed using the standard protocol following bolus administration of intravenous contrast. RADIATION DOSE REDUCTION: This exam was performed according to the departmental dose-optimization program which includes automated exposure control, adjustment of the mA and/or kV according to patient size and/or use of iterative reconstruction technique. CONTRAST:  161m OMNIPAQUE IOHEXOL 300 MG/ML  SOLN COMPARISON:  None Available. FINDINGS: Lower chest: No acute or suspicious abnormalities noted. Calcified RIGHT hilar lymph node and tiny calcified RIGHT LOWER lobe granuloma noted. Hepatobiliary: Hepatic steatosis noted. No  suspicious focal hepatic abnormalities are present. A 4 mm RIGHT hepatic cyst is identified. The gallbladder is unremarkable. There is no evidence of intrahepatic or extrahepatic biliary dilatation. Pancreas: Unremarkable Spleen: Unremarkable Adrenals/Urinary Tract: The kidneys, adrenal glands and bladder are unremarkable except for LEFT LOWER pole renal scarring. Stomach/Bowel: There is apparent circumferential wall thickening of the distal ileum/proximal colon at the ileocecal valve, more than expected with a contraction and there is no evidence of adjacent inflammation. There is no evidence of bowel obstruction. No other definite areas of bowel wall thickening noted. A moderate to large  amount of stool within the ascending and transverse colon identified. The appendix is normal. Vascular/Lymphatic: There are a few prominent RIGHT mesenteric/pericolonic lymph nodes (series 2: Images 53-57). Aortic atherosclerosis. No enlarged abdominal or pelvic lymph nodes. Reproductive: No significant abnormality Other: No ascites, focal collection or pneumoperitoneum identified. Musculoskeletal: No acute or suspicious bony abnormalities are identified. Posterior/interbody fusion changes at L4-5 noted. IMPRESSION: 1. Apparent circumferential wall thickening of the distal ileum/proximal colon at the ileocecal valve with a few prominent RIGHT mesenteric/pericolonic lymph nodes. This is nonspecific but suspicious for malignancy with possible adjacent lymph node spread. No adjacent inflammation identified. Recommend GI consultation/direct inspection. 2. Hepatic steatosis. 3. Aortic Atherosclerosis (ICD10-I70.0). Electronically Signed   By: Margarette Canada M.D.   On: 04/21/2022 11:46    Assessment & Plan:   Problem List Items Addressed This Visit     Colonic mass - Primary    We discussed a colonic mass seen on CT scan and his last colonoscopy. Will ref to see Dr Kathlyn Sacramento  Procedure: Colonoscopy 07/26/21:  Preprocedure  Indication: Encounter for screening colonoscopy   Postprocedure Impression: Sessile adenomatous tissue near appendiceal  orifice. Patch of erythematous mucosa in transverse colon.  Poor left sided bowel prep.   Recommendations:  Repeat colonoscopy in 1 year because of poor left sided prep.  Needs 2 day bowel prep and instructions to avoid fibrous foods for 5 days  prior to colonoscopy.   Recent abd CT: Apparent circumferential wall thickening of the distal ileum/proximal colon at the ileocecal valve with a few prominent RIGHT mesenteric/pericolonic lymph nodes. This is nonspecific but suspicious for malignancy with possible adjacent lymph node spread. No adjacent inflammation identified. Recommend GI consultation/direct inspection.       Relevant Orders   Ambulatory referral to Gastroenterology   LLQ abdominal pain    We discussed a colonic mass seen on CT scan and his last colonoscopy.Will ref to see Dr Melene Plan Mistrot  Procedure: Colonoscopy 07/26/21:  Preprocedure Indication: Encounter for screening colonoscopy   Postprocedure Impression: Sessile adenomatous tissue near appendiceal  orifice. Patch of erythematous mucosa in transverse colon.  Poor left sided bowel prep.   Recommendations:  Repeat colonoscopy in 1 year because of poor left sided prep.  Needs 2 day bowel prep and instructions to avoid fibrous foods for 5 days  prior to colonoscopy.   Recent abd CT: Apparent circumferential wall thickening of the distal ileum/proximal colon at the ileocecal valve with a few prominent RIGHT mesenteric/pericolonic lymph nodes. This is nonspecific but suspicious for malignancy with possible adjacent lymph node spread. No adjacent inflammation identified. Recommend GI consultation/direct inspection.       Relevant Orders   Ambulatory referral to Gastroenterology      No orders of the defined types were placed in this encounter.     Follow-up: Return in about 3 months (around  08/08/2022) for a follow-up visit.  Walker Kehr, MD

## 2022-05-17 ENCOUNTER — Telehealth: Payer: Self-pay | Admitting: Internal Medicine

## 2022-05-17 NOTE — Telephone Encounter (Signed)
Since it's an insurance reason we'll accept the transfer of care. I will see which physician or APP has the soonest availability. Thanks.

## 2022-05-17 NOTE — Telephone Encounter (Signed)
A representative with [facility] calls today in regards to a recent referral received for PT. They had to cancel this appointment due to them not accepting PT's [facility].  They did inform me that Dr.Mistrot looked over PT's chart (since he is an established PT there as well) and states that PT might just need a colonoscopy. Suggested we try back with [facility] or see if [facility] has a GI office that would take the insurance.

## 2022-05-17 NOTE — Telephone Encounter (Signed)
Patient is scheduled for 7/26 at 11:30 am with Jaclyn Shaggy, NP. Interpreter service line is currently down, will contact patient once it is available.

## 2022-05-17 NOTE — Telephone Encounter (Signed)
Hi Malcolm, Can you or one of your guys see Krithik, please?  Tzion had  to go to [facility] for his recent colonoscopy (Dr Samule Ohm) in 2022 due to his insurance change and now they can't see him due to another insurance switch as I understand. He is a super nice fellow with a possible colon mass on CT. Hope you're well. Thank you, AP

## 2022-05-17 NOTE — Telephone Encounter (Signed)
Yes please schedule the earliest APP appt

## 2022-05-18 ENCOUNTER — Telehealth: Payer: Self-pay | Admitting: Internal Medicine

## 2022-05-18 NOTE — Telephone Encounter (Signed)
Pt cousin Lucious Groves called is requesting a callback to the pt. She stated the pt was referred to [facility] but his insurance is not accepted there. He would like a referral to a facility that accepts his insurance.    Please advise  [phone]

## 2022-05-20 NOTE — Telephone Encounter (Signed)
Thank you :)

## 2022-05-21 NOTE — Telephone Encounter (Signed)
Spoke with patient with interpreter services. He is aware of follow up scheduled with Jaclyn Shaggy, NP.

## 2022-06-01 DIAGNOSIS — M5416 Radiculopathy, lumbar region: Secondary | ICD-10-CM | POA: Diagnosis not present

## 2022-06-13 ENCOUNTER — Ambulatory Visit: Payer: 59 | Admitting: Physician Assistant

## 2022-06-13 ENCOUNTER — Encounter: Payer: Self-pay | Admitting: Nurse Practitioner

## 2022-06-13 ENCOUNTER — Ambulatory Visit (INDEPENDENT_AMBULATORY_CARE_PROVIDER_SITE_OTHER): Payer: 59 | Admitting: Nurse Practitioner

## 2022-06-13 ENCOUNTER — Other Ambulatory Visit (INDEPENDENT_AMBULATORY_CARE_PROVIDER_SITE_OTHER): Payer: 59

## 2022-06-13 VITALS — BP 110/60 | HR 64 | Ht 68.5 in | Wt 230.4 lb

## 2022-06-13 DIAGNOSIS — Z86010 Personal history of colonic polyps: Secondary | ICD-10-CM

## 2022-06-13 DIAGNOSIS — R933 Abnormal findings on diagnostic imaging of other parts of digestive tract: Secondary | ICD-10-CM

## 2022-06-13 LAB — CBC WITH DIFFERENTIAL/PLATELET
Basophils Absolute: 0.1 10*3/uL (ref 0.0–0.1)
Basophils Relative: 1.1 % (ref 0.0–3.0)
Eosinophils Absolute: 0.4 10*3/uL (ref 0.0–0.7)
Eosinophils Relative: 4 % (ref 0.0–5.0)
HCT: 45.8 % (ref 39.0–52.0)
Hemoglobin: 14.9 g/dL (ref 13.0–17.0)
Lymphocytes Relative: 29.3 % (ref 12.0–46.0)
Lymphs Abs: 2.9 10*3/uL (ref 0.7–4.0)
MCHC: 32.5 g/dL (ref 30.0–36.0)
MCV: 79.3 fl (ref 78.0–100.0)
Monocytes Absolute: 1.1 10*3/uL — ABNORMAL HIGH (ref 0.1–1.0)
Monocytes Relative: 11.5 % (ref 3.0–12.0)
Neutro Abs: 5.4 10*3/uL (ref 1.4–7.7)
Neutrophils Relative %: 54.1 % (ref 43.0–77.0)
Platelets: 232 10*3/uL (ref 150.0–400.0)
RBC: 5.78 Mil/uL (ref 4.22–5.81)
RDW: 13.6 % (ref 11.5–15.5)
WBC: 9.9 10*3/uL (ref 4.0–10.5)

## 2022-06-13 LAB — COMPREHENSIVE METABOLIC PANEL
ALT: 52 U/L (ref 0–53)
AST: 45 U/L — ABNORMAL HIGH (ref 0–37)
Albumin: 3.9 g/dL (ref 3.5–5.2)
Alkaline Phosphatase: 60 U/L (ref 39–117)
BUN: 9 mg/dL (ref 6–23)
CO2: 31 mEq/L (ref 19–32)
Calcium: 9.1 mg/dL (ref 8.4–10.5)
Chloride: 101 mEq/L (ref 96–112)
Creatinine, Ser: 1 mg/dL (ref 0.40–1.50)
GFR: 80.16 mL/min (ref >60.00)
Glucose, Bld: 109 mg/dL — ABNORMAL HIGH (ref 70–99)
Potassium: 4.2 mEq/L (ref 3.5–5.1)
Sodium: 139 mEq/L (ref 135–145)
Total Bilirubin: 0.7 mg/dL (ref 0.2–1.2)
Total Protein: 7.5 g/dL (ref 6.0–8.3)

## 2022-06-13 LAB — HIGH SENSITIVITY CRP: CRP, High Sensitivity: 10.5 mg/L — ABNORMAL HIGH (ref 0.000–5.000)

## 2022-06-13 MED ORDER — PLENVU 140 G PO SOLR: 140.0000 g | ORAL | 0 refills | Status: DC

## 2022-06-13 NOTE — Progress Notes (Signed)
Agree with assessment and plan as outlined.  

## 2022-06-13 NOTE — Patient Instructions (Addendum)
Take Miralax 1 capful mixed in 8 ounces of water at bed time for constipation as tolerated.  Your provider has requested that you go to the basement level for lab work before leaving today. Press "B" on the elevator. The lab is located at the first door on the left as you exit the elevator.   If you are age 63 or older, your body mass index should be between 23-30. Your Body mass index is 34.52 kg/m. If this is out of the aforementioned range listed, please consider follow up with your Primary Care Provider.  If you are age 31 or younger, your body mass index should be between 19-25. Your Body mass index is 34.52 kg/m. If this is out of the aformentioned range listed, please consider follow up with your Primary Care Provider.   ________________________________________________________  The  GI providers would like to encourage you to use [facility] to communicate with providers for non-urgent requests or questions.  Due to long hold times on the telephone, sending your provider a message by [facility] may be a faster and more efficient way to get a response.  Please allow 48 business hours for a response.  Please remember that this is for non-urgent requests.  _______________________________________________________  Dennis Bast have been scheduled for a colonoscopy. Please follow written instructions given to you at your visit today.  Please pick up your prep supplies at the pharmacy within the next 1-3 days. If you use inhalers (even only as needed), please bring them with you on the day of your procedure.   It was a pleasure to see you today!  Thank you for trusting me with your gastrointestinal care!

## 2022-06-13 NOTE — Progress Notes (Signed)
06/13/2022 Joe Hawkins [id number] 1961/01/26   CHIEF COMPLAINT: Schedule colonoscopy  HISTORY OF PRESENT ILLNESS: Joe Hawkins is a 61 year old male with a past medical history of lower back pain, allergic rhinitis, GERD and a cecal polyp.  He speaks Khmer therefore he presents with a [place] interpreter who was present to facilitate communication throughout today's consult.  He presents to our office today as referred by Dr. Alain Hawkins for further evaluation regarding a possible colonic mass identified per CT. he reported having lower abdominal pain for the past few months and he underwent a CTAP with contrast 04/19/2022 which identified circumferential wall thickening to the distal ileum/proximal colon at the ileocecal valve with a few prominent right mesenteric/pericolonic lymph nodes, malignancy could not be excluded.  A moderate to large amount of stool was present in the ascending and transverse colon.  He complains of having central lower abdominal pain which is noticeable after he sits for prolonged period of time then stands up.  He also experiences this pain when he lays on his right or left side at nighttime.  He denies having any fever, sweats or chills.  No weight loss.  He is passing a hard to formed stool once daily.  No rectal bleeding or black stools.  He reported undergoing an initial colonoscopy approximately 5 years ago by Dr. Mitchell Hawkins at [facility] which resulted in a poor prep.  He underwent a colonoscopy by Dr. Samule Hawkins with [facility] 07/26/2021 which identified a 10 mm sessile polyp removed from the cecum with localized erythematous mucosa in the transverse colon.  Biopsies of the cecum showed benign colonic mucosa and biopsies of the transverse colon also showed benign colonic mucosa with focal lymphoid aggregates.  He was advised to repeat a colonoscopy 1 year because of poor left-sided prep.  The patient's health insurance recently changed and his new health insurance did not  participate with Dr. Samule Hawkins therefore he was referred to [facility] gastroenterology. No family history of colon cancer or IBD.  He has infrequent heartburn.  No dysphagia.  No upper abdominal pain.  He infrequently takes Meloxicam 15 mg as needed.  He takes Tylenol as needed for aches and pains.  No urinary difficulties.  CT ABDOMEN AND PELVIS WITH CONTRAST 04/19/2022:   TECHNIQUE: Multidetector CT imaging of the abdomen and pelvis was performed using the standard protocol following bolus administration of intravenous contrast.   RADIATION DOSE REDUCTION: This exam was performed according to the departmental dose-optimization program which includes automated exposure control, adjustment of the mA and/or kV according to patient size and/or use of iterative reconstruction technique.   CONTRAST:  158m OMNIPAQUE IOHEXOL 300 MG/ML  SOLN   COMPARISON:  None Available.   FINDINGS: Lower chest: No acute or suspicious abnormalities noted. Calcified RIGHT hilar lymph node and tiny calcified RIGHT LOWER lobe granuloma noted.   Hepatobiliary: Hepatic steatosis noted. No suspicious focal hepatic abnormalities are present. A 4 mm RIGHT hepatic cyst is identified. The gallbladder is unremarkable. There is no evidence of intrahepatic or extrahepatic biliary dilatation.   Pancreas: Unremarkable   Spleen: Unremarkable   Adrenals/Urinary Tract: The kidneys, adrenal glands and bladder are unremarkable except for LEFT LOWER pole renal scarring.   Stomach/Bowel: There is apparent circumferential wall thickening of the distal ileum/proximal colon at the ileocecal valve, more than expected with a contraction and there is no evidence of adjacent inflammation. There is no evidence of bowel obstruction. No other definite areas of bowel wall  thickening noted. A moderate to large amount of stool within the ascending and transverse colon identified.   The appendix is normal.   Vascular/Lymphatic: There  are a few prominent RIGHT mesenteric/pericolonic lymph nodes (series 2: Images 53-57). Aortic atherosclerosis. No enlarged abdominal or pelvic lymph nodes.   Reproductive: No significant abnormality   Other: No ascites, focal collection or pneumoperitoneum identified.   Musculoskeletal: No acute or suspicious bony abnormalities are identified. Posterior/interbody fusion changes at L4-5 noted.   IMPRESSION: 1. Apparent circumferential wall thickening of the distal ileum/proximal colon at the ileocecal valve with a few prominent RIGHT mesenteric/pericolonic lymph nodes. This is nonspecific but suspicious for malignancy with possible adjacent lymph node spread. No adjacent inflammation identified. Recommend GI consultation/direct inspection. 2. Hepatic steatosis. 3. Aortic Atherosclerosis (ICD10-I70.0).       Latest Ref Rng & Units 03/12/2022   10:42 AM 01/11/2022   11:33 AM 12/22/2021   11:46 AM  CBC  WBC 4.0 - 10.5 K/uL 10.5  9.8  10.6   Hemoglobin 13.0 - 17.0 g/dL 14.8  13.7  14.4   Hematocrit 39.0 - 52.0 % 45.2  42.4  46.4   Platelets 150.0 - 400.0 K/uL 241.0  329.0  220        Latest Ref Rng & Units 01/11/2022   11:33 AM 05/05/2021   11:01 AM 12/25/2016    4:46 PM  CMP  Glucose 70 - 99 mg/dL 92  103    BUN 6 - 23 mg/dL 14  16    Creatinine 0.40 - 1.50 mg/dL 0.97  1.01    Sodium 135 - 145 mEq/L 139  141    Potassium 3.5 - 5.1 mEq/L 4.1  3.9    Chloride 96 - 112 mEq/L 101  102    CO2 19 - 32 mEq/L 32  28    Calcium 8.4 - 10.5 mg/dL 9.6  9.5    Total Protein 6.0 - 8.3 g/dL 7.6  7.4  7.4   Total Bilirubin 0.2 - 1.2 mg/dL 0.4  0.7  0.5   Alkaline Phos 39 - 117 U/L 74  57  70   AST 0 - 37 U/L 38  35  62   ALT 0 - 53 U/L 49  41  57      Colonoscopy by Dr. Samule Hawkins 07/26/2021: Findings: One sessile, adenomatous-appearing polyp measuring 10 mm or larger in the  cecum; performed partial removal by cold forceps biopsy Mild, localized erythematous mucosa in the transverse colon;  performed  cold forceps biopsy  Biopsy results: 1.  Cecum, ascending colon, biopsies: Fragments of benign colonic mucosa showing focal lymphoid aggregates. 2.  Colon TD, biopsies: Fragments of benign colonic mucosa showing focal lymphoid aggregates Recommendations: Repeat colonoscopy in 1 year because of poor left sided prep. Needs 2 day bowel prep and instructions to avoid fibrous foods for 5 days  prior to colonoscopy.   Past Medical History:  Diagnosis Date   Allergic rhinitis    GERD (gastroesophageal reflux disease)    Low back pain    Sinusitis    Dr Constance Holster   Tinnitus 11/19/2009   Past Surgical History:  Procedure Laterality Date   BACK SURGERY  11/19/2005   EYE SURGERY     NASAL SINUS SURGERY  11/19/2006   TRANSFORAMINAL LUMBAR INTERBODY FUSION W/ MIS 1 LEVEL Left 12/26/2021   Procedure: Minimally Invasive Transforaminal Lumbar Interbody Fusion, Left Lumbar Four-Five45;  Surgeon: Vallarie Mare, MD;  Location: [facility];  Service: Neurosurgery;  Laterality: Left;  Minimally Invasive Transforaminal Lumbar Interbody Fusion, Left Lumbar Four-Five45   Social History: He is originally from [place].  He has 3 sons and 1 daughter.  He drinks alcoholic beverages 1 or 2 months. Nonsmoker. 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 Jewry repair.   Family History: No known family history of esophageal, gastric or colon cancer.   Allergies  Allergen Reactions   Shellfish Allergy Itching    Crab, lobsters and shrimp.       Outpatient Encounter Medications as of 06/13/2022  Medication Sig   acetaminophen (TYLENOL) 650 MG CR tablet Take 1,300 mg by mouth every 8 (eight) hours as needed for pain. (Patient not taking: Reported on 05/08/2022)   cyclobenzaprine (FLEXERIL) 10 MG tablet Take 1 tablet (10 mg total) by mouth 3 (three) times daily as needed for muscle spasms.   EPINEPHrine (EPIPEN) 0.3 mg/0.3 mL DEVI Inject 0.3 mLs (0.3 mg total) into the muscle once. (Patient not taking: Reported on 12/18/2021)    fluticasone (FLONASE) 50 MCG/ACT nasal spray Place 2 sprays into both nostrils daily.   levocetirizine (XYZAL) 5 MG tablet Take 1 tablet (5 mg total) by mouth every evening.   meloxicam (MOBIC) 15 MG tablet TAKE 1 TABLET BY MOUTH EVERY DAY AS NEEDED FOR PAIN   methocarbamol (ROBAXIN-750) 750 MG tablet Take 1 tablet (750 mg total) by mouth 4 (four) times daily. (Patient not taking: Reported on 05/08/2022)   montelukast (SINGULAIR) 10 MG tablet Take 1 tablet (10 mg total) by mouth at bedtime.   Polyethyl Glycol-Propyl Glycol (SYSTANE OP) Place 1 drop into both eyes daily as needed (Irritation).   No facility-administered encounter medications on file as of 06/13/2022.    REVIEW OF SYSTEMS:  Gen: Denies fever, sweats or chills. No weight loss.  CV: Denies chest pain, palpitations or edema. Resp: Denies cough, shortness of breath of hemoptysis.  GI: See HPI  GU : Denies urinary burning, blood in urine, increased urinary frequency or incontinence. MS: Denies joint pain, muscles aches or weakness. Derm: Denies rash, itchiness, skin lesions or unhealing ulcers. Psych: Denies depression, anxiety, memory loss, suicidal ideation and confusion. Heme: Denies bruising, easy bleeding. Neuro:  Denies headaches, dizziness or paresthesias. Endo:  Denies any problems with DM, thyroid or adrenal function.  PHYSICAL EXAM: BP 110/60 (BP Location: Left Arm, Patient Position: Sitting, Cuff Size: Normal)   Pulse 64 Comment: irregular  Ht 5' 8.5" (1.74 m) Comment: height measured without shoes  Wt 230 lb 6 oz (104.5 kg)   BMI 34.52 kg/m   General: 61 year old [place] male in no acute distress. Head: Normocephalic and atraumatic. Eyes:  Sclerae non-icteric, conjunctive pink. Ears: Normal auditory acuity. Mouth: Dentition intact. No ulcers or lesions.  Neck: Supple, no lymphadenopathy or thyromegaly.  Lungs: Clear bilaterally to auscultation without wheezes, crackles or rhonchi. Heart: Regular rate and  rhythm. No murmur, rub or gallop appreciated.  Abdomen: Soft, nontender, non distended. No masses. No hepatosplenomegaly. Normoactive bowel sounds x 4 quadrants.  Rectal: Deferred. Musculoskeletal: Symmetrical with no gross deformities. Skin: Warm and dry. No rash or lesions on visible extremities. Extremities: No edema. Neurological: Alert oriented x 4, no focal deficits.  Psychological:  Alert and cooperative. Normal mood and affect.  ASSESSMENT AND PLAN:  62) 61 year old male with lower abdominal/pelvic pain x 4 months. CTAP 04/19/2022 showed circumferential wall thickening of the distal ileum/proximal colon at the ileocecal valve with a few prominent right mesenteric/pericolonic lymph nodes concerning for malignant process.  Colonoscopy 07/2021 identified 110 mm  polyp removed from the cecum, biopsies consistent with benign colonic mucosa, poor prep noted.  Patient also reported undergoing a colonoscopy approximately 5 years ago, which resulted in a poor prep. -MiraLAX nightly to increase stool output -2-day colonoscopy bowel prep -Colonoscopy benefits and risks discussed including risk with sedation, risk of bleeding, perforation and infection  -CBC, CMP and CRP  -Patient to contact our office if his abdominal pain worsens -Further recommendations to be determined after the above evaluation completed  2) Hepatic steatosis   3) History of mild GERD symptoms -OTC antiacid as needed     CC:  Plotnikov, Evie Lacks, MD

## 2022-06-22 NOTE — Telephone Encounter (Signed)
Noted../lmb 

## 2022-07-11 ENCOUNTER — Encounter: Payer: Self-pay | Admitting: Gastroenterology

## 2022-07-18 ENCOUNTER — Encounter: Payer: Self-pay | Admitting: Gastroenterology

## 2022-07-18 ENCOUNTER — Telehealth: Payer: Self-pay

## 2022-07-18 ENCOUNTER — Ambulatory Visit: Payer: 59 | Admitting: Gastroenterology

## 2022-07-18 VITALS — BP 116/80 | HR 81 | Temp 96.8°F | Resp 18 | Ht 68.0 in | Wt 230.0 lb

## 2022-07-18 DIAGNOSIS — Z860100 Personal history of colon polyps, unspecified: Secondary | ICD-10-CM

## 2022-07-18 DIAGNOSIS — R933 Abnormal findings on diagnostic imaging of other parts of digestive tract: Secondary | ICD-10-CM

## 2022-07-18 MED ORDER — SODIUM CHLORIDE 0.9 % IV SOLN: 500.0000 mL | Freq: Once | INTRAVENOUS | Status: DC

## 2022-07-18 NOTE — Progress Notes (Signed)
Patient here for colonoscopy today to evaluate abnormal CT scan and abdominal pain. He completed the prep, feels at baseline. Noted to be new onset AF with HR in 60s with stable BP today. EKG done which confirmed atrial fibrillation. He denies any palpitations, shortness of breath, or chest pains. He is not aware of having any history of this and otherwise feels well. Unfortunately he can't have anesthesia / colonoscopy today due to new arrhythmia and needs further evaluation. Will call his Primary care Dr. Alain Marion to see if he can be seen today or get him in the cardiology clinic ASAP. Will give some IVF in the [place] in case he has some dehydration from the prep. He otherwise feels well, understands my concerns, and we will reschedule him as soon as possible when he has completed cardiac workup and okay to proceed with colonoscopy. Patient agrees with the plan.

## 2022-07-18 NOTE — Progress Notes (Signed)
Called cardiology office. I was able to get him an appointment with cardiology A fibb clinic at 31 tomorrow AM, they had nothing available today, and his PCP did not have any openings today. Patient understands and will keep that appointment tomorrow AM. He will stay hydrated and drink plenty of fluids. If chest pain, shortness of breath, palpitations, not feeling well today, etc, needs to go to the ED but his HR and BP are normal at this time and he is asymptomatic. He agreed with the plan as outlined. Translator present to help communicate.

## 2022-07-18 NOTE — Telephone Encounter (Signed)
-----   Message from Yetta Flock, MD sent at 07/18/2022 12:15 PM EDT ----- Regarding: follow up colon [facility], This patient went into AF in the [facility] today and colonoscopy cancelled. I set him up to see cardiology tomorrow AM. Can you do a chart check in a few days. He really needs the colonoscopy done ASAP once cleared by cardiology, he has a possible colon mass. Hopefully they can work him up quickly. Can you keep me posted? Thanks

## 2022-07-18 NOTE — Progress Notes (Signed)
All questions asked and answered via interpreter.  Pt's states no medical or surgical changes since previsit or office visit.

## 2022-07-19 ENCOUNTER — Ambulatory Visit: Payer: 59 | Admitting: Family Medicine

## 2022-07-19 ENCOUNTER — Encounter (HOSPITAL_COMMUNITY): Payer: Self-pay | Admitting: Nurse Practitioner

## 2022-07-19 ENCOUNTER — Ambulatory Visit (HOSPITAL_COMMUNITY)
Admission: RE | Admit: 2022-07-19 | Discharge: 2022-07-19 | Disposition: A | Discharge status: Home / Self Care | Payer: 59 | Source: Ambulatory Visit | Attending: Nurse Practitioner | Admitting: Nurse Practitioner

## 2022-07-19 VITALS — BP 110/76 | HR 86 | Ht 68.0 in | Wt 225.0 lb

## 2022-07-19 DIAGNOSIS — D6869 Other thrombophilia: Secondary | ICD-10-CM | POA: Diagnosis not present

## 2022-07-19 DIAGNOSIS — G43909 Migraine, unspecified, not intractable, without status migrainosus: Secondary | ICD-10-CM | POA: Diagnosis not present

## 2022-07-19 DIAGNOSIS — R0683 Snoring: Secondary | ICD-10-CM | POA: Insufficient documentation

## 2022-07-19 DIAGNOSIS — I4891 Unspecified atrial fibrillation: Secondary | ICD-10-CM

## 2022-07-19 MED ORDER — METOPROLOL TARTRATE 25 MG PO TABS: 12.5000 mg | ORAL_TABLET | Freq: Two times a day (BID) | ORAL | 3 refills | Status: DC

## 2022-07-19 MED ORDER — APIXABAN 5 MG PO TABS: 5.0000 mg | ORAL_TABLET | Freq: Two times a day (BID) | ORAL | 3 refills | Status: DC

## 2022-07-19 NOTE — Patient Instructions (Signed)
Start metoprolol 1/2 tablet twice a day   Start Eliquis '5mg'$  twice a day

## 2022-07-19 NOTE — Progress Notes (Signed)
Primary Care Physician: Cassandria Anger, MD Referring Physician: Dr. Tomma Rakers is a 61 y.o. male with a h/o Migraine h/a's and allergies, that presented for colonoscopy yesterday  for abdominal pain and was found to be in afib with rate controlled, asymptomatic. Procedure was cancelled and he was asked to come here for appointment this am. 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 He is here today with his wife and an interpretor. His wife speaks English very well. His ekg today continues to  show afib, rate controlled in the 80's. He is still unaware.   He drinks very rare alcohol, no tobacco. Minimal caffeine.  His wife indicates that he snores with witnessed apnea. She states he has not slept well in some time.   I started him on low dose metoprolol and eliquis 5 mg bid for a CHA2DS2VASc  score of 0, in case cardioversion was needed.   After he left, I found a phone note form Dr. Havery Moros that stated he had a colonic mass and would need colonoscopy as asap.   I called the wife and told her not to take the eliquis( he had taken one already) but  continue the low dose BB. He was scheduled to see me back in one week. He is now scheduled to get an echo at 8 am and I will see at 9 am after the echo. If all looks acceptable, he then can reschedule colonoscopy.   Today, he denies symptoms of palpitations, chest pain, shortness of breath, orthopnea, PND, lower extremity edema, dizziness, presyncope, syncope, or neurologic sequela. + for abdominal pain.   Past Medical History:  Diagnosis Date   Allergic rhinitis    GERD (gastroesophageal reflux disease)    Low back pain    Sinusitis    Dr Constance Holster   Tinnitus 11/19/2009   Past Surgical History:  Procedure Laterality Date   CATARACT EXTRACTION     LUMBAR [place] SURGERY  11/19/2005   NASAL SINUS SURGERY  11/19/2006   TRANSFORAMINAL LUMBAR INTERBODY FUSION W/ MIS 1 LEVEL Left 12/26/2021   Procedure:  Minimally Invasive Transforaminal Lumbar Interbody Fusion, Left Lumbar Four-Five45;  Surgeon: Vallarie Mare, MD;  Location: [facility];  Service: Neurosurgery;  Laterality: Left;  Minimally Invasive Transforaminal Lumbar Interbody Fusion, Left Lumbar Four-Five45    Current Outpatient Medications  Medication Sig Dispense Refill   acetaminophen (TYLENOL) 650 MG CR tablet Take 1,300 mg by mouth every 8 (eight) hours as needed for pain.     apixaban (ELIQUIS) 5 MG TABS tablet Take 1 tablet (5 mg total) by mouth 2 (two) times daily. 60 tablet 3   EPINEPHrine (EPIPEN) 0.3 mg/0.3 mL DEVI Inject 0.3 mLs (0.3 mg total) into the muscle once. 1 Device 0   fluticasone (FLONASE) 50 MCG/ACT nasal spray Place 2 sprays into both nostrils daily. (Patient taking differently: Place 2 sprays into both nostrils as needed.) 16 g 6   levocetirizine (XYZAL) 5 MG tablet Take 5 mg by mouth as needed for allergies.     meloxicam (MOBIC) 15 MG tablet Take 15 mg by mouth as needed for pain.     metoprolol tartrate (LOPRESSOR) 25 MG tablet Take 0.5 tablets (12.5 mg total) by mouth 2 (two) times daily. 30 tablet 3   Polyethyl Glycol-Propyl Glycol (SYSTANE OP) Place 1 drop into both eyes daily as needed (Irritation).     PEG-KCl-NaCl-NaSulf-Na Asc-C (PLENVU) 140 g SOLR Take 140 g  by mouth as directed. Manufacturer's coupon Universal coupon code:BIN: P2366821; GROUP: [id number]; PCN: [id number]; ID: [id number]; PAY NO MORE $50 (Patient not taking: Reported on 07/18/2022) 1 each 0   Current Facility-Administered Medications  Medication Dose Route Frequency Provider Last Rate Last Admin   0.9 %  sodium chloride infusion  500 mL Intravenous Once Armbruster, Carlota Raspberry, MD        Allergies  Allergen Reactions   Shellfish Allergy Itching    Crab, lobsters and shrimp.     Social History   Socioeconomic History   Marital status: Significant Other    Spouse name: Not on file   Number of children: 4   Years of education: Not on file    Highest education level: Not on file  Occupational History   Occupation: Archivist  Tobacco Use   Smoking status: Former    Types: Cigarettes    Quit date: 2014    Years since quitting: 9.6   Smokeless tobacco: Never  Vaping Use   Vaping Use: Never used  Substance and Sexual Activity   Alcohol use: Yes    Comment: 1 per week   Drug use: No   Sexual activity: Not Currently  Other Topics Concern   Not on file  Social History Narrative   Married   Social Determinants of Health   Financial Resource Strain: Not on file  Food Insecurity: Not on file  Transportation Needs: Not on file  Physical Activity: Not on file  Stress: Not on file  Social Connections: Not on file  Intimate Partner Violence: Not on file    Family History  Problem Relation Age of Onset   Cancer Neg Hx     ROS- All systems are reviewed and negative except as per the HPI above  Physical Exam: Vitals:   07/19/22 0937  BP: 110/76  Pulse: 86  Weight: 102.1 kg  Height: '5\' 8"'$  (1.727 m)   Wt Readings from Last 3 Encounters:  07/19/22 102.1 kg  07/18/22 104.3 kg  06/13/22 104.5 kg    Labs: Lab Results  Component Value Date   NA 139 06/13/2022   K 4.2 06/13/2022   CL 101 06/13/2022   CO2 31 06/13/2022   GLUCOSE 109 (H) 06/13/2022   BUN 9 06/13/2022   CREATININE 1.00 06/13/2022   CALCIUM 9.1 06/13/2022   No results found for: "INR" Lab Results  Component Value Date   CHOL 195 05/05/2021   HDL 47.60 05/05/2021   LDLCALC 117 (H) 05/05/2021   TRIG 154.0 (H) 05/05/2021     GEN- The patient is well appearing, alert and oriented x 3 today.   Head- normocephalic, atraumatic Eyes-  Sclera clear, conjunctiva pink Ears- hearing intact Oropharynx- clear Neck- supple, no JVP Lymph- no cervical lymphadenopathy Lungs- Clear to ausculation bilaterally, normal work of breathing Heart- Regular rate and rhythm, no murmurs, rubs or gallops, PMI not laterally displaced GI- soft, NT, ND, +  BS Extremities- no clubbing, cyanosis, or edema MS- no significant deformity or atrophy Skin- no rash or lesion Psych- euthymic mood, full affect Neuro- strength and sensation are intact  EKG-Vent. rate 86 BPM PR interval * ms QRS duration 90 ms QT/QTcB 354/423 ms P-R-T axes * 229 41 Atrial fibrillation Right superior axis deviation Right ventricular hypertrophy Anteroseptal infarct , age undetermined Abnormal ECG No previous ECGs available    Assessment and Plan:  1. Afib Asymptomatic with rate controlled  Dx yesterday at time of colonoscopy  General education re afib and  triggers He does snore with witnessed apnea and will refer for sleep study  Start low dose metoprolol tartrate 12.5 mg bid   2. CHA2DS2VASc score of 0 I initially prescribed eliquis 5 mg bid in case cardioversion was needed down the road as pt told me thru interpretor that he was having the colonoscopy as routine as he has been having abdominal pain. I then read Dr. Doyne Keel note that mentioned pt had an abdominal mass and wanted to do the colonoscopy as soon as possible I discussed with wife at home and told her to put the eliquis up and do not take at this time. He had taken one already.   I will  get an echo right before my appointment next Thursday and hopefully if all is acceptable can get back to colonoscopy asap and further address afib in the future.   Geroge Baseman Bunyan Brier, [facility] [address] [place], [place] [address] [phone]

## 2022-07-26 ENCOUNTER — Encounter (HOSPITAL_COMMUNITY): Payer: Self-pay | Admitting: Nurse Practitioner

## 2022-07-26 ENCOUNTER — Ambulatory Visit (HOSPITAL_BASED_OUTPATIENT_CLINIC_OR_DEPARTMENT_OTHER)
Admission: RE | Admit: 2022-07-26 | Discharge: 2022-07-26 | Disposition: A | Discharge status: Home / Self Care | Payer: 59 | Source: Ambulatory Visit

## 2022-07-26 ENCOUNTER — Ambulatory Visit (HOSPITAL_COMMUNITY)
Admission: RE | Admit: 2022-07-26 | Discharge: 2022-07-26 | Disposition: A | Discharge status: Home / Self Care | Payer: 59 | Source: Ambulatory Visit | Attending: Nurse Practitioner | Admitting: Nurse Practitioner

## 2022-07-26 ENCOUNTER — Other Ambulatory Visit: Payer: Self-pay

## 2022-07-26 VITALS — BP 86/62 | HR 61 | Ht 68.0 in | Wt 229.6 lb

## 2022-07-26 DIAGNOSIS — I4891 Unspecified atrial fibrillation: Secondary | ICD-10-CM | POA: Diagnosis not present

## 2022-07-26 LAB — ECHOCARDIOGRAM COMPLETE
Calc EF: 60 %
S' Lateral: 3.2 cm
Single Plane A2C EF: 56.2 %
Single Plane A4C EF: 60.9 %

## 2022-07-26 NOTE — Patient Instructions (Signed)
Stop metoprolol

## 2022-07-26 NOTE — Progress Notes (Signed)
Echocardiogram 2D Echocardiogram has been performed.  Joe Hawkins 07/26/2022, 8:53 AM

## 2022-07-26 NOTE — Telephone Encounter (Signed)
Patient seen in AFIB clinic today. Echo was completed. They are awaiting results from Echo before providing clearance. Pt already has a follow up appt on 08/01/22 with Colleen. Colonoscopy can be rescheduled at the time of office visit. Thanks

## 2022-07-26 NOTE — Addendum Note (Signed)
Encounter addended by: Juluis Mire, RN on: 07/26/2022 9:35 AM  Actions taken: Medication long-term status modified, Order list changed, Clinical Note Signed

## 2022-07-26 NOTE — Addendum Note (Signed)
Encounter addended by: Sherran Needs, NP on: 07/26/2022 9:45 AM  Actions taken: Clinical Note Signed, Flowsheet accepted, Medication List reviewed, Problem List reviewed, Allergies reviewed, Level of Service modified

## 2022-07-26 NOTE — Telephone Encounter (Signed)
Thanks [employer]. Hopefully Echo back later today. I am actually hoping to get his colonoscopy done ASAP if approved per cardiology, hopefully  to be done next week, they are holding starting him on anticoagulation to get this done. I would not wait until Colleen's appointment for scheduling. I will await echo and touch base with cardiology tomorrow about his clearance for scheduling in the very near future. Thanks

## 2022-07-26 NOTE — Progress Notes (Signed)
Primary Care Physician: Cassandria Anger, MD Referring Physician: Dr. Tomma Rakers is a 61 y.o. male with a h/o Migraine h/a's and allergies, that presented for colonoscopy yesterday  for abdominal pain and was found to be in afib with rate controlled, asymptomatic. Procedure was cancelled and he was asked to come here for appointment this am. 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 He is here today with his wife and an interpretor. His wife speaks English very well. His ekg today continues to  show afib, rate controlled in the 80's. He is still unaware.   He drinks very rare alcohol, no tobacco. Minimal caffeine.  His wife indicates that he snores with witnessed apnea. She states he has not slept well in some time.   I started him on low dose metoprolol and eliquis 5 mg bid for a CHA2DS2VASc  score of 0, in case cardioversion was needed.   After he left, I found a phone note form Dr. Havery Moros that stated he had a colonic mass and would need colonoscopy as asap.   I called the wife and told her not to take the eliquis( he had taken one already) but  continue the low dose BB. He was scheduled to see me back in one week. He is now scheduled to get an echo at 8 am and I will see at 9 am after the echo. If all looks acceptable, he then can reschedule colonoscopy.   F/u one week 07/26/22. He was started on low dose BB with afib at 61 bpm but with a BP of 86/62 but for now will stop BB as I fear with colonic prep he may get syncopal. He had good rate control with afib on last visit with HR's in the 80's. Was hoping that possibly start of BB may help to restore SR. He had an echo earlier today and waiting the results. If unremarkable he go proceed to colonoscopy and restoring SR will be later addressed. He appears to be asymptomatic.   Today, he denies symptoms of palpitations, chest pain, shortness of breath, orthopnea, PND, lower extremity edema, dizziness,  presyncope, syncope, or neurologic sequela. + for abdominal pain.   Past Medical History:  Diagnosis Date   Allergic rhinitis    GERD (gastroesophageal reflux disease)    Low back pain    Sinusitis    Dr Constance Holster   Tinnitus 11/19/2009   Past Surgical History:  Procedure Laterality Date   CATARACT EXTRACTION     LUMBAR [place] SURGERY  11/19/2005   NASAL SINUS SURGERY  11/19/2006   TRANSFORAMINAL LUMBAR INTERBODY FUSION W/ MIS 1 LEVEL Left 12/26/2021   Procedure: Minimally Invasive Transforaminal Lumbar Interbody Fusion, Left Lumbar Four-Five45;  Surgeon: Vallarie Mare, MD;  Location: [facility];  Service: Neurosurgery;  Laterality: Left;  Minimally Invasive Transforaminal Lumbar Interbody Fusion, Left Lumbar Four-Five45    Current Outpatient Medications  Medication Sig Dispense Refill   acetaminophen (TYLENOL) 650 MG CR tablet Take 1,300 mg by mouth every 8 (eight) hours as needed for pain.     EPINEPHrine (EPIPEN) 0.3 mg/0.3 mL DEVI Inject 0.3 mLs (0.3 mg total) into the muscle once. 1 Device 0   fluticasone (FLONASE) 50 MCG/ACT nasal spray Place 2 sprays into both nostrils daily. (Patient taking differently: Place 2 sprays into both nostrils as needed.) 16 g 6   levocetirizine (XYZAL) 5 MG tablet Take 5 mg by mouth as needed for allergies.  meloxicam (MOBIC) 15 MG tablet Take 15 mg by mouth as needed for pain.     PEG-KCl-NaCl-NaSulf-Na Asc-C (PLENVU) 140 g SOLR Take 140 g by mouth as directed. Manufacturer's coupon Universal coupon code:BIN: P2366821; GROUP: [id number]; PCN: [id number]; ID: [id number]; PAY NO MORE $50 1 each 0   Polyethyl Glycol-Propyl Glycol (SYSTANE OP) Place 1 drop into both eyes daily as needed (Irritation).     apixaban (ELIQUIS) 5 MG TABS tablet Take 1 tablet (5 mg total) by mouth 2 (two) times daily. (Patient not taking: Reported on 07/26/2022) 60 tablet 3   Current Facility-Administered Medications  Medication Dose Route Frequency Provider Last Rate Last Admin   0.9 %   sodium chloride infusion  500 mL Intravenous Once Armbruster, Carlota Raspberry, MD        Allergies  Allergen Reactions   Shellfish Allergy Itching    Crab, lobsters and shrimp.     Social History   Socioeconomic History   Marital status: Significant Other    Spouse name: Not on file   Number of children: 4   Years of education: Not on file   Highest education level: Not on file  Occupational History   Occupation: Archivist  Tobacco Use   Smoking status: Former    Types: Cigarettes    Quit date: 2014    Years since quitting: 9.6   Smokeless tobacco: Never  Vaping Use   Vaping Use: Never used  Substance and Sexual Activity   Alcohol use: Yes    Comment: 1 per week   Drug use: No   Sexual activity: Not Currently  Other Topics Concern   Not on file  Social History Narrative   Married   Social Determinants of Health   Financial Resource Strain: Not on file  Food Insecurity: Not on file  Transportation Needs: Not on file  Physical Activity: Not on file  Stress: Not on file  Social Connections: Not on file  Intimate Partner Violence: Not on file    Family History  Problem Relation Age of Onset   Cancer Neg Hx     ROS- All systems are reviewed and negative except as per the HPI above  Physical Exam: Vitals:   07/26/22 0840  BP: (!) 86/62  Pulse: 61  Weight: 104.1 kg  Height: '5\' 8"'$  (1.727 m)   Wt Readings from Last 3 Encounters:  07/26/22 104.1 kg  07/19/22 102.1 kg  07/18/22 104.3 kg    Labs: Lab Results  Component Value Date   NA 139 06/13/2022   K 4.2 06/13/2022   CL 101 06/13/2022   CO2 31 06/13/2022   GLUCOSE 109 (H) 06/13/2022   BUN 9 06/13/2022   CREATININE 1.00 06/13/2022   CALCIUM 9.1 06/13/2022   No results found for: "INR" Lab Results  Component Value Date   CHOL 195 05/05/2021   HDL 47.60 05/05/2021   LDLCALC 117 (H) 05/05/2021   TRIG 154.0 (H) 05/05/2021     GEN- The patient is well appearing, alert and oriented x 3 today.    Head- normocephalic, atraumatic Eyes-  Sclera clear, conjunctiva pink Ears- hearing intact Oropharynx- clear Neck- supple, no JVP Lymph- no cervical lymphadenopathy Lungs- Clear to ausculation bilaterally, normal work of breathing Heart- Regular rate and rhythm, no murmurs, rubs or gallops, PMI not laterally displaced GI- soft, NT, ND, + BS Extremities- no clubbing, cyanosis, or edema MS- no significant deformity or atrophy Skin- no rash or lesion Psych- euthymic mood, full affect Neuro- strength and  sensation are intact  Vent. rate 61 BPM PR interval * ms QRS duration 96 ms QT/QTcB 384/386 ms P-R-T axes * 257 28 Atrial fibrillation Right superior axis deviation Incomplete right bundle branch block Right ventricular hypertrophy Anterior infarct , age undetermined Abnormal ECG When compared with ECG of 19-Jul-2022 09:49, PREVIOUS ECG IS PRESENT    Assessment and Plan:  1. Afib Asymptomatic with rate controlled  Dx recently  at time of colonoscopy  He does snore with witnessed apnea and will refer for sleep study   Low dose metoprolol tartrate 12.5 mg bid stopped for low BP at 86/62 Echo results are pending   2. CHA2DS2VASc score of 0 Currently not on anticoagulation so that colonoscopy can be done asap for abdominal mass  per Dr. Doyne Keel note  If all is acceptable with echo  can get back to colonoscopy asap and further address afib in the future.   F/u here in 6 weeks   Geroge Baseman. Sarann Tregre, [facility] [address] [place], [place] [address] [phone]

## 2022-07-27 MED ORDER — PLENVU 140 G PO SOLR: 140.0000 g | ORAL | 0 refills | Status: DC

## 2022-07-27 NOTE — Telephone Encounter (Signed)
Dr. Havery Moros, pt has been cleared by cardiology based on echo from yesterday. You do not have any available appts next week for colonoscopy in the [facility] unless we do a 7:30 am on Monday or Friday. Please advise, thanks.

## 2022-07-27 NOTE — Addendum Note (Signed)
Addended by: Yevette Edwards on: 07/27/2022 02:58 PM   Modules accepted: Orders

## 2022-07-27 NOTE — Telephone Encounter (Signed)
Called and spoke with patient via interpreter services (Interpreter ID: [id number]). Patient has been scheduled for a colonoscopy on Monday, 07/30/22 at 4 pm. Pt is aware at that he will need to arrive by 3 pm with a care partner. Pt would like new prep sent to [facility]. Pt will stop by the office today to pick up new instructions for his procedure on Monday. Pt verbalized understanding and had no concerns at the end of the call.  Ambulatory referral to GI in epic. Plenvu prep sent to [facility].  Secure staff message sent to pre-cert team to work on [place] for colonoscopy. New instructions printed and placed at 2nd floor receptionist desk for patient to pick up.

## 2022-07-27 NOTE — Telephone Encounter (Signed)
Great, thank you very much for your help with this [facility]

## 2022-07-27 NOTE — Telephone Encounter (Signed)
Thank you [facility].  I see I have an opening this Monday at 4 PM in the [facility].  If you can confirm that, Monday would be best to get this case done and then he can start anticoagulation by the cardiology team.  Joe Hawkins, patient with new diagnosis of A-fib which seems stable.  Cardiology recommending he have his colonoscopy done first and then they will plan on starting anticoagulation, there is possible colon mass he needs evaluated ASAP.  Just want to make you aware, hopefully case can be done Monday. Thanks

## 2022-07-30 ENCOUNTER — Encounter: Payer: Self-pay | Admitting: Gastroenterology

## 2022-07-30 ENCOUNTER — Ambulatory Visit (AMBULATORY_SURGERY_CENTER): Payer: 59 | Admitting: Gastroenterology

## 2022-07-30 VITALS — BP 102/61 | HR 81 | Temp 97.5°F | Resp 12 | Ht 68.0 in | Wt 230.0 lb

## 2022-07-30 DIAGNOSIS — R933 Abnormal findings on diagnostic imaging of other parts of digestive tract: Secondary | ICD-10-CM | POA: Diagnosis not present

## 2022-07-30 DIAGNOSIS — D124 Benign neoplasm of descending colon: Secondary | ICD-10-CM

## 2022-07-30 DIAGNOSIS — I4891 Unspecified atrial fibrillation: Secondary | ICD-10-CM | POA: Diagnosis not present

## 2022-07-30 DIAGNOSIS — K649 Unspecified hemorrhoids: Secondary | ICD-10-CM

## 2022-07-30 DIAGNOSIS — Z860100 Personal history of colon polyps, unspecified: Secondary | ICD-10-CM

## 2022-07-30 DIAGNOSIS — D120 Benign neoplasm of cecum: Secondary | ICD-10-CM | POA: Diagnosis not present

## 2022-07-30 DIAGNOSIS — K5730 Diverticulosis of large intestine without perforation or abscess without bleeding: Secondary | ICD-10-CM

## 2022-07-30 MED ORDER — SODIUM CHLORIDE 0.9 % IV SOLN: 500.0000 mL | Freq: Once | INTRAVENOUS | Status: DC

## 2022-07-30 NOTE — Progress Notes (Signed)
Called to room to assist during endoscopic procedure.  Patient ID and intended procedure confirmed with present staff. Received instructions for my participation in the procedure from the performing physician.  

## 2022-07-30 NOTE — Progress Notes (Signed)
[facility] History and Physical   Primary Care Physician:  Cassandria Anger, MD   Reason for Procedure:   Abnormal CT scan colon - rule out mass lesion  Plan:    colonoscopy     HPI: Joe Hawkins is a 61 y.o. male  here for colonoscopy to evaluate abnormal CT scan - CT 04/19/22 showed thickening of right colon, possible colonic mass when he presented for abdominal pain. He was in AF when he presented for colonoscopy on 07/18/22 - newly diagnosed and asymptomatic. Case was unfortunately cancelled that day. Referred to cardiology, Echo done and looks okay. They have cleared him for colonoscopy, holding off on anticoagulation until this exam is done. He denies any chest pains or palpitations, no breathing problems. Has occasional R sided abdominal pain, no blood in the stools. He wishes to proceed.  I have discussed risks / benefits of anesthesia and endoscopic procedure with Dionisio Orlick and they wish to proceed with the exams as outlined today.    Past Medical History:  Diagnosis Date   A-fib [facility])    Allergic rhinitis    GERD (gastroesophageal reflux disease)    Low back pain    Sinusitis    Dr Constance Holster   Tinnitus 11/19/2009    Past Surgical History:  Procedure Laterality Date   CATARACT EXTRACTION     LUMBAR [place] SURGERY  11/19/2005   NASAL SINUS SURGERY  11/19/2006   TRANSFORAMINAL LUMBAR INTERBODY FUSION W/ MIS 1 LEVEL Left 12/26/2021   Procedure: Minimally Invasive Transforaminal Lumbar Interbody Fusion, Left Lumbar Four-Five45;  Surgeon: Vallarie Mare, MD;  Location: [place] [facility];  Service: Neurosurgery;  Laterality: Left;  Minimally Invasive Transforaminal Lumbar Interbody Fusion, Left Lumbar Four-Five45    Prior to Admission medications   Medication Sig Start Date End Date Taking? Authorizing Provider  acetaminophen (TYLENOL) 650 MG CR tablet Take 1,300 mg by mouth every 8 (eight) hours as needed for pain.    [provider]  apixaban (ELIQUIS) 5 MG TABS  tablet Take 1 tablet (5 mg total) by mouth 2 (two) times daily. Patient not taking: Reported on 07/26/2022 07/19/22   Sherran Needs, NP  EPINEPHrine (EPIPEN) 0.3 mg/0.3 mL DEVI Inject 0.3 mLs (0.3 mg total) into the muscle once. 05/21/12   Rowe Clack, MD  fluticasone (FLONASE) 50 MCG/ACT nasal spray Place 2 sprays into both nostrils daily. Patient taking differently: Place 2 sprays into both nostrils as needed. 05/05/21   Hoyt Koch, MD  levocetirizine (XYZAL) 5 MG tablet Take 5 mg by mouth as needed for allergies.    [provider]  meloxicam (MOBIC) 15 MG tablet Take 15 mg by mouth as needed for pain.    [provider]  PEG-KCl-NaCl-NaSulf-Na Asc-C (PLENVU) 140 g SOLR Take 140 g by mouth as directed. Manufacturer's coupon Universal coupon code:BIN: P2366821; GROUP: [id number]; PCN: [id number]; ID: [id number]; PAY NO MORE $50 07/27/22   Mclain Freer, Carlota Raspberry, MD  Polyethyl Glycol-Propyl Glycol (SYSTANE OP) Place 1 drop into both eyes daily as needed (Irritation).    [provider]    Current Outpatient Medications  Medication Sig Dispense Refill   acetaminophen (TYLENOL) 650 MG CR tablet Take 1,300 mg by mouth every 8 (eight) hours as needed for pain.     apixaban (ELIQUIS) 5 MG TABS tablet Take 1 tablet (5 mg total) by mouth 2 (two) times daily. (Patient not taking: Reported on 07/26/2022) 60 tablet 3   EPINEPHrine (EPIPEN) 0.3 mg/0.3 mL DEVI  Inject 0.3 mLs (0.3 mg total) into the muscle once. 1 Device 0   fluticasone (FLONASE) 50 MCG/ACT nasal spray Place 2 sprays into both nostrils daily. (Patient taking differently: Place 2 sprays into both nostrils as needed.) 16 g 6   levocetirizine (XYZAL) 5 MG tablet Take 5 mg by mouth as needed for allergies.     meloxicam (MOBIC) 15 MG tablet Take 15 mg by mouth as needed for pain.     PEG-KCl-NaCl-NaSulf-Na Asc-C (PLENVU) 140 g SOLR Take 140 g by mouth as directed. Manufacturer's coupon Universal coupon code:BIN:  P2366821; GROUP: [id number]; PCN: [id number]; ID: [id number]; PAY NO MORE $50 1 each 0   Polyethyl Glycol-Propyl Glycol (SYSTANE OP) Place 1 drop into both eyes daily as needed (Irritation).     Current Facility-Administered Medications  Medication Dose Route Frequency Provider Last Rate Last Admin   0.9 %  sodium chloride infusion  500 mL Intravenous Once Nailah Luepke, Carlota Raspberry, MD       0.9 %  sodium chloride infusion  500 mL Intravenous Once Taylee Gunnells, Carlota Raspberry, MD        Allergies as of 07/30/2022 - Review Complete 07/30/2022  Allergen Reaction Noted   Shellfish allergy Itching 06/19/2012    Family History  Problem Relation Age of Onset   Cancer Neg Hx     Social History   Socioeconomic History   Marital status: Significant Other    Spouse name: Not on file   Number of children: 4   Years of education: Not on file   Highest education level: Not on file  Occupational History   Occupation: Archivist  Tobacco Use   Smoking status: Former    Types: Cigarettes    Quit date: 2014    Years since quitting: 9.6   Smokeless tobacco: Never  Vaping Use   Vaping Use: Never used  Substance and Sexual Activity   Alcohol use: Yes    Comment: 1 per week   Drug use: No   Sexual activity: Not Currently  Other Topics Concern   Not on file  Social History Narrative   Married   Social Determinants of Health   Financial Resource Strain: Not on file  Food Insecurity: Not on file  Transportation Needs: Not on file  Physical Activity: Not on file  Stress: Not on file  Social Connections: Not on file  Intimate Partner Violence: Not on file    Review of Systems: All other review of systems negative except as mentioned in the HPI.  Physical Exam: Vital signs BP (!) 109/56   Pulse 73   Temp (!) 97.5 F (36.4 C)   Ht '5\' 8"'$  (1.727 m)   Wt 230 lb (104.3 kg)   SpO2 97%   BMI 34.97 kg/m   General:   Alert,  Well-developed, pleasant and cooperative in NAD Lungs:  Clear throughout to  auscultation.   Heart:  atrial fibrillation - rate controlled Abdomen:  Soft, nontender and nondistended.   Neuro/Psych:  Alert and cooperative. Normal mood and affect. A and O x 3  Jolly Mango, MD [facility]

## 2022-07-30 NOTE — Progress Notes (Signed)
1543 HR > 100 with esmolol 25 mg given IV, MD updated, vss  

## 2022-07-30 NOTE — Progress Notes (Signed)
Report given to PACU, vss 

## 2022-07-30 NOTE — Patient Instructions (Addendum)
   Handout on polyps ,diverticulosis,& hemorrhoids given to you  Await pathology results on polyps removed     YOU HAD AN ENDOSCOPIC PROCEDURE TODAY AT [place]:   Refer to the procedure report that was given to you for any specific questions about what was found during the examination.  If the procedure report does not answer your questions, please call your gastroenterologist to clarify.  If you requested that your care partner not be given the details of your procedure findings, then the procedure report has been included in a sealed envelope for you to review at your convenience later.  YOU SHOULD EXPECT: Some feelings of bloating in the abdomen. Passage of more gas than usual.  Walking can help get rid of the air that was put into your GI tract during the procedure and reduce the bloating. If you had a lower endoscopy (such as a colonoscopy or flexible sigmoidoscopy) you may notice spotting of blood in your stool or on the toilet paper. If you underwent a bowel prep for your procedure, you may not have a normal bowel movement for a few days.  Please Note:  You might notice some irritation and congestion in your nose or some drainage.  This is from the oxygen used during your procedure.  There is no need for concern and it should clear up in a day or so.  SYMPTOMS TO REPORT IMMEDIATELY:  Following lower endoscopy (colonoscopy or flexible sigmoidoscopy):  Excessive amounts of blood in the stool  Significant tenderness or worsening of abdominal pains  Swelling of the abdomen that is new, acute  Fever of 100F or higher  Following upper endoscopy (EGD)  Vomiting of blood or coffee ground material  New chest pain or pain under the shoulder blades  Painful or persistently difficult swallowing  New shortness of breath  Fever of 100F or higher  Black, tarry-looking stools  For urgent or emergent issues, a gastroenterologist can be reached at any hour by calling [phone]  [phone]. Do not use MyChart messaging for urgent concerns.    DIET:  We do recommend a small meal at first, but then you may proceed to your regular diet.  Drink plenty of fluids but you should avoid alcoholic beverages for 24 hours.  ACTIVITY:  You should plan to take it easy for the rest of today and you should NOT DRIVE or use heavy machinery until tomorrow (because of the sedation medicines used during the test).    FOLLOW UP: Our staff will call the number listed on your records the next business day following your procedure.  We will call around 7:15- 8:00 am to check on you and address any questions or concerns that you may have regarding the information given to you following your procedure. If we do not reach you, we will leave a message.     If any biopsies were taken you will be contacted by phone or by letter within the next 1-3 weeks.  Please call us at [phone] if you have not heard about the biopsies in 3 weeks.    SIGNATURES/CONFIDENTIALITY: You and/or your care partner have signed paperwork which will be entered into your electronic medical record.  These signatures attest to the fact that that the information above on your After Visit Summary has been reviewed and is understood.  Full responsibility of the confidentiality of this discharge information lies with you and/or your care-partner.

## 2022-07-30 NOTE — Progress Notes (Signed)
1552 Ephedrine 5 mg given IV due to low BP, MD updated.

## 2022-07-30 NOTE — Op Note (Signed)
[facility] Patient Name: Joe Hawkins Procedure Date: 07/30/2022 3:12 PM MRN: [id number] Endoscopist: Remo Lipps P. Havery Moros , MD Age: 61 Referring MD:  Date of Birth: Aug 17, 1961 Gender: Male Account #: [id number] Procedure:                Colonoscopy Indications:              Abnormal CT of the GI tract - right sided colon /                            ileal thickening, concern for neoplasm. History of                            colon polyps, last exam limited by poor prep Medicines:                Monitored Anesthesia Care Procedure:                Pre-Anesthesia Assessment:                           - Prior to the procedure, a History and Physical                            was performed, and patient medications and                            allergies were reviewed. The patient's tolerance of                            previous anesthesia was also reviewed. The risks                            and benefits of the procedure and the sedation                            options and risks were discussed with the patient.                            All questions were answered, and informed consent                            was obtained. Prior Anticoagulants: The patient has                            taken no previous anticoagulant or antiplatelet                            agents. ASA Grade Assessment: III - A patient with                            severe systemic disease. After reviewing the risks                            and benefits, the patient was deemed in  satisfactory condition to undergo the procedure.                           After obtaining informed consent, the colonoscope                            was passed under direct vision. Throughout the                            procedure, the patient's blood pressure, pulse, and                            oxygen saturations were monitored continuously. The                            CF [id number]  #[id number] was introduced through the anus                            and advanced to the the terminal ileum, with                            identification of the appendiceal orifice and IC                            valve. The colonoscopy was performed without                            difficulty. The patient tolerated the procedure                            well. The quality of the bowel preparation was                            adequate. The terminal ileum, ileocecal valve,                            appendiceal orifice, and rectum were photographed. Scope In: 3:40:04 PM Scope Out: 3:57:16 PM Scope Withdrawal Time: 0 hours 15 minutes 55 seconds  Total Procedure Duration: 0 hours 17 minutes 12 seconds  Findings:                 The perianal and digital rectal examinations were                            normal.                           The terminal ileum was deeply intubated and                            appeared normal.                           A 3 mm polyp was found in the cecum. The polyp was  sessile. The polyp was removed with a cold snare.                            Resection and retrieval were complete.                           A diminutive polyp was found in the descending                            colon. The polyp was sessile. The polyp was removed                            with a cold snare. Resection and retrieval were                            complete.                           A few small-mouthed diverticula were found in the                            sigmoid colon.                           Internal hemorrhoids were found during retroflexion.                           The exam was otherwise without abnormality. No                            abnormality in the right colon or ileum to                            correlate with prior CT findings. Complications:            No immediate complications. Estimated blood loss:                             Minimal. Estimated Blood Loss:     Estimated blood loss was minimal. Impression:               - The examined portion of the ileum was normal.                           - One 3 mm polyp in the cecum, removed with a cold                            snare. Resected and retrieved.                           - One diminutive polyp in the descending colon,                            removed with a cold snare. Resected and retrieved.                           -  Diverticulosis in the sigmoid colon.                           - Internal hemorrhoids.                           - The examination was otherwise normal. Recommendation:           - Patient has a contact number available for                            emergencies. The signs and symptoms of potential                            delayed complications were discussed with the                            patient. Return to normal activities tomorrow.                            Written discharge instructions were provided to the                            patient.                           - Resume previous diet.                           - Continue present medications.                           - Await pathology results. Remo Lipps P. Darroll Bredeson, MD 07/30/2022 4:18:33 PM This report has been signed electronically.

## 2022-07-31 ENCOUNTER — Telehealth: Payer: Self-pay | Admitting: *Deleted

## 2022-07-31 ENCOUNTER — Telehealth (HOSPITAL_COMMUNITY): Payer: Self-pay | Admitting: *Deleted

## 2022-07-31 NOTE — Telephone Encounter (Signed)
  Follow up Call-     07/30/2022    3:20 PM 07/18/2022   11:03 AM  Call back number  Post procedure Call Back phone  # [phone] needs Khmer interperter  Permission to leave phone message Yes Yes     Patient questions:  Do you have a fever, pain , or abdominal swelling? No. Pain Score  0 *  Have you tolerated food without any problems? Yes.    Have you been able to return to your normal activities? Yes.    Do you have any questions about your discharge instructions: Diet   No. Medications  No. Follow up visit  No.  Do you have questions or concerns about your Care? No.  Actions: * If pain score is 4 or above: No action needed, pain <4.

## 2022-07-31 NOTE — Telephone Encounter (Signed)
-----   Message from Sherran Needs, NP sent at 07/31/2022  1:41 PM EDT ----- Can you call pt and have him restart eliquis (hold off metoprolol) and get back in here in 2 weeks so we can discuss cardioversion

## 2022-07-31 NOTE — Progress Notes (Unsigned)
     07/31/2022 Sixto Bowdish [id number] 04/12/1961   Chief Complaint:  History of Present Illness: Joe Hawkins is a 61 year old male with a past medical history of lower back pain, allergic rhinitis, atrial fibrillation, GERD and a cecal polyp.  He speaks Khmer therefore he presents with a [place]o interpreter who was present to facilitate communication throughout today's consult.    He underwent a colonoscopy with Dr. Havery Moros 07/30/2022 which showed   Colonoscopy 07/30/2022: - The examined portion of the ileum was normal. - One 3 mm polyp in the cecum, removed with a cold snare. Resected and retrieved. - One diminutive polyp in the descending colon, removed with a cold snare. Resected and retrieved. - Diverticulosis in the sigmoid colon. - Internal hemorrhoids. - The examination was otherwise normal.      Current Medications, Allergies, Past Medical History, Past Surgical History, Family History and Social History were reviewed in [employer] record.   Review of Systems:   Constitutional: Negative for fever, sweats, chills or weight loss.  Respiratory: Negative for shortness of breath.   Cardiovascular: Negative for chest pain, palpitations and leg swelling.  Gastrointestinal: See HPI.  Musculoskeletal: Negative for back pain or muscle aches.  Neurological: Negative for dizziness, headaches or paresthesias.    Physical Exam: There were no vitals taken for this visit. General: Well developed, w   ***male in no acute distress. Head: Normocephalic and atraumatic. Eyes: No scleral icterus. Conjunctiva pink . Ears: Normal auditory acuity. Mouth: Dentition intact. No ulcers or lesions.  Lungs: Clear throughout to auscultation. Heart: Regular rate and rhythm, no murmur. Abdomen: Soft, nontender and nondistended. No masses or hepatomegaly. Normal bowel sounds x 4 quadrants.  Rectal: *** Musculoskeletal: Symmetrical with no gross  deformities. Extremities: No edema. Neurological: Alert oriented x 4. No focal deficits.  Psychological: Alert and cooperative. Normal mood and affect  Assessment and Recommendations: ***

## 2022-07-31 NOTE — Telephone Encounter (Signed)
Sherran Needs, NP  Juluis Mire, RN Have him restart eliquis (hold off metoprolol) and get back in here in 2 weeks so we can discuss cardioversion

## 2022-08-01 ENCOUNTER — Encounter: Payer: Self-pay | Admitting: Nurse Practitioner

## 2022-08-01 ENCOUNTER — Ambulatory Visit: Payer: 59 | Admitting: Nurse Practitioner

## 2022-08-01 VITALS — BP 120/80 | HR 85 | Ht 68.5 in | Wt 229.0 lb

## 2022-08-01 DIAGNOSIS — K760 Fatty (change of) liver, not elsewhere classified: Secondary | ICD-10-CM | POA: Diagnosis not present

## 2022-08-01 NOTE — Patient Instructions (Addendum)
If you are age 63 or younger, your body mass index should be between 19-25. Your Body mass index is 34.31 kg/m. If this is out of the aformentioned range listed, please consider follow up with your Primary Care Provider.  ________________________________________________________  The [facility] providers would like to encourage you to use [facility] to communicate with providers for non-urgent requests or questions.  Due to long hold times on the telephone, sending your provider a message by [facility] may be a faster and more efficient way to get a response.  Please allow 48 business hours for a response.  Please remember that this is for non-urgent requests.  _______________________________________________________  REDUCE CARBOHYDRATE INTAKE: REDUCE BREAD/PASTA/RICE/POTATO/SWEETS, LOSE 10 TO 15 LBS AND EXERCISE AS TOLERATED TO REDUCE THE RISK OF DEVELOPING FATTY LIVER DISEASE.   RETURN TO OUR BASEMENT LAB TO HAVE REPEAT BLOOD TESTS TO CHECK YOUR LIVER ENZYMES IN 2 MONTHS (October 01, 2022)  OUR OFFICE WILL CONTACT YOU WITH YOUR COLONOSCOPY BIOPSY RESULTS.  Thank you for entrusting me with your care and choosing [facility].  Carl Best, NP

## 2022-08-02 NOTE — Progress Notes (Signed)
Agree with assessment and plan as outlined. Colleen of note he has had a negative hep panel and iron studies in the past. He should have a total hep A AB level and hep B surface antibody to check for immunity, and vaccinate for this if not immune. Thanks

## 2022-08-03 ENCOUNTER — Encounter: Payer: Self-pay | Admitting: Gastroenterology

## 2022-08-03 ENCOUNTER — Other Ambulatory Visit: Payer: Self-pay

## 2022-08-03 ENCOUNTER — Telehealth: Payer: Self-pay

## 2022-08-03 DIAGNOSIS — K760 Fatty (change of) liver, not elsewhere classified: Secondary | ICD-10-CM

## 2022-08-03 NOTE — Progress Notes (Signed)
Joe Hawkins, see Dr. Doyne Keel addendum. A hepatic panel was ordered in 2 months, pls add Hepatitis A total antibody and Hepatitis B surface antibody level to that lab order. He will need Hep A and Hep B vaccinations if not immune. THX.

## 2022-08-03 NOTE — Telephone Encounter (Signed)
-----   Message from Noralyn Pick, NP sent at 08/03/2022 10:16 AM EDT -----    ----- Message ----- From: Yetta Flock, MD Sent: 08/02/2022   7:46 AM EDT To: Noralyn Pick, NP     ----- Message ----- From: Noralyn Pick, NP Sent: 08/01/2022  12:27 PM EDT To: Yetta Flock, MD

## 2022-08-03 NOTE — Telephone Encounter (Signed)
Orders for labs placed in Epic 

## 2022-08-09 ENCOUNTER — Ambulatory Visit (INDEPENDENT_AMBULATORY_CARE_PROVIDER_SITE_OTHER): Payer: 59 | Admitting: Internal Medicine

## 2022-08-09 ENCOUNTER — Encounter: Payer: Self-pay | Admitting: Internal Medicine

## 2022-08-09 VITALS — BP 120/72 | HR 81 | Temp 98.2°F | Ht 68.5 in | Wt 227.4 lb

## 2022-08-09 DIAGNOSIS — K635 Polyp of colon: Secondary | ICD-10-CM

## 2022-08-09 DIAGNOSIS — K6389 Other specified diseases of intestine: Secondary | ICD-10-CM | POA: Diagnosis not present

## 2022-08-09 DIAGNOSIS — J029 Acute pharyngitis, unspecified: Secondary | ICD-10-CM

## 2022-08-09 DIAGNOSIS — L259 Unspecified contact dermatitis, unspecified cause: Secondary | ICD-10-CM | POA: Insufficient documentation

## 2022-08-09 DIAGNOSIS — I4819 Other persistent atrial fibrillation: Secondary | ICD-10-CM | POA: Diagnosis not present

## 2022-08-09 DIAGNOSIS — I4891 Unspecified atrial fibrillation: Secondary | ICD-10-CM | POA: Insufficient documentation

## 2022-08-09 DIAGNOSIS — L237 Allergic contact dermatitis due to plants, except food: Secondary | ICD-10-CM

## 2022-08-09 LAB — POC COVID19 BINAXNOW: SARS Coronavirus 2 Ag: NEGATIVE

## 2022-08-09 MED ORDER — TRIAMCINOLONE ACETONIDE 0.5 % EX CREA: 1.0000 | TOPICAL_CREAM | Freq: Three times a day (TID) | CUTANEOUS | 1 refills | Status: DC

## 2022-08-09 MED ORDER — AMOXICILLIN-POT CLAVULANATE 875-125 MG PO TABS: 1.0000 | ORAL_TABLET | Freq: Two times a day (BID) | ORAL | 0 refills | Status: DC

## 2022-08-09 MED ORDER — METHYLPREDNISOLONE 4 MG PO TBPK: ORAL_TABLET | ORAL | 0 refills | Status: DC

## 2022-08-09 NOTE — Assessment & Plan Note (Signed)
Dr Havery Moros - 13m polyp 9/23. Repeat colon is due in 2028

## 2022-08-09 NOTE — Assessment & Plan Note (Addendum)
New 07/2022 On Eliquis F/u w/Donna Kayleen Memos, NP

## 2022-08-09 NOTE — Progress Notes (Signed)
Subjective:  Patient ID: Joe Hawkins, male    DOB: 01-03-1961  Age: 61 y.o. MRN: [id number]  CC: Sore Throat (With sinus drainage) and Poison Ivy (Left ring finger been using triamterene cream not clearing up)   HPI Joe Hawkins presents for R abd pain, R hand rash, new A fib, sinusitis sx's - URI x 2 d, colon mass on CT f/u   Outpatient Medications Prior to Visit  Medication Sig Dispense Refill   apixaban (ELIQUIS) 5 MG TABS tablet Take 1 tablet (5 mg total) by mouth 2 (two) times daily. 60 tablet 3   acetaminophen (TYLENOL) 650 MG CR tablet Take 1,300 mg by mouth every 8 (eight) hours as needed for pain. (Patient not taking: Reported on 08/01/2022)     EPINEPHrine (EPIPEN) 0.3 mg/0.3 mL DEVI Inject 0.3 mLs (0.3 mg total) into the muscle once. (Patient not taking: Reported on 08/01/2022) 1 Device 0   fluticasone (FLONASE) 50 MCG/ACT nasal spray Place 2 sprays into both nostrils daily. (Patient not taking: Reported on 08/01/2022) 16 g 6   metoprolol tartrate (LOPRESSOR) 25 MG tablet Take 12.5 mg by mouth 2 (two) times daily. (Patient not taking: Reported on 08/01/2022)     levocetirizine (XYZAL) 5 MG tablet Take 5 mg by mouth as needed for allergies. (Patient not taking: Reported on 08/01/2022)     meloxicam (MOBIC) 15 MG tablet Take 15 mg by mouth as needed for pain. (Patient not taking: Reported on 08/01/2022)     Polyethyl Glycol-Propyl Glycol (SYSTANE OP) Place 1 drop into both eyes daily as needed (Irritation). (Patient not taking: Reported on 08/01/2022)     0.9 %  sodium chloride infusion      No facility-administered medications prior to visit.    ROS: Review of Systems  Constitutional:  Negative for appetite change, fatigue and unexpected weight change.  HENT:  Positive for postnasal drip, rhinorrhea, sinus pressure and sinus pain. Negative for congestion, nosebleeds, sneezing, sore throat and trouble swallowing.   Eyes:  Negative for itching and visual disturbance.  Respiratory:   Negative for cough.   Cardiovascular:  Negative for chest pain, palpitations and leg swelling.  Gastrointestinal:  Negative for abdominal distention, blood in stool, diarrhea and nausea.  Genitourinary:  Negative for frequency and hematuria.  Musculoskeletal:  Negative for back pain, gait problem, joint swelling and neck pain.  Skin:  Positive for rash.  Neurological:  Negative for dizziness, tremors, speech difficulty and weakness.  Hematological:  Does not bruise/bleed easily.  Psychiatric/Behavioral:  Negative for agitation, dysphoric mood and sleep disturbance. The patient is not nervous/anxious.     Objective:  BP 120/72 (BP Location: Left Arm)   Pulse 81   Temp 98.2 F (36.8 C) (Oral)   Ht 5' 8.5" (1.74 m)   Wt 227 lb 6.4 oz (103.1 kg)   SpO2 96%   BMI 34.07 kg/m   BP Readings from Last 3 Encounters:  08/09/22 120/72  08/01/22 120/80  07/30/22 102/61    Wt Readings from Last 3 Encounters:  08/09/22 227 lb 6.4 oz (103.1 kg)  08/01/22 229 lb (103.9 kg)  07/30/22 230 lb (104.3 kg)    Physical Exam Constitutional:      General: He is not in acute distress.    Appearance: He is well-developed. He is obese.     Comments: NAD  Eyes:     Conjunctiva/sclera: Conjunctivae normal.     Pupils: Pupils are equal, round, and reactive to light.  Neck:  Thyroid: No thyromegaly.     Vascular: No JVD.  Cardiovascular:     Rate and Rhythm: Normal rate. Rhythm irregular.     Heart sounds: Normal heart sounds. No murmur heard.    No friction rub. No gallop.  Pulmonary:     Effort: Pulmonary effort is normal. No respiratory distress.     Breath sounds: Normal breath sounds. No wheezing or rales.  Chest:     Chest wall: No tenderness.  Abdominal:     General: Bowel sounds are normal. There is no distension.     Palpations: Abdomen is soft. There is no mass.     Tenderness: There is no abdominal tenderness. There is no guarding or rebound.  Musculoskeletal:        General:  No tenderness. Normal range of motion.     Cervical back: Normal range of motion.  Lymphadenopathy:     Cervical: No cervical adenopathy.  Skin:    General: Skin is warm and dry.     Findings: Rash present.  Neurological:     Mental Status: He is alert and oriented to person, place, and time.     Cranial Nerves: No cranial nerve deficit.     Motor: No abnormal muscle tone.     Coordination: Coordination normal.     Gait: Gait normal.     Deep Tendon Reflexes: Reflexes are normal and symmetric.  Psychiatric:        Behavior: Behavior normal.        Thought Content: Thought content normal.        Judgment: Judgment normal.   Rash on ring finger - blisters     A total time of 45 minutes was spent preparing to see the patient, reviewing tests, x-rays, operative reports and other medical records.  Also, obtaining history and performing comprehensive physical exam.  Additionally, counseling the patient regarding the above listed issues.   Finally, documenting clinical information in the health records, coordination of care, educating the patient: A fib, colon mass on CT, rash, A fib  Lab Results  Component Value Date   WBC 9.9 06/13/2022   HGB 14.9 06/13/2022   HCT 45.8 06/13/2022   PLT 232.0 06/13/2022   GLUCOSE 109 (H) 06/13/2022   CHOL 195 05/05/2021   TRIG 154.0 (H) 05/05/2021   HDL 47.60 05/05/2021   LDLCALC 117 (H) 05/05/2021   ALT 52 06/13/2022   AST 45 (H) 06/13/2022   NA 139 06/13/2022   K 4.2 06/13/2022   CL 101 06/13/2022   CREATININE 1.00 06/13/2022   BUN 9 06/13/2022   CO2 31 06/13/2022   TSH 1.30 05/05/2021   PSA 0.54 01/11/2022   HGBA1C 5.5 01/01/2017    ECHOCARDIOGRAM COMPLETE  Result Date: 07/26/2022    ECHOCARDIOGRAM REPORT   Patient Name:   Joe Hawkins Date of Exam: 07/26/2022 Medical Rec #:  [id number]  Height:       68.0 in Accession #:    [id number] Weight:       225.0 lb Date of Birth:  11-Apr-1961  BSA:          2.149 m Patient Age:    34 years   BP:            106/70 mmHg Patient Gender: M          HR:           73 bpm. Exam Location:  Outpatient Procedure: 2D Echo, Cardiac Doppler and Color Doppler Indications:  I48.91* Unspeicified atrial fibrillation  History:        Patient has no prior history of Echocardiogram examinations.                 Risk Factors:GERD.  Sonographer:    Bernadene Person RDCS Referring Phys: [phone] [place]  1. Left ventricular ejection fraction, by estimation, is 55 to 60%. The left ventricle has normal function. The left ventricle has no regional wall motion abnormalities. Left ventricular diastolic parameters are indeterminate.  2. Right ventricular systolic function is mildly reduced. The right ventricular size is mildly enlarged. There is normal pulmonary artery systolic pressure. The estimated right ventricular systolic pressure is 49.6 mmHg.  3. Left atrial size was mildly dilated.  4. The mitral valve is normal in structure. No evidence of mitral valve regurgitation.  5. The aortic valve is tricuspid. Aortic valve regurgitation is not visualized. Aortic valve sclerosis/calcification is present, without any evidence of aortic stenosis.  6. The inferior vena cava is normal in size with <50% respiratory variability, suggesting right atrial pressure of 8 mmHg. FINDINGS  Left Ventricle: Left ventricular ejection fraction, by estimation, is 55 to 60%. The left ventricle has normal function. The left ventricle has no regional wall motion abnormalities. The left ventricular internal cavity size was normal in size. There is  no left ventricular hypertrophy. Left ventricular diastolic parameters are indeterminate. Right Ventricle: The right ventricular size is mildly enlarged. Right vetricular wall thickness was not well visualized. Right ventricular systolic function is mildly reduced. There is normal pulmonary artery systolic pressure. The tricuspid regurgitant velocity is 1.89 m/s, and with an assumed right atrial  pressure of 8 mmHg, the estimated right ventricular systolic pressure is 75.9 mmHg. Left Atrium: Left atrial size was mildly dilated. Right Atrium: Right atrial size was normal in size. Pericardium: There is no evidence of pericardial effusion. Mitral Valve: The mitral valve is normal in structure. No evidence of mitral valve regurgitation. Tricuspid Valve: The tricuspid valve is normal in structure. Tricuspid valve regurgitation is trivial. Aortic Valve: The aortic valve is tricuspid. Aortic valve regurgitation is not visualized. Aortic valve sclerosis/calcification is present, without any evidence of aortic stenosis. Pulmonic Valve: The pulmonic valve was not well visualized. Pulmonic valve regurgitation is not visualized. Aorta: The aortic root and ascending aorta are structurally normal, with no evidence of dilitation. Venous: The inferior vena cava is normal in size with less than 50% respiratory variability, suggesting right atrial pressure of 8 mmHg. IAS/Shunts: The interatrial septum was not well visualized.  LEFT VENTRICLE PLAX 2D LVIDd:         5.00 cm LVIDs:         3.20 cm LV PW:         0.80 cm LV IVS:        0.90 cm LVOT diam:     2.10 cm LV SV:         66 LV SV Index:   31 LVOT Area:     3.46 cm  LV Volumes (MOD) LV vol d, MOD A2C: 89.5 ml LV vol d, MOD A4C: 79.7 ml LV vol s, MOD A2C: 39.2 ml LV vol s, MOD A4C: 31.2 ml LV SV MOD A2C:     50.3 ml LV SV MOD A4C:     79.7 ml LV SV MOD BP:      52.3 ml RIGHT VENTRICLE RV S prime:     8.10 cm/s TAPSE (M-mode): 1.7 cm LEFT ATRIUM  Index        RIGHT ATRIUM           Index LA diam:        4.60 cm 2.14 cm/m   RA Area:     21.20 cm LA Vol (A2C):   87.3 ml 40.63 ml/m  RA Volume:   61.90 ml  28.81 ml/m LA Vol (A4C):   74.9 ml 34.86 ml/m LA Biplane Vol: 84.9 ml 39.51 ml/m  AORTIC VALVE LVOT Vmax:   96.15 cm/s LVOT Vmean:  60.000 cm/s LVOT VTI:    0.190 m  AORTA Ao Root diam: 3.00 cm Ao Asc diam:  3.50 cm TRICUSPID VALVE TR Peak grad:   14.3 mmHg  TR Vmax:        189.00 cm/s  SHUNTS Systemic VTI:  0.19 m Systemic Diam: 2.10 cm Oswaldo Milian MD Electronically signed by Oswaldo Milian MD Signature Date/Time: 07/26/2022/11:21:46 PM    Final     Assessment & Plan:   Problem List Items Addressed This Visit     Atrial fibrillation ([place])    New 07/2022 On Eliquis F/u w/Donna Kayleen Memos, NP      Colon polyp    Dr Havery Moros - 10m polyp 9/23. Repeat colon is due in 2028      Colonic mass    S/p GI eval by Dr AHavery Moros- 34mpolyp 9/23. Repeat colon is due in 2028      Contact dermatitis    R hand Doubt scabies Jaxan is sure it is poison ivy Medrol pac Triamc cream         Meds ordered this encounter  Medications   methylPREDNISolone (MEDROL DOSEPAK) 4 MG TBPK tablet    Sig: As directed    Dispense:  21 tablet    Refill:  0   triamcinolone cream (KENALOG) 0.5 %    Sig: Apply 1 Application topically 3 (three) times daily.    Dispense:  45 g    Refill:  1   amoxicillin-clavulanate (AUGMENTIN) 875-125 MG tablet    Sig: Take 1 tablet by mouth 2 (two) times daily.    Dispense:  20 tablet    Refill:  0      Follow-up: Return in about 6 months (around 02/07/2023) for Wellness Exam.  AlWalker KehrMD

## 2022-08-09 NOTE — Assessment & Plan Note (Addendum)
S/p GI eval by Dr Havery Moros - 43m polyp 9/23. Repeat colon is due in 2028

## 2022-08-09 NOTE — Assessment & Plan Note (Addendum)
R hand Doubt scabies Eldred is sure it is poison ivy Medrol pac Triamc cream

## 2022-08-21 ENCOUNTER — Encounter (HOSPITAL_COMMUNITY): Payer: Self-pay | Admitting: Physician Assistant

## 2022-08-21 ENCOUNTER — Ambulatory Visit (HOSPITAL_COMMUNITY)
Admission: RE | Admit: 2022-08-21 | Discharge: 2022-08-21 | Disposition: A | Discharge status: Home / Self Care | Payer: 59 | Source: Ambulatory Visit | Attending: Nurse Practitioner | Admitting: Nurse Practitioner

## 2022-08-21 VITALS — BP 114/88 | HR 71 | Ht 68.5 in | Wt 229.4 lb

## 2022-08-21 DIAGNOSIS — R109 Unspecified abdominal pain: Secondary | ICD-10-CM | POA: Insufficient documentation

## 2022-08-21 DIAGNOSIS — I4819 Other persistent atrial fibrillation: Secondary | ICD-10-CM | POA: Diagnosis not present

## 2022-08-21 DIAGNOSIS — Z7901 Long term (current) use of anticoagulants: Secondary | ICD-10-CM | POA: Insufficient documentation

## 2022-08-21 MED ORDER — APIXABAN 5 MG PO TABS: 5.0000 mg | ORAL_TABLET | Freq: Two times a day (BID) | ORAL | 3 refills | Status: DC

## 2022-08-21 NOTE — Patient Instructions (Signed)
Cardioversion scheduled for Wednesday, October 25th  -Come to afib clinic for the labs that are needed at [place] at the [facility] and go to admitting at [place] not eat or drink anything after midnight the night prior to your procedure.  - Take all your morning medication (except diabetic medications) with a sip of water prior to arrival.  - You will not be able to drive home after your procedure.  - Do NOT miss any doses of your blood thinner (ELIQUIS) - if you should miss a dose please notify our office immediately.  - If you feel as if you go back into normal rhythm prior to scheduled cardioversion, please notify our office immediately. If your procedure is canceled in the cardioversion suite you will be charged a cancellation fee.

## 2022-08-21 NOTE — Addendum Note (Signed)
Encounter addended by: Juluis Mire, RN on: 08/21/2022 11:45 AM  Actions taken: Order list changed

## 2022-08-21 NOTE — H&P (View-Only) (Signed)
Primary Care Physician: Cassandria Anger, MD Referring Physician: Dr. Tomma Rakers is a 61 y.o. male with a h/o Migraine h/a's and allergies, that presented for colonoscopy yesterday  for abdominal pain and was found to be in afib with rate controlled, asymptomatic. Procedure was cancelled and he was asked to come here for appointment this am. 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 He is here today with his wife and an interpretor. His wife speaks English very well. His ekg today continues to  show afib, rate controlled in the 80's. He is still unaware.   He drinks very rare alcohol, no tobacco. Minimal caffeine.  His wife indicates that he snores with witnessed apnea. She states he has not slept well in some time.   I started him on low dose metoprolol and eliquis 5 mg bid for a CHA2DS2VASc  score of 0, in case cardioversion was needed.   After he left, I found a phone note form Dr. Havery Moros that stated he had a colonic mass and would need colonoscopy as asap.   I called the wife and told her not to take the eliquis( he had taken one already) but  continue the low dose BB. He was scheduled to see me back in one week. He is now scheduled to get an echo at 8 am and I will see at 9 am after the echo. If all looks acceptable, he then can reschedule colonoscopy.   F/u one week 07/26/22. He was started on low dose BB with afib at 61 bpm but with a BP of 86/62 but for now will stop BB as I fear with colonic prep he may get syncopal. He had good rate control with afib on last visit with HR's in the 80's. Was hoping that possibly start of BB may help to restore SR. He had an echo earlier today and waiting the results. If unremarkable he go proceed to colonoscopy and restoring SR will be later addressed. He appears to be asymptomatic.   Follow up in the AF clinic 08/21/22. An interpreter was used for this visit. Patient remains in rate controlled afib and is  unaware of his arrhythmia. He resume Eliquis but has missed 3 doses. No bleeding issues on anticoagulation.   Today, he denies symptoms of palpitations, chest pain, shortness of breath, orthopnea, PND, lower extremity edema, dizziness, presyncope, syncope, or neurologic sequela. + for abdominal pain.   Past Medical History:  Diagnosis Date   A-fib [facility])    Allergic rhinitis    GERD (gastroesophageal reflux disease)    Low back pain    Sinusitis    Dr Constance Holster   Tinnitus 11/19/2009   Past Surgical History:  Procedure Laterality Date   CATARACT EXTRACTION     LUMBAR [place] SURGERY  11/19/2005   NASAL SINUS SURGERY  11/19/2006   TRANSFORAMINAL LUMBAR INTERBODY FUSION W/ MIS 1 LEVEL Left 12/26/2021   Procedure: Minimally Invasive Transforaminal Lumbar Interbody Fusion, Left Lumbar Four-Five45;  Surgeon: Vallarie Mare, MD;  Location: [place];  Service: Neurosurgery;  Laterality: Left;  Minimally Invasive Transforaminal Lumbar Interbody Fusion, Left Lumbar Four-Five45    Current Outpatient Medications  Medication Sig Dispense Refill   acetaminophen (TYLENOL) 650 MG CR tablet Take 1,300 mg by mouth every 8 (eight) hours as needed for pain.     apixaban (ELIQUIS) 5 MG TABS tablet Take 1 tablet (5 mg total) by mouth 2 (two) times daily.  60 tablet 3   EPINEPHrine (EPIPEN) 0.3 mg/0.3 mL DEVI Inject 0.3 mLs (0.3 mg total) into the muscle once. 1 Device 0   fluticasone (FLONASE) 50 MCG/ACT nasal spray Place 2 sprays into both nostrils daily. 16 g 6   methylPREDNISolone (MEDROL DOSEPAK) 4 MG TBPK tablet As directed 21 tablet 0   triamcinolone cream (KENALOG) 0.5 % Apply 1 Application topically 3 (three) times daily. 45 g 1   No current facility-administered medications for this encounter.    Allergies  Allergen Reactions   Shellfish Allergy Itching    Crab, lobsters and shrimp.     Social History   Socioeconomic History   Marital status: Significant Other    Spouse name: Not on file    Number of children: 4   Years of education: Not on file   Highest education level: Not on file  Occupational History   Occupation: Archivist  Tobacco Use   Smoking status: Former    Types: Cigarettes    Quit date: 2014    Years since quitting: 9.7   Smokeless tobacco: Never   Tobacco comments:    Former smoker 08/21/22  Vaping Use   Vaping Use: Never used  Substance and Sexual Activity   Alcohol use: Yes    Comment: 1 per week 08/21/22   Drug use: No   Sexual activity: Not Currently  Other Topics Concern   Not on file  Social History Narrative   Married   Social Determinants of Health   Financial Resource Strain: Not on file  Food Insecurity: Not on file  Transportation Needs: Not on file  Physical Activity: Not on file  Stress: Not on file  Social Connections: Not on file  Intimate Partner Violence: Not on file    Family History  Problem Relation Age of Onset   Cancer Neg Hx     ROS- All systems are reviewed and negative except as per the HPI above  Physical Exam: Vitals:   08/21/22 0929  BP: 114/88  Pulse: 71  Weight: 104.1 kg  Height: 5' 8.5" (1.74 m)    Wt Readings from Last 3 Encounters:  08/21/22 104.1 kg  08/09/22 103.1 kg  08/01/22 103.9 kg    Labs: Lab Results  Component Value Date   NA 139 06/13/2022   K 4.2 06/13/2022   CL 101 06/13/2022   CO2 31 06/13/2022   GLUCOSE 109 (H) 06/13/2022   BUN 9 06/13/2022   CREATININE 1.00 06/13/2022   CALCIUM 9.1 06/13/2022   No results found for: "INR" Lab Results  Component Value Date   CHOL 195 05/05/2021   HDL 47.60 05/05/2021   LDLCALC 117 (H) 05/05/2021   TRIG 154.0 (H) 05/05/2021    GEN- The patient is a well appearing male, alert and oriented x 3 today.   HEENT-head normocephalic, atraumatic, sclera clear, conjunctiva pink, hearing intact, trachea midline. Lungs- Clear to ausculation bilaterally, normal work of breathing Heart- irregular rate and rhythm, no murmurs, rubs or gallops   GI- soft, NT, ND, + BS Extremities- no clubbing, cyanosis, or edema MS- no significant deformity or atrophy Skin- no rash or lesion Psych- euthymic mood, full affect Neuro- strength and sensation are intact   ECG today demonstrates Afib Vent. rate 71 BPM PR interval * ms QRS duration 90 ms QT/QTcB 368/399 ms   Echo 07/26/22  1. Left ventricular ejection fraction, by estimation, is 55 to 60%. The left ventricle has normal function. The left ventricle has no regional wall  motion abnormalities. Left ventricular diastolic parameters are indeterminate.   2. Right ventricular systolic function is mildly reduced. The right ventricular size is mildly enlarged. There is normal pulmonary artery systolic pressure. The estimated right ventricular systolic pressure is 33.5 mmHg.   3. Left atrial size was mildly dilated.   4. The mitral valve is normal in structure. No evidence of mitral valve regurgitation.   5. The aortic valve is tricuspid. Aortic valve regurgitation is not  visualized. Aortic valve sclerosis/calcification is present, without any evidence of aortic stenosis.   6. The inferior vena cava is normal in size with <50% respiratory variability, suggesting right atrial pressure of 8 mmHg.    Assessment and Plan:  1. Persistent Afib Patient remains in persistent atrial fibrillation. Now that his colonoscopy has been completed and he is back on anticoagulation, will arrange for DCCV. Will need to schedule 3 weeks from now due to missed doses of Eliquis, stressed importance of taking this as prescribed.  Continue Eliquis 5 mg BID  2. CHA2DS2VASc score of 0 Continue Eliquis 5 mg BID   Follow up in the AF clinic post DCCV.    [facility] [address] [place], [place] [address] [phone]

## 2022-08-21 NOTE — Progress Notes (Signed)
Primary Care Physician: Cassandria Anger, MD Referring Physician: Dr. Tomma Rakers is a 61 y.o. male with a h/o Migraine h/a's and allergies, that presented for colonoscopy yesterday  for abdominal pain and was found to be in afib with rate controlled, asymptomatic. Procedure was cancelled and he was asked to come here for appointment this am. 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 He is here today with his wife and an interpretor. His wife speaks English very well. His ekg today continues to  show afib, rate controlled in the 80's. He is still unaware.   He drinks very rare alcohol, no tobacco. Minimal caffeine.  His wife indicates that he snores with witnessed apnea. She states he has not slept well in some time.   I started him on low dose metoprolol and eliquis 5 mg bid for a CHA2DS2VASc  score of 0, in case cardioversion was needed.   After he left, I found a phone note form Dr. Havery Moros that stated he had a colonic mass and would need colonoscopy as asap.   I called the wife and told her not to take the eliquis( he had taken one already) but  continue the low dose BB. He was scheduled to see me back in one week. He is now scheduled to get an echo at 8 am and I will see at 9 am after the echo. If all looks acceptable, he then can reschedule colonoscopy.   F/u one week 07/26/22. He was started on low dose BB with afib at 61 bpm but with a BP of 86/62 but for now will stop BB as I fear with colonic prep he may get syncopal. He had good rate control with afib on last visit with HR's in the 80's. Was hoping that possibly start of BB may help to restore SR. He had an echo earlier today and waiting the results. If unremarkable he go proceed to colonoscopy and restoring SR will be later addressed. He appears to be asymptomatic.   Follow up in the AF clinic 08/21/22. An interpreter was used for this visit. Patient remains in rate controlled afib and is  unaware of his arrhythmia. He resume Eliquis but has missed 3 doses. No bleeding issues on anticoagulation.   Today, he denies symptoms of palpitations, chest pain, shortness of breath, orthopnea, PND, lower extremity edema, dizziness, presyncope, syncope, or neurologic sequela. + for abdominal pain.   Past Medical History:  Diagnosis Date   A-fib [facility])    Allergic rhinitis    GERD (gastroesophageal reflux disease)    Low back pain    Sinusitis    Dr Constance Holster   Tinnitus 11/19/2009   Past Surgical History:  Procedure Laterality Date   CATARACT EXTRACTION     LUMBAR [place] SURGERY  11/19/2005   NASAL SINUS SURGERY  11/19/2006   TRANSFORAMINAL LUMBAR INTERBODY FUSION W/ MIS 1 LEVEL Left 12/26/2021   Procedure: Minimally Invasive Transforaminal Lumbar Interbody Fusion, Left Lumbar Four-Five45;  Surgeon: Vallarie Mare, MD;  Location: [place];  Service: Neurosurgery;  Laterality: Left;  Minimally Invasive Transforaminal Lumbar Interbody Fusion, Left Lumbar Four-Five45    Current Outpatient Medications  Medication Sig Dispense Refill   acetaminophen (TYLENOL) 650 MG CR tablet Take 1,300 mg by mouth every 8 (eight) hours as needed for pain.     apixaban (ELIQUIS) 5 MG TABS tablet Take 1 tablet (5 mg total) by mouth 2 (two) times daily.  60 tablet 3   EPINEPHrine (EPIPEN) 0.3 mg/0.3 mL DEVI Inject 0.3 mLs (0.3 mg total) into the muscle once. 1 Device 0   fluticasone (FLONASE) 50 MCG/ACT nasal spray Place 2 sprays into both nostrils daily. 16 g 6   methylPREDNISolone (MEDROL DOSEPAK) 4 MG TBPK tablet As directed 21 tablet 0   triamcinolone cream (KENALOG) 0.5 % Apply 1 Application topically 3 (three) times daily. 45 g 1   No current facility-administered medications for this encounter.    Allergies  Allergen Reactions   Shellfish Allergy Itching    Crab, lobsters and shrimp.     Social History   Socioeconomic History   Marital status: Significant Other    Spouse name: Not on file    Number of children: 4   Years of education: Not on file   Highest education level: Not on file  Occupational History   Occupation: Archivist  Tobacco Use   Smoking status: Former    Types: Cigarettes    Quit date: 2014    Years since quitting: 9.7   Smokeless tobacco: Never   Tobacco comments:    Former smoker 08/21/22  Vaping Use   Vaping Use: Never used  Substance and Sexual Activity   Alcohol use: Yes    Comment: 1 per week 08/21/22   Drug use: No   Sexual activity: Not Currently  Other Topics Concern   Not on file  Social History Narrative   Married   Social Determinants of Health   Financial Resource Strain: Not on file  Food Insecurity: Not on file  Transportation Needs: Not on file  Physical Activity: Not on file  Stress: Not on file  Social Connections: Not on file  Intimate Partner Violence: Not on file    Family History  Problem Relation Age of Onset   Cancer Neg Hx     ROS- All systems are reviewed and negative except as per the HPI above  Physical Exam: Vitals:   08/21/22 0929  BP: 114/88  Pulse: 71  Weight: 104.1 kg  Height: 5' 8.5" (1.74 m)    Wt Readings from Last 3 Encounters:  08/21/22 104.1 kg  08/09/22 103.1 kg  08/01/22 103.9 kg    Labs: Lab Results  Component Value Date   NA 139 06/13/2022   K 4.2 06/13/2022   CL 101 06/13/2022   CO2 31 06/13/2022   GLUCOSE 109 (H) 06/13/2022   BUN 9 06/13/2022   CREATININE 1.00 06/13/2022   CALCIUM 9.1 06/13/2022   No results found for: "INR" Lab Results  Component Value Date   CHOL 195 05/05/2021   HDL 47.60 05/05/2021   LDLCALC 117 (H) 05/05/2021   TRIG 154.0 (H) 05/05/2021    GEN- The patient is a well appearing male, alert and oriented x 3 today.   HEENT-head normocephalic, atraumatic, sclera clear, conjunctiva pink, hearing intact, trachea midline. Lungs- Clear to ausculation bilaterally, normal work of breathing Heart- irregular rate and rhythm, no murmurs, rubs or gallops   GI- soft, NT, ND, + BS Extremities- no clubbing, cyanosis, or edema MS- no significant deformity or atrophy Skin- no rash or lesion Psych- euthymic mood, full affect Neuro- strength and sensation are intact   ECG today demonstrates Afib Vent. rate 71 BPM PR interval * ms QRS duration 90 ms QT/QTcB 368/399 ms   Echo 07/26/22  1. Left ventricular ejection fraction, by estimation, is 55 to 60%. The left ventricle has normal function. The left ventricle has no regional wall  motion abnormalities. Left ventricular diastolic parameters are indeterminate.   2. Right ventricular systolic function is mildly reduced. The right ventricular size is mildly enlarged. There is normal pulmonary artery systolic pressure. The estimated right ventricular systolic pressure is 96.0 mmHg.   3. Left atrial size was mildly dilated.   4. The mitral valve is normal in structure. No evidence of mitral valve regurgitation.   5. The aortic valve is tricuspid. Aortic valve regurgitation is not  visualized. Aortic valve sclerosis/calcification is present, without any evidence of aortic stenosis.   6. The inferior vena cava is normal in size with <50% respiratory variability, suggesting right atrial pressure of 8 mmHg.    Assessment and Plan:  1. Persistent Afib Patient remains in persistent atrial fibrillation. Now that his colonoscopy has been completed and he is back on anticoagulation, will arrange for DCCV. Will need to schedule 3 weeks from now due to missed doses of Eliquis, stressed importance of taking this as prescribed.  Continue Eliquis 5 mg BID  2. CHA2DS2VASc score of 0 Continue Eliquis 5 mg BID   Follow up in the AF clinic post DCCV.    [facility] [address] [place], [place] [address] [phone]

## 2022-09-11 ENCOUNTER — Ambulatory Visit (HOSPITAL_COMMUNITY): Payer: 59 | Admitting: Nurse Practitioner

## 2022-09-12 ENCOUNTER — Ambulatory Visit (HOSPITAL_COMMUNITY): Payer: 59 | Admitting: Certified Registered Nurse Anesthetist

## 2022-09-12 ENCOUNTER — Ambulatory Visit (HOSPITAL_BASED_OUTPATIENT_CLINIC_OR_DEPARTMENT_OTHER): Payer: 59 | Admitting: Certified Registered Nurse Anesthetist

## 2022-09-12 ENCOUNTER — Encounter (HOSPITAL_COMMUNITY): Payer: Self-pay | Admitting: Cardiology

## 2022-09-12 ENCOUNTER — Ambulatory Visit (HOSPITAL_COMMUNITY)
Admission: RE | Admit: 2022-09-12 | Discharge: 2022-09-12 | Disposition: A | Discharge status: Home / Self Care | Payer: 59 | Source: Ambulatory Visit | Attending: Nurse Practitioner | Admitting: Nurse Practitioner

## 2022-09-12 ENCOUNTER — Ambulatory Visit (HOSPITAL_COMMUNITY)
Admission: RE | Admit: 2022-09-12 | Discharge: 2022-09-12 | Disposition: A | Discharge status: Home / Self Care | Payer: 59 | Attending: Cardiology | Admitting: Cardiology

## 2022-09-12 ENCOUNTER — Other Ambulatory Visit: Payer: Self-pay

## 2022-09-12 ENCOUNTER — Encounter (HOSPITAL_COMMUNITY)
Admission: RE | Disposition: A | Discharge status: Home / Self Care | Payer: Self-pay | Source: Home / Self Care | Attending: Cardiology

## 2022-09-12 DIAGNOSIS — I4510 Unspecified right bundle-branch block: Secondary | ICD-10-CM | POA: Insufficient documentation

## 2022-09-12 DIAGNOSIS — Z87891 Personal history of nicotine dependence: Secondary | ICD-10-CM | POA: Diagnosis not present

## 2022-09-12 DIAGNOSIS — I4891 Unspecified atrial fibrillation: Secondary | ICD-10-CM

## 2022-09-12 DIAGNOSIS — Z7901 Long term (current) use of anticoagulants: Secondary | ICD-10-CM | POA: Diagnosis not present

## 2022-09-12 DIAGNOSIS — I4819 Other persistent atrial fibrillation: Secondary | ICD-10-CM | POA: Diagnosis not present

## 2022-09-12 DIAGNOSIS — K219 Gastro-esophageal reflux disease without esophagitis: Secondary | ICD-10-CM | POA: Diagnosis not present

## 2022-09-12 DIAGNOSIS — R0681 Apnea, not elsewhere classified: Secondary | ICD-10-CM | POA: Insufficient documentation

## 2022-09-12 DIAGNOSIS — G43909 Migraine, unspecified, not intractable, without status migrainosus: Secondary | ICD-10-CM | POA: Insufficient documentation

## 2022-09-12 DIAGNOSIS — G709 Myoneural disorder, unspecified: Secondary | ICD-10-CM | POA: Diagnosis not present

## 2022-09-12 DIAGNOSIS — R0683 Snoring: Secondary | ICD-10-CM | POA: Diagnosis not present

## 2022-09-12 DIAGNOSIS — I4811 Longstanding persistent atrial fibrillation: Secondary | ICD-10-CM | POA: Diagnosis not present

## 2022-09-12 HISTORY — PX: CARDIOVERSION: SHX1299

## 2022-09-12 LAB — CBC
HCT: 50.3 % (ref 39.0–52.0)
Hemoglobin: 16.5 g/dL (ref 13.0–17.0)
MCH: 26.3 pg (ref 26.0–34.0)
MCHC: 32.8 g/dL (ref 30.0–36.0)
MCV: 80.1 fL (ref 80.0–100.0)
Platelets: 243 10*3/uL (ref 150–400)
RBC: 6.28 MIL/uL — ABNORMAL HIGH (ref 4.22–5.81)
RDW: 13.2 % (ref 11.5–15.5)
WBC: 9.1 10*3/uL (ref 4.0–10.5)
nRBC: 0 % (ref 0.0–0.2)

## 2022-09-12 LAB — BASIC METABOLIC PANEL
Anion gap: 10 (ref 5–15)
BUN: 17 mg/dL (ref 8–23)
CO2: 28 mmol/L (ref 22–32)
Calcium: 9.5 mg/dL (ref 8.9–10.3)
Chloride: 104 mmol/L (ref 98–111)
Creatinine, Ser: 1.14 mg/dL (ref 0.61–1.24)
GFR, Estimated: >60 mL/min (ref >60)
Glucose, Bld: 107 mg/dL — ABNORMAL HIGH (ref 70–99)
Potassium: 4.3 mmol/L (ref 3.5–5.1)
Sodium: 142 mmol/L (ref 135–145)

## 2022-09-12 SURGERY — CARDIOVERSION
Anesthesia: General

## 2022-09-12 MED ORDER — PROPOFOL 10 MG/ML IV BOLUS
INTRAVENOUS | Status: DC | PRN
  Administered 2022-09-12: 80 mg via INTRAVENOUS

## 2022-09-12 MED ORDER — SODIUM CHLORIDE 0.9 % IV SOLN: INTRAVENOUS | Status: DC

## 2022-09-12 MED ORDER — LIDOCAINE 2% (20 MG/ML) 5 ML SYRINGE
INTRAMUSCULAR | Status: DC | PRN
  Administered 2022-09-12: 20 mg via INTRAVENOUS

## 2022-09-12 NOTE — Transfer of Care (Signed)
Immediate Anesthesia Transfer of Care Note  Patient: Joe Hawkins  Procedure(s) Performed: CARDIOVERSION  Patient Location: Endoscopy Unit  Anesthesia Type:General  Level of Consciousness: drowsy and patient cooperative  Airway & Oxygen Therapy: Patient Spontanous Breathing  Post-op Assessment: Report given to RN and Post -op Vital signs reviewed and stable  Post vital signs: Reviewed and stable  Last Vitals:  Vitals Value Taken Time  BP 107/78   Temp    Pulse 76   Resp 14   SpO2 96%     Last Pain:  Vitals:   09/12/22 0952  TempSrc: Tympanic  PainSc: 0-No pain         Complications: No notable events documented.

## 2022-09-12 NOTE — Anesthesia Postprocedure Evaluation (Signed)
Anesthesia Post Note  Patient: Joe Hawkins  Procedure(s) Performed: CARDIOVERSION     Patient location during evaluation: PACU Anesthesia Type: General Level of consciousness: awake and alert Pain management: pain level controlled Vital Signs Assessment: post-procedure vital signs reviewed and stable Respiratory status: spontaneous breathing, nonlabored ventilation, respiratory function stable and patient connected to nasal cannula oxygen Cardiovascular status: blood pressure returned to baseline and stable Postop Assessment: no apparent nausea or vomiting Anesthetic complications: no   No notable events documented.  Last Vitals:  Vitals:   09/12/22 0952 09/12/22 1052  BP: 121/89 95/60  Pulse:  76  Resp: (!) 21 20  Temp: (!) 36.1 C 36.4 C  SpO2: 95% 99%    Last Pain:  Vitals:   09/12/22 1116  TempSrc:   PainSc: 0-No pain                 Tiajuana Amass

## 2022-09-12 NOTE — Interval H&P Note (Signed)
History and Physical Interval Note:  09/12/2022 10:28 AM  Joe Hawkins  has presented today for surgery, with the diagnosis of AFIB.  The various methods of treatment have been discussed with the patient and family. After consideration of risks, benefits and other options for treatment, the patient has consented to  Procedure(s): CARDIOVERSION (N/A) as a surgical intervention.  The patient's history has been reviewed, patient examined, no change in status, stable for surgery.  I have reviewed the patient's chart and labs.  Questions were answered to the patient's satisfaction.     [name]

## 2022-09-12 NOTE — Anesthesia Preprocedure Evaluation (Signed)
Anesthesia Evaluation  Patient identified by MRN, date of birth, ID band Patient awake    Reviewed: Allergy & Precautions, NPO status , Patient's Chart, lab work & pertinent test results  Airway Mallampati: III  TM Distance: >3 FB     Dental   Pulmonary former smoker,    breath sounds clear to auscultation       Cardiovascular + dysrhythmias Atrial Fibrillation  Rhythm:Irregular Rate:Normal     Neuro/Psych  Headaches,  Neuromuscular disease    GI/Hepatic Neg liver ROS, GERD  ,  Endo/Other  negative endocrine ROS  Renal/GU negative Renal ROS     Musculoskeletal   Abdominal   Peds  Hematology negative hematology ROS (+)   Anesthesia Other Findings   Reproductive/Obstetrics                             Anesthesia Physical Anesthesia Plan  ASA: 2  Anesthesia Plan: General   Post-op Pain Management:    Induction:   PONV Risk Score and Plan: 2 and Treatment may vary due to age or medical condition  Airway Management Planned: Natural Airway and Mask  Additional Equipment:   Intra-op Plan:   Post-operative Plan:   Informed Consent: I have reviewed the patients History and Physical, chart, labs and discussed the procedure including the risks, benefits and alternatives for the proposed anesthesia with the patient or authorized representative who has indicated his/her understanding and acceptance.       Plan Discussed with:   Anesthesia Plan Comments:         Anesthesia Quick Evaluation

## 2022-09-12 NOTE — CV Procedure (Signed)
Procedure: Electrical Cardioversion Indications:  Atrial Fibrillation  Procedure Details:  Consent: Risks of procedure as well as the alternatives and risks of each were explained to the (patient/caregiver).  Consent for procedure obtained.  Time Out: Verified patient identification, verified procedure, site/side was marked, verified correct patient position, special equipment/implants available, medications/allergies/relevent history reviewed, required imaging and test results available. PERFORMED.  Patient placed on cardiac monitor, pulse oximetry, supplemental oxygen as necessary.  Sedation given:  Propofol '80mg'$ , lidocaine '20mg'$  Pacer pads placed anterior and posterior chest.  Cardioverted 1 time(s).  Cardioversion with synchronized biphasic 150J shock.  Evaluation: Findings: Post procedure EKG shows: NSR Complications: None Patient did tolerate procedure well.  Time Spent Directly with the Patient:  37mnutes   HFreada Bergeron10/25/2023, 10:46 AM

## 2022-09-12 NOTE — Interval H&P Note (Signed)
History and Physical Interval Note:  09/12/2022 10:39 AM  [name]  has presented today for surgery, with the diagnosis of AFIB.  The various methods of treatment have been discussed with the patient and family. After consideration of risks, benefits and other options for treatment, the patient has consented to  Procedure(s): CARDIOVERSION (N/A) as a surgical intervention.  The patient's history has been reviewed, patient examined, no change in status, stable for surgery.  I have reviewed the patient's chart and labs.  Questions were answered to the patient's satisfaction.     Joe Hawkins

## 2022-09-12 NOTE — Discharge Instructions (Signed)

## 2022-09-14 ENCOUNTER — Encounter (HOSPITAL_COMMUNITY): Payer: Self-pay | Admitting: Cardiology

## 2022-09-19 ENCOUNTER — Encounter (HOSPITAL_COMMUNITY): Payer: Self-pay | Admitting: Nurse Practitioner

## 2022-09-19 ENCOUNTER — Ambulatory Visit (HOSPITAL_COMMUNITY)
Admission: RE | Admit: 2022-09-19 | Discharge: 2022-09-19 | Disposition: A | Discharge status: Home / Self Care | Payer: 59 | Source: Ambulatory Visit | Attending: Nurse Practitioner | Admitting: Nurse Practitioner

## 2022-09-19 VITALS — BP 108/70 | HR 68 | Ht 69.0 in | Wt 231.6 lb

## 2022-09-19 DIAGNOSIS — D6869 Other thrombophilia: Secondary | ICD-10-CM | POA: Diagnosis not present

## 2022-09-19 DIAGNOSIS — Z7901 Long term (current) use of anticoagulants: Secondary | ICD-10-CM | POA: Diagnosis not present

## 2022-09-19 DIAGNOSIS — I4819 Other persistent atrial fibrillation: Secondary | ICD-10-CM | POA: Diagnosis not present

## 2022-09-19 NOTE — Progress Notes (Signed)
Primary Care Physician: Cassandria Anger, MD Referring Physician: Dr. Tomma Rakers is a 61 y.o. male with a h/o Migraine h/a's and allergies, that presented for colonoscopy yesterday  for abdominal pain and was found to be in afib with rate controlled, asymptomatic. Procedure was cancelled and he was asked to come here for appointment this am. 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 He is here today with his wife and an interpretor. His wife speaks English very well. His ekg today continues to  show afib, rate controlled in the 80's. He is still unaware.   He drinks very rare alcohol, no tobacco. Minimal caffeine.  His wife indicates that he snores with witnessed apnea. She states he has not slept well in some time.   I started him on low dose metoprolol and eliquis 5 mg bid for a CHA2DS2VASc  score of 0, in case cardioversion was needed.   After he left, I found a phone note form Dr. Havery Moros that stated he had a colonic mass and would need colonoscopy as asap.   I called the wife and told her not to take the eliquis( he had taken one already) but  continue the low dose BB. He was scheduled to see me back in one week. He is now scheduled to get an echo at 8 am and I will see at 9 am after the echo. If all looks acceptable, he then can reschedule colonoscopy.   F/u one week 07/26/22. He was started on low dose BB with afib at 61 bpm but with a BP of 86/62 but for now will stop BB as I fear with colonic prep he may get syncopal. He had good rate control with afib on last visit with HR's in the 80's. Was hoping that possibly start of BB may help to restore SR. He had an echo earlier today and waiting the results. If unremarkable he go proceed to colonoscopy and restoring SR will be later addressed. He appears to be asymptomatic.   Follow up in the AF clinic 08/21/22. An interpreter was used for this visit. Patient remains in rate controlled afib and is  unaware of his arrhythmia. He resumed Eliquis but has missed 3 doses. No bleeding issues on anticoagulation.   F/u in afib clinic, 09/19/22. He had a successful cardioversion but found to have ERAF today. He is rate controlled and asymptomatic. He is here by himself today but speaks English very well.   Today, he denies symptoms of palpitations, chest pain, shortness of breath, orthopnea, PND, lower extremity edema, dizziness, presyncope, syncope, or neurologic sequela. + for abdominal pain.   Past Medical History:  Diagnosis Date   A-fib ([place])    Allergic rhinitis    GERD (gastroesophageal reflux disease)    Low back pain    Sinusitis    Dr Constance Holster   Tinnitus 11/19/2009   Past Surgical History:  Procedure Laterality Date   CARDIOVERSION N/A 09/12/2022   Procedure: CARDIOVERSION;  Surgeon: [name], MD;  Location[facility] ENDOSCOPY;  Service: Cardiovascular;  Laterality: N/A;   CATARACT EXTRACTION     LUMBAR [place] SURGERY  11/19/2005   NASAL [place]IN[place] SURGERY  11/19/2006   TRANSFORAMINAL LUMBAR INTERBODY FUSION W/ MIS 1 LEVEL Left 12/26/2021   Procedure: Minimally Invasive Transforaminal Lumbar Interbody Fusion, Left Lumbar [id number];  Surgeon: Vallarie Mare, MD;  Location: [facility];  Service: Neurosurgery;  Laterality: Left;  Minimally Invasive Transforaminal  Lumbar Interbody Fusion, Left Lumbar Four-Five45    Current Outpatient Medications  Medication Sig Dispense Refill   apixaban (ELIQUIS) 5 MG TABS tablet Take 1 tablet (5 mg total) by mouth 2 (two) times daily. 60 tablet 3   EPINEPHrine (EPIPEN) 0.3 mg/0.3 mL DEVI Inject 0.3 mLs (0.3 mg total) into the muscle once. 1 Device 0   fluticasone (FLONASE) 50 MCG/ACT nasal spray Place 2 sprays into both nostrils daily. (Patient taking differently: Place 2 sprays into both nostrils daily as needed for allergies.) 16 g 6   levocetirizine (XYZAL) 5 MG tablet Take 5 mg by mouth daily as needed for allergies.     triamcinolone cream  (KENALOG) 0.5 % Apply 1 Application topically 3 (three) times daily. (Patient taking differently: Apply 1 Application topically 3 (three) times daily as needed (rash/itching).) 45 g 1   No current facility-administered medications for this encounter.    Allergies  Allergen Reactions   Shellfish Allergy Itching    Crab, lobsters and shrimp.     Social History   Socioeconomic History   Marital status: Significant Other    Spouse name: Not on file   Number of children: 4   Years of education: Not on file   Highest education level: Not on file  Occupational History   Occupation: Archivist  Tobacco Use   Smoking status: Former    Types: Cigarettes    Quit date: 2014    Years since quitting: 9.8   Smokeless tobacco: Never   Tobacco comments:    Former smoker 08/21/22  Vaping Use   Vaping Use: Never used  Substance and Sexual Activity   Alcohol use: Yes    Comment: 1 per week 08/21/22   Drug use: No   Sexual activity: Not Currently  Other Topics Concern   Not on file  Social History Narrative   Married   Social Determinants of Health   Financial Resource Strain: Not on file  Food Insecurity: Not on file  Transportation Needs: Not on file  Physical Activity: Not on file  Stress: Not on file  Social Connections: Not on file  Intimate Partner Violence: Not on file    Family History  Problem Relation Age of Onset   Cancer Neg Hx     ROS- All systems are reviewed and negative except as per the HPI above  Physical Exam: Vitals:   09/19/22 0945  BP: 108/70  Pulse: 68  Weight: 105.1 kg  Height: '5\' 9"'$  (1.753 m)    Wt Readings from Last 3 Encounters:  09/19/22 105.1 kg  09/12/22 104.3 kg  08/21/22 104.1 kg    Labs: Lab Results  Component Value Date   NA 142 09/12/2022   K 4.3 09/12/2022   CL 104 09/12/2022   CO2 28 09/12/2022   GLUCOSE 107 (H) 09/12/2022   BUN 17 09/12/2022   CREATININE 1.14 09/12/2022   CALCIUM 9.5 09/12/2022   No results found for:  "INR" Lab Results  Component Value Date   CHOL 195 05/05/2021   HDL 47.60 05/05/2021   LDLCALC 117 (H) 05/05/2021   TRIG 154.0 (H) 05/05/2021    GEN- The patient is a well appearing male, alert and oriented x 3 today.   HEENT-head normocephalic, atraumatic, sclera clear, conjunctiva pink, hearing intact, trachea midline. Lungs- Clear to ausculation bilaterally, normal work of breathing Heart- irregular rate and rhythm, no murmurs, rubs or gallops  GI- soft, NT, ND, + BS Extremities- no clubbing, cyanosis, or edema MS- no  significant deformity or atrophy Skin- no rash or lesion Psych- euthymic mood, full affect Neuro- strength and sensation are intact   ECG today demonstrates  Vent. rate 68 BPM PR interval * ms QRS duration 96 ms QT/QTcB 372/395 ms P-R-T axes * 248 13 Atrial fibrillation Right superior axis deviation Incomplete right bundle branch block Right ventricular hypertrophy Possible Anterior infarct , age undetermined Abnormal ECG When compared with ECG of 12-Sep-2022 10:50, PREVIOUS ECG IS PRESENT   Echo 07/26/22  1. Left ventricular ejection fraction, by estimation, is 55 to 60%. The left ventricle has normal function. The left ventricle has no regional wall motion abnormalities. Left ventricular diastolic parameters are indeterminate.   2. Right ventricular systolic function is mildly reduced. The right ventricular size is mildly enlarged. There is normal pulmonary artery systolic pressure. The estimated right ventricular systolic pressure is 39.7 mmHg.   3. Left atrial size was mildly dilated.   4. The mitral valve is normal in structure. No evidence of mitral valve regurgitation.   5. The aortic valve is tricuspid. Aortic valve regurgitation is not  visualized. Aortic valve sclerosis/calcification is present, without any evidence of aortic stenosis.   6. The inferior vena cava is normal in size with <50% respiratory variability, suggesting right atrial pressure  of 8 mmHg.    Assessment and Plan:  1. Persistent Afib Patient remains in persistent atrial fibrillation.  Successful cardioversion but with ERAF Asymptomatic and rate controlled He did not tolerate low dose BB with significant  bradycardia and hypotension   2. CHA2DS2VASc score of 0 Continue Eliquis 5 mg BID, for now until he can be seen by EP and ablation vrs AAD's vrs rate control can be determined   To EP  Follow up in the AF clinic as needed   [place]. Leyanna Bittman, [facility] [address] [place], [place] [address] [phone]

## 2022-09-25 ENCOUNTER — Other Ambulatory Visit (INDEPENDENT_AMBULATORY_CARE_PROVIDER_SITE_OTHER): Payer: 59

## 2022-09-25 DIAGNOSIS — K760 Fatty (change of) liver, not elsewhere classified: Secondary | ICD-10-CM | POA: Diagnosis not present

## 2022-09-25 LAB — HEPATIC FUNCTION PANEL
ALT: 25 U/L (ref 0–53)
AST: 23 U/L (ref 0–37)
Albumin: 4.2 g/dL (ref 3.5–5.2)
Alkaline Phosphatase: 53 U/L (ref 39–117)
Bilirubin, Direct: 0.1 mg/dL (ref 0.0–0.3)
Total Bilirubin: 0.4 mg/dL (ref 0.2–1.2)
Total Protein: 7.7 g/dL (ref 6.0–8.3)

## 2022-09-26 LAB — HEPATITIS A ANTIBODY, TOTAL: Hepatitis A AB,Total: REACTIVE — AB

## 2022-09-26 LAB — HEPATITIS B SURFACE ANTIBODY,QUALITATIVE: Hep B S Ab: REACTIVE — AB

## 2022-10-01 ENCOUNTER — Telehealth: Payer: Self-pay | Admitting: Internal Medicine

## 2022-10-01 NOTE — Telephone Encounter (Signed)
Calling for RX for   LEVOCETIRIZINE 5 MG  Send to preferred pharmacy:  [id number] [address] [place], [id number]: [phone] Fax: [phone]

## 2022-10-02 MED ORDER — LEVOCETIRIZINE DIHYDROCHLORIDE 5 MG PO TABS: 5.0000 mg | ORAL_TABLET | Freq: Every day | ORAL | 5 refills | Status: DC | PRN

## 2022-10-02 NOTE — Telephone Encounter (Signed)
Pt up-to-date sent refill to [facility] KitchenJohny Chess

## 2022-10-22 ENCOUNTER — Ambulatory Visit: Payer: 59 | Attending: Cardiovascular Disease | Admitting: Cardiovascular Disease

## 2022-10-22 ENCOUNTER — Encounter: Payer: Self-pay | Admitting: Cardiovascular Disease

## 2022-10-22 VITALS — BP 108/70 | HR 79 | Ht 70.0 in | Wt 238.0 lb

## 2022-10-22 DIAGNOSIS — I4819 Other persistent atrial fibrillation: Secondary | ICD-10-CM

## 2022-10-22 NOTE — Patient Instructions (Signed)
Medication Instructions:  Your physician recommends that you continue on your current medications as directed. Please refer to the Current Medication list given to you today.  *If you need a refill on your cardiac medications before your next appointment, please call your pharmacy*  Lab Work: CBC and BMET prior to CT scan If you have labs (blood work) drawn today and your tests are completely normal, you will receive your results only by: [place] (if you have MyChart) OR A paper copy in the mail If you have any lab test that is abnormal or we need to change your treatment, we will call you to review the results.  Testing/Procedures: Your physician has requested that you have cardiac CT. Cardiac computed tomography (CT) is a painless test that uses an x-ray machine to take clear, detailed pictures of your heart. For further information please visit [facility]. Please follow instruction sheet as given.  Your physician has recommended that you have an ablation. Catheter ablation is a medical procedure used to treat some cardiac arrhythmias (irregular heartbeats). During catheter ablation, a long, thin, flexible tube is put into a blood vessel in your groin (upper thigh), or neck. This tube is called an ablation catheter. It is then guided to your heart through the blood vessel. Radio frequency waves destroy small areas of heart tissue where abnormal heartbeats may cause an arrhythmia to start. Please see the instruction sheet given to you today.  Follow-Up: At [facility], you and your health needs are our priority.  As part of our continuing mission to provide you with exceptional heart care, we have created designated Provider Care Teams.  These Care Teams include your primary Cardiologist (physician) and Advanced Practice Providers (APPs -  Physician Assistants and Nurse Practitioners) who all work together to provide you with the care you need, when you need it.  Your  next appointment:   See instruction letter  Important Information About [place]

## 2022-10-22 NOTE — Progress Notes (Signed)
Electrophysiology Office Note:    Date:  10/22/2022   ID:  Joe Hawkins, DOB November 18, 1961, MRN [id number]  PCP:  Cassandria Anger, MD   [facility] Providers Cardiologist:  None Electrophysiologist:  Melida Quitter, MD     Referring MD: Sherran Needs, NP   History of Present Illness:    Joe Hawkins is a 61 y.o. male with a hx listed below, significant for atrial fibrillation, referred for arrhythmia management.  He was recently diagnosed with atrial fibrillation detected incidentally at the time of a colonoscopy in August 2023. I reviewed several office visit notes from July and prior months, all of which documented normal rate and rhythm. He was given metoprolol and eliquis. In follow-up, he was hypotensive, and HR was 61 bpm, so betablocker was held. He underwent DC cardioversion with ERAF.   He tells me today that he has noticed his heart "feels like there is something crawling on it..  feel like there is a worm moving on it." He does not have significant shortness of breath or fatigue.   Past Medical History:  Diagnosis Date   A-fib ([place])    Allergic rhinitis    GERD (gastroesophageal reflux disease)    Low back pain    Sinusitis    Dr Constance Holster   Tinnitus 11/19/2009    Past Surgical History:  Procedure Laterality Date   CARDIOVERSION N/A 09/12/2022   Procedure: CARDIOVERSION;  Surgeon: [name], MD;  Location: [facility] ENDOSCOPY;  Service: Cardiovascular;  Laterality: N/A;   CATARACT EXTRACTION     LUMBAR [place] SURGERY  11/19/2005   NASAL SINUS SURGERY  11/19/2006   TRANSFORAMINAL LUMBAR INTERBODY FUSION W/ MIS 1 LEVEL Left 12/26/2021   Procedure: Minimally Invasive Transforaminal Lumbar Interbody Fusion, Left Lumbar [id number];  Surgeon: Vallarie Mare, MD;  Location: [facility];  Service: Neurosurgery;  Laterality: Left;  Minimally Invasive Transforaminal Lumbar Interbody Fusion, Left Lumbar Four-Five45    Current Medications: Current Meds   Medication Sig   apixaban (ELIQUIS) 5 MG TABS tablet Take 1 tablet (5 mg total) by mouth 2 (two) times daily.   EPINEPHrine (EPIPEN) 0.3 mg/0.3 mL DEVI Inject 0.3 mLs (0.3 mg total) into the muscle once.   fluticasone (FLONASE) 50 MCG/ACT nasal spray Place 2 sprays into both nostrils daily. (Patient taking differently: Place 2 sprays into both nostrils daily as needed for allergies.)   levocetirizine (XYZAL) 5 MG tablet Take 1 tablet (5 mg total) by mouth daily as needed for allergies.   triamcinolone cream (KENALOG) 0.5 % Apply 1 Application topically 3 (three) times daily. (Patient taking differently: Apply 1 Application topically 3 (three) times daily as needed (rash/itching).)     Allergies:   Shellfish allergy   Social History   Socioeconomic History   Marital status: Significant Other    Spouse name: Not on file   Number of children: 4   Years of education: Not on file   Highest education level: Not on file  Occupational History   Occupation: Archivist  Tobacco Use   Smoking status: Former    Types: Cigarettes    Quit date: 2014    Years since quitting: 9.9   Smokeless tobacco: Never   Tobacco comments:    Former smoker 08/21/22  Vaping Use   Vaping Use: Never used  Substance and Sexual Activity   Alcohol use: Yes    Comment: 1 per week 08/21/22   Drug use: No   Sexual activity: Not Currently  Other  Topics Concern   Not on file  Social History Narrative   Married   Social Determinants of Health   Financial Resource Strain: Not on file  Food Insecurity: Not on file  Transportation Needs: Not on file  Physical Activity: Not on file  Stress: Not on file  Social Connections: Not on file     Family History: The patient's family history is negative for Cancer.  ROS:   Please see the history of present illness.    All other systems reviewed and are negative.  EKGs/Labs/Other Studies Reviewed Today:      EKG:  Last EKG results: today - AF with right superior  axis, Q waves anterior and inferior leads   Recent Labs: 09/12/2022: BUN 17; Creatinine, Ser 1.14; Hemoglobin 16.5; Platelets 243; Potassium 4.3; Sodium 142 09/25/2022: ALT 25     Physical Exam:    VS:  BP 108/70   Pulse 79   Ht '5\' 10"'$  (1.778 m)   Wt 238 lb (108 kg)   SpO2 95%   BMI 34.15 kg/m     Wt Readings from Last 3 Encounters:  10/22/22 238 lb (108 kg)  09/19/22 231 lb 9.6 oz (105.1 kg)  09/12/22 230 lb (104.3 kg)     GEN:  Well nourished, well developed in no acute distress CARDIAC: Irregular rhythm, no murmurs, rubs, gallops RESPIRATORY:  Normal work of breathing MUSCULOSKELETAL: no edema    ASSESSMENT & PLAN:    Atrial fibrillation: persistent, diagnosed incidentally. He does have palpitations. We discussed management options including rate control and rhythm control. He would prefer rhythm control. I explained treatment with antiarrhythmic medications and ablation. We discussed the indication, rationale, logistics, anticipated benefits, and potential risks of the ablation procedure including but not limited to -- bleed at the groin access site, chest pain, damage to nearby organs such as the diaphragm, lungs, or esophagus, need for a drainage tube, or prolonged hospitalization. I explained that the risk for stroke, heart attack, need for open chest surgery, or even death is very low but not zero. I also explained the logistics and risks of antiarrhythmic drug therapy. he  expressed understanding and wishes to proceed with ablation. Secondary hypercoagulable state: CHADS2Vasc is 0 but will keep him on eliquis through the periprocedural period.        Medication Adjustments/Labs and Tests Ordered: Current medicines are reviewed at length with the patient today.  Concerns regarding medicines are outlined above.  Orders Placed This Encounter  Procedures   CT CARDIAC MORPH/PULM VEIN W/CM&W/O CA SCORE   Basic metabolic panel   CBC with Differential/Platelet   EKG  12-Lead   No orders of the defined types were placed in this encounter.    Signed, Melida Quitter, MD  10/22/2022 10:37 AM    [place]

## 2022-11-02 DIAGNOSIS — Z6833 Body mass index (BMI) 33.0-33.9, adult: Secondary | ICD-10-CM | POA: Diagnosis not present

## 2022-11-02 DIAGNOSIS — M5416 Radiculopathy, lumbar region: Secondary | ICD-10-CM | POA: Diagnosis not present

## 2022-11-15 ENCOUNTER — Ambulatory Visit (INDEPENDENT_AMBULATORY_CARE_PROVIDER_SITE_OTHER): Payer: 59 | Admitting: Internal Medicine

## 2022-11-15 ENCOUNTER — Encounter: Payer: Self-pay | Admitting: Internal Medicine

## 2022-11-15 VITALS — BP 124/70 | HR 100 | Temp 98.1°F | Ht 70.0 in | Wt 240.0 lb

## 2022-11-15 DIAGNOSIS — M792 Neuralgia and neuritis, unspecified: Secondary | ICD-10-CM | POA: Diagnosis not present

## 2022-11-15 DIAGNOSIS — G8929 Other chronic pain: Secondary | ICD-10-CM

## 2022-11-15 DIAGNOSIS — M5416 Radiculopathy, lumbar region: Secondary | ICD-10-CM | POA: Diagnosis not present

## 2022-11-15 DIAGNOSIS — R1032 Left lower quadrant pain: Secondary | ICD-10-CM

## 2022-11-15 DIAGNOSIS — M545 Low back pain, unspecified: Secondary | ICD-10-CM | POA: Diagnosis not present

## 2022-11-15 MED ORDER — GABAPENTIN 100 MG PO CAPS: 100.0000 mg | ORAL_CAPSULE | Freq: Every day | ORAL | 3 refills | Status: DC

## 2022-11-15 MED ORDER — METHYLPREDNISOLONE 4 MG PO TBPK: ORAL_TABLET | ORAL | 0 refills | Status: DC

## 2022-11-15 NOTE — Patient Instructions (Addendum)
Elastic waist pants Elastic stretchable belt, pants Blue-Emu cream --use 2-3 times a day: use on the painful area   Spine MRI to consider if not better

## 2022-11-15 NOTE — Assessment & Plan Note (Addendum)
Radiculopathic pain in T11-T12 distribution on the left.  The pain is worse at night. The pain is much worse at night.  She has a very hard time getting out of bed in the morning.  It sounds like the pain starts in his and goes around his left flank to the front of his stomach in the direction of the pubis and uvula close.  The pain with cross the midline.  There is no rash.  The pain is similar to the pain he had prior to his back surgery.  The patient saw his back surgeon Dr. Grandville Silos recently and was told that everything is healed well. S/p posterior/interbody fusion changes at L4-5. Elastic waist pants Elastic stretchable belt, pants Blue-Emu cream --use 2-3 times a day: use on the painful area MRI thoracic spine if not better

## 2022-11-15 NOTE — Assessment & Plan Note (Signed)
Radiculopathic pain in T11-T12 distribution on the left.  The pain is worse at night. The pain is much worse at night.  She has a very hard time getting out of bed in the morning.  It sounds like the pain starts in his and goes around his left flank to the front of his stomach in the direction of the pubis and uvula close.  The pain with cross the midline.  There is no rash.  The pain is similar to the pain he had prior to his back surgery.  The patient saw his back surgeon Dr. Grandville Silos recently and was told that everything is healed well. S/p posterior/interbody fusion changes at L4-5. Elastic waist pants Elastic stretchable belt, pants Blue-Emu cream --use 2-3 times a day: use on the painful area MRI thoracic spine if not better

## 2022-11-15 NOTE — Progress Notes (Signed)
Subjective:  Patient ID: Joe Hawkins, male    DOB: 1961-10-05  Age: 61 y.o. MRN: [id number]  CC: Abdominal Pain (Started on Sunday in lower abdomen feels like cramping feels like its on the opposite took tylenol and didn't help , thinks its muscle or nerve pain)   HPI Joe Hawkins presents for L abd pain.  The pain is much worse at night.  She has a very hard time getting out of bed in the morning.  It sounds like the pain starts in his and goes around his left flank to the front of his stomach in the direction of the pubis and uvula close.  The pain with cross the midline.  There is no rash.  The pain is similar to the pain he had prior to his back surgery.  The patient saw his back surgeon Dr. Grandville Silos recently and was told that everything is healed well. S/p posterior/interbody fusion changes at L4-5 noted.  He is here with his interpreter.  Outpatient Medications Prior to Visit  Medication Sig Dispense Refill   apixaban (ELIQUIS) 5 MG TABS tablet Take 1 tablet (5 mg total) by mouth 2 (two) times daily. 60 tablet 3   EPINEPHrine (EPIPEN) 0.3 mg/0.3 mL DEVI Inject 0.3 mLs (0.3 mg total) into the muscle once. 1 Device 0   fluticasone (FLONASE) 50 MCG/ACT nasal spray Place 2 sprays into both nostrils daily. (Patient taking differently: Place 2 sprays into both nostrils daily as needed for allergies.) 16 g 6   levocetirizine (XYZAL) 5 MG tablet Take 1 tablet (5 mg total) by mouth daily as needed for allergies. 30 tablet 5   triamcinolone cream (KENALOG) 0.5 % Apply 1 Application topically 3 (three) times daily. (Patient taking differently: Apply 1 Application topically 3 (three) times daily as needed (rash/itching).) 45 g 1   No facility-administered medications prior to visit.    ROS: Review of Systems  Constitutional:  Negative for appetite change, fatigue and unexpected weight change.  HENT:  Negative for congestion, nosebleeds, sneezing, sore throat and trouble swallowing.   Eyes:   Negative for itching and visual disturbance.  Respiratory:  Negative for cough.   Cardiovascular:  Negative for chest pain, palpitations and leg swelling.  Gastrointestinal:  Positive for abdominal pain. Negative for abdominal distention, blood in stool, diarrhea and nausea.  Genitourinary:  Negative for frequency and hematuria.  Musculoskeletal:  Positive for back pain. Negative for gait problem, joint swelling and neck pain.  Skin:  Negative for rash.  Neurological:  Negative for dizziness, tremors, speech difficulty and weakness.  Psychiatric/Behavioral:  Negative for agitation, dysphoric mood and sleep disturbance. The patient is not nervous/anxious.     Objective:  BP 124/70 (BP Location: Right Arm, Patient Position: Sitting, Cuff Size: Normal)   Pulse 100   Temp 98.1 F (36.7 C) (Oral)   Ht '5\' 10"'$  (1.778 m)   Wt 240 lb (108.9 kg)   SpO2 98%   BMI 34.44 kg/m   BP Readings from Last 3 Encounters:  11/15/22 124/70  10/22/22 108/70  09/19/22 108/70    Wt Readings from Last 3 Encounters:  11/15/22 240 lb (108.9 kg)  10/22/22 238 lb (108 kg)  09/19/22 231 lb 9.6 oz (105.1 kg)    Physical Exam Constitutional:      General: He is not in acute distress.    Appearance: He is well-developed. He is obese.     Comments: NAD  Eyes:     Conjunctiva/sclera: Conjunctivae normal.  Pupils: Pupils are equal, round, and reactive to light.  Neck:     Thyroid: No thyromegaly.     Vascular: No JVD.  Cardiovascular:     Rate and Rhythm: Normal rate and regular rhythm.     Heart sounds: Normal heart sounds. No murmur heard.    No friction rub. No gallop.  Pulmonary:     Effort: Pulmonary effort is normal. No respiratory distress.     Breath sounds: Normal breath sounds. No wheezing or rales.  Chest:     Chest wall: No tenderness.  Abdominal:     General: Bowel sounds are normal. There is no distension.     Palpations: Abdomen is soft. There is no mass.     Tenderness: There is  abdominal tenderness in the periumbilical area, suprapubic area and left lower quadrant. There is no guarding or rebound. Negative signs include McBurney's sign.     Hernia: No hernia is present.  Musculoskeletal:        General: No tenderness. Normal range of motion.     Cervical back: Normal range of motion.  Lymphadenopathy:     Cervical: No cervical adenopathy.  Skin:    General: Skin is warm and dry.     Findings: No rash.  Neurological:     Mental Status: He is alert and oriented to person, place, and time.     Cranial Nerves: No cranial nerve deficit.     Motor: No abnormal muscle tone.     Coordination: Coordination normal.     Gait: Gait normal.     Deep Tendon Reflexes: Reflexes are normal and symmetric.  Psychiatric:        Behavior: Behavior normal.        Thought Content: Thought content normal.        Judgment: Judgment normal.   LS spine somewhat stiff.  When laid on the table she has difficulty getting up without help.  Abdominal wall primarily on the left side is tender to palpation.  No masses or hernias. His abdomen is obese.  His belt is very tight.  No rash  Lab Results  Component Value Date   WBC 9.1 09/12/2022   HGB 16.5 09/12/2022   HCT 50.3 09/12/2022   PLT 243 09/12/2022   GLUCOSE 107 (H) 09/12/2022   CHOL 195 05/05/2021   TRIG 154.0 (H) 05/05/2021   HDL 47.60 05/05/2021   LDLCALC 117 (H) 05/05/2021   ALT 25 09/25/2022   AST 23 09/25/2022   NA 142 09/12/2022   K 4.3 09/12/2022   CL 104 09/12/2022   CREATININE 1.14 09/12/2022   BUN 17 09/12/2022   CO2 28 09/12/2022   TSH 1.30 05/05/2021   PSA 0.54 01/11/2022   HGBA1C 5.5 01/01/2017    No results found.  Assessment & Plan:   Problem List Items Addressed This Visit     Neuropathic pain - Primary    Radiculopathic pain in T11-T12 distribution on the left.  The pain is worse at night. The pain is much worse at night.  She has a very hard time getting out of bed in the morning.  It sounds  like the pain starts in his and goes around his left flank to the front of his stomach in the direction of the pubis and uvula close.  The pain with cross the midline.  There is no rash.  The pain is similar to the pain he had prior to his back surgery.  The patient saw  his back surgeon Dr. Grandville Silos recently and was told that everything is healed well. S/p posterior/interbody fusion changes at L4-5. Elastic waist pants Elastic stretchable belt, pants Blue-Emu cream --use 2-3 times a day: use on the painful area MRI thoracic spine if not better      LLQ abdominal pain    Radiculopathic pain in T11-T12 distribution on the left.  The pain is worse at night. The pain is much worse at night.  She has a very hard time getting out of bed in the morning.  It sounds like the pain starts in his and goes around his left flank to the front of his stomach in the direction of the pubis and uvula close.  The pain with cross the midline.  There is no rash.  The pain is similar to the pain he had prior to his back surgery.  The patient saw his back surgeon Dr. Grandville Silos recently and was told that everything is healed well. S/p posterior/interbody fusion changes at L4-5. Elastic waist pants Elastic stretchable belt, pants Blue-Emu cream --use 2-3 times a day: use on the painful area MRI thoracic spine if not better      Left lumbar radiculopathy    Radiculopathic pain in T11-T12 distribution on the left.  The pain is worse at night. The pain is much worse at night.  She has a very hard time getting out of bed in the morning.  It sounds like the pain starts in his and goes around his left flank to the front of his stomach in the direction of the pubis and uvula close.  The pain with cross the midline.  There is no rash.  The pain is similar to the pain he had prior to his back surgery.  The patient saw his back surgeon Dr. Grandville Silos recently and was told that everything is healed well. S/p posterior/interbody fusion  changes at L4-5. Elastic waist pants Elastic stretchable belt, pants Blue-Emu cream --use 2-3 times a day: use on the painful area MRI thoracic spine if not better      Relevant Medications   gabapentin (NEURONTIN) 100 MG capsule      Meds ordered this encounter  Medications   gabapentin (NEURONTIN) 100 MG capsule    Sig: Take 1-2 capsules (100-200 mg total) by mouth at bedtime.    Dispense:  60 capsule    Refill:  3   methylPREDNISolone (MEDROL DOSEPAK) 4 MG TBPK tablet    Sig: As directed    Dispense:  21 tablet    Refill:  0      Follow-up: Return in about 2 weeks (around 11/29/2022) for a follow-up visit.  Walker Kehr, MD

## 2022-12-10 ENCOUNTER — Ambulatory Visit: Payer: 59 | Attending: Cardiovascular Disease

## 2022-12-10 DIAGNOSIS — I4819 Other persistent atrial fibrillation: Secondary | ICD-10-CM

## 2022-12-10 LAB — CBC WITH DIFFERENTIAL/PLATELET

## 2022-12-11 LAB — CBC WITH DIFFERENTIAL/PLATELET
Basophils Absolute: 0 10*3/uL (ref 0.0–0.2)
Basos: 0 %
EOS (ABSOLUTE): 0.5 10*3/uL — ABNORMAL HIGH (ref 0.0–0.4)
Eos: 5 %
Hematocrit: 49.7 % (ref 37.5–51.0)
Hemoglobin: 15.9 g/dL (ref 13.0–17.7)
Immature Grans (Abs): 0 10*3/uL (ref 0.0–0.1)
Immature Granulocytes: 0 %
Lymphocytes Absolute: 3.6 10*3/uL — ABNORMAL HIGH (ref 0.7–3.1)
Lymphs: 38 %
MCH: 25.5 pg — ABNORMAL LOW (ref 26.6–33.0)
MCHC: 32 g/dL (ref 31.5–35.7)
MCV: 80 fL (ref 79–97)
Monocytes Absolute: 0.8 10*3/uL (ref 0.1–0.9)
Monocytes: 8 %
Neutrophils Absolute: 4.5 10*3/uL (ref 1.4–7.0)
Neutrophils: 49 %
Platelets: 250 10*3/uL (ref 150–450)
RBC: 6.23 x10E6/uL — ABNORMAL HIGH (ref 4.14–5.80)
RDW: 13.8 % (ref 11.6–15.4)
WBC: 9.4 10*3/uL (ref 3.4–10.8)

## 2022-12-11 LAB — BASIC METABOLIC PANEL
BUN/Creatinine Ratio: 13 (ref 10–24)
BUN: 14 mg/dL (ref 8–27)
CO2: 23 mmol/L (ref 20–29)
Calcium: 9.3 mg/dL (ref 8.6–10.2)
Chloride: 103 mmol/L (ref 96–106)
Creatinine, Ser: 1.1 mg/dL (ref 0.76–1.27)
Glucose: 112 mg/dL — ABNORMAL HIGH (ref 70–99)
Potassium: 4.2 mmol/L (ref 3.5–5.2)
Sodium: 142 mmol/L (ref 134–144)
eGFR: 75 mL/min/{1.73_m2} (ref >59)

## 2022-12-27 ENCOUNTER — Encounter (HOSPITAL_COMMUNITY): Payer: Self-pay | Admitting: *Deleted

## 2022-12-27 ENCOUNTER — Telehealth (HOSPITAL_COMMUNITY): Payer: Self-pay | Admitting: *Deleted

## 2022-12-27 NOTE — Telephone Encounter (Signed)
Reaching out to patient (with [place] interpreter, Erin Fulling ID [id number]) to offer assistance regarding upcoming cardiac imaging study; pt verbalizes understanding of appt date/time, parking situation and where to check in, pre-test NPO status, and verified current allergies; name and call back number provided for further questions should they arise  Gordy Clement RN Navigator [facility] [place] [facility] [phone] office [phone] cell

## 2022-12-28 ENCOUNTER — Ambulatory Visit (HOSPITAL_BASED_OUTPATIENT_CLINIC_OR_DEPARTMENT_OTHER)
Admission: RE | Admit: 2022-12-28 | Discharge: 2022-12-28 | Disposition: A | Discharge status: Home / Self Care | Payer: 59 | Source: Ambulatory Visit | Attending: Cardiovascular Disease | Admitting: Cardiovascular Disease

## 2022-12-28 DIAGNOSIS — I4819 Other persistent atrial fibrillation: Secondary | ICD-10-CM | POA: Diagnosis not present

## 2022-12-28 MED ORDER — IOHEXOL 350 MG/ML SOLN
100.0000 mL | Freq: Once | INTRAVENOUS | Status: AC | PRN
  Administered 2022-12-28: 100 mL via INTRAVENOUS

## 2022-12-28 NOTE — Pre-Procedure Instructions (Signed)
Instructed patient on the following items: Arrival time 0900 Nothing to eat or drink after midnight No meds AM of procedure Responsible person to drive you home and stay with you for 24 hrs  Have you missed any doses of anti-coagulant Elqiuis- hasn't missed any doses, Don't take Eliquis on Monday morning.

## 2022-12-31 ENCOUNTER — Other Ambulatory Visit (HOSPITAL_COMMUNITY): Payer: Self-pay

## 2022-12-31 ENCOUNTER — Other Ambulatory Visit: Payer: Self-pay

## 2022-12-31 ENCOUNTER — Encounter (HOSPITAL_COMMUNITY): Payer: Self-pay | Admitting: Cardiovascular Disease

## 2022-12-31 ENCOUNTER — Ambulatory Visit (HOSPITAL_COMMUNITY)
Admission: RE | Admit: 2022-12-31 | Discharge: 2022-12-31 | Disposition: A | Discharge status: Home / Self Care | Payer: 59 | Source: Ambulatory Visit | Attending: Cardiovascular Disease | Admitting: Cardiovascular Disease

## 2022-12-31 ENCOUNTER — Ambulatory Visit (HOSPITAL_COMMUNITY): Payer: 59 | Admitting: Certified Registered"

## 2022-12-31 ENCOUNTER — Encounter (HOSPITAL_COMMUNITY)
Admission: RE | Disposition: A | Discharge status: Home / Self Care | Payer: Self-pay | Source: Ambulatory Visit | Attending: Cardiovascular Disease

## 2022-12-31 ENCOUNTER — Ambulatory Visit (HOSPITAL_BASED_OUTPATIENT_CLINIC_OR_DEPARTMENT_OTHER): Payer: 59 | Admitting: Certified Registered"

## 2022-12-31 DIAGNOSIS — G709 Myoneural disorder, unspecified: Secondary | ICD-10-CM

## 2022-12-31 DIAGNOSIS — I4891 Unspecified atrial fibrillation: Secondary | ICD-10-CM | POA: Diagnosis not present

## 2022-12-31 DIAGNOSIS — K219 Gastro-esophageal reflux disease without esophagitis: Secondary | ICD-10-CM | POA: Insufficient documentation

## 2022-12-31 DIAGNOSIS — Z7901 Long term (current) use of anticoagulants: Secondary | ICD-10-CM | POA: Insufficient documentation

## 2022-12-31 DIAGNOSIS — I4819 Other persistent atrial fibrillation: Secondary | ICD-10-CM | POA: Diagnosis not present

## 2022-12-31 DIAGNOSIS — Z6834 Body mass index (BMI) 34.0-34.9, adult: Secondary | ICD-10-CM | POA: Insufficient documentation

## 2022-12-31 DIAGNOSIS — R002 Palpitations: Secondary | ICD-10-CM | POA: Diagnosis not present

## 2022-12-31 DIAGNOSIS — E785 Hyperlipidemia, unspecified: Secondary | ICD-10-CM | POA: Diagnosis not present

## 2022-12-31 DIAGNOSIS — E669 Obesity, unspecified: Secondary | ICD-10-CM | POA: Insufficient documentation

## 2022-12-31 DIAGNOSIS — Z87891 Personal history of nicotine dependence: Secondary | ICD-10-CM | POA: Insufficient documentation

## 2022-12-31 DIAGNOSIS — D6869 Other thrombophilia: Secondary | ICD-10-CM | POA: Diagnosis not present

## 2022-12-31 HISTORY — PX: ATRIAL FIBRILLATION ABLATION: EP1191

## 2022-12-31 LAB — POCT ACTIVATED CLOTTING TIME
Activated Clotting Time: 347 seconds
Activated Clotting Time: 358 seconds

## 2022-12-31 SURGERY — ATRIAL FIBRILLATION ABLATION
Anesthesia: General

## 2022-12-31 MED ORDER — SODIUM CHLORIDE 0.9 % IV SOLN: 250.0000 mL | INTRAVENOUS | Status: DC | PRN

## 2022-12-31 MED ORDER — SUGAMMADEX SODIUM 200 MG/2ML IV SOLN
INTRAVENOUS | Status: DC | PRN
  Administered 2022-12-31: 300 mg via INTRAVENOUS

## 2022-12-31 MED ORDER — EPHEDRINE SULFATE-NACL 50-0.9 MG/10ML-% IV SOSY
PREFILLED_SYRINGE | INTRAVENOUS | Status: DC | PRN
  Administered 2022-12-31: 5 mg via INTRAVENOUS

## 2022-12-31 MED ORDER — SODIUM CHLORIDE 0.9% FLUSH: 3.0000 mL | Freq: Two times a day (BID) | INTRAVENOUS | Status: DC

## 2022-12-31 MED ORDER — HEPARIN SODIUM (PORCINE) 1000 UNIT/ML IJ SOLN
INTRAMUSCULAR | Status: DC | PRN
  Administered 2022-12-31: 1000 [IU] via INTRAVENOUS

## 2022-12-31 MED ORDER — ONDANSETRON HCL 4 MG/2ML IJ SOLN: 4.0000 mg | Freq: Four times a day (QID) | INTRAMUSCULAR | Status: DC | PRN

## 2022-12-31 MED ORDER — ACETAMINOPHEN 500 MG PO TABS
1000.0000 mg | ORAL_TABLET | Freq: Four times a day (QID) | ORAL | Status: DC | PRN
  Administered 2022-12-31: 1000 mg via ORAL
  Filled 2022-12-31: qty 2

## 2022-12-31 MED ORDER — FENTANYL CITRATE (PF) 100 MCG/2ML IJ SOLN
INTRAMUSCULAR | Status: DC | PRN
  Administered 2022-12-31: 100 ug via INTRAVENOUS

## 2022-12-31 MED ORDER — DEXAMETHASONE SODIUM PHOSPHATE 10 MG/ML IJ SOLN
INTRAMUSCULAR | Status: DC | PRN
  Administered 2022-12-31: 5 mg via INTRAVENOUS

## 2022-12-31 MED ORDER — DOBUTAMINE INFUSION FOR EP/ECHO/NUC (1000 MCG/ML)
INTRAVENOUS | Status: DC | PRN
  Administered 2022-12-31: 20 ug/kg/min via INTRAVENOUS

## 2022-12-31 MED ORDER — COLCHICINE 0.6 MG PO TABS
0.6000 mg | ORAL_TABLET | Freq: Two times a day (BID) | ORAL | 0 refills | Status: DC
  Filled 2022-12-31: qty 10, 5d supply, fill #0

## 2022-12-31 MED ORDER — HEPARIN (PORCINE) IN NACL 1000-0.9 UT/500ML-% IV SOLN
INTRAVENOUS | Status: DC | PRN
  Administered 2022-12-31 (×4): 500 mL

## 2022-12-31 MED ORDER — PANTOPRAZOLE SODIUM 40 MG PO TBEC
40.0000 mg | DELAYED_RELEASE_TABLET | Freq: Every day | ORAL | 0 refills | Status: DC
  Filled 2022-12-31: qty 45, 45d supply, fill #0

## 2022-12-31 MED ORDER — DOBUTAMINE INFUSION FOR EP/ECHO/NUC (1000 MCG/ML)
INTRAVENOUS | Status: AC
  Filled 2022-12-31: qty 250

## 2022-12-31 MED ORDER — PHENYLEPHRINE 80 MCG/ML (10ML) SYRINGE FOR IV PUSH (FOR BLOOD PRESSURE SUPPORT)
PREFILLED_SYRINGE | INTRAVENOUS | Status: DC | PRN
  Administered 2022-12-31 (×3): 160 ug via INTRAVENOUS

## 2022-12-31 MED ORDER — SODIUM CHLORIDE 0.9 % IV SOLN: INTRAVENOUS | Status: DC

## 2022-12-31 MED ORDER — ROCURONIUM BROMIDE 10 MG/ML (PF) SYRINGE
PREFILLED_SYRINGE | INTRAVENOUS | Status: DC | PRN
  Administered 2022-12-31: 10 mg via INTRAVENOUS
  Administered 2022-12-31: 30 mg via INTRAVENOUS
  Administered 2022-12-31: 20 mg via INTRAVENOUS

## 2022-12-31 MED ORDER — PROTAMINE SULFATE 10 MG/ML IV SOLN
INTRAVENOUS | Status: DC | PRN
  Administered 2022-12-31: 50 mg via INTRAVENOUS

## 2022-12-31 MED ORDER — PROPOFOL 10 MG/ML IV BOLUS
INTRAVENOUS | Status: DC | PRN
  Administered 2022-12-31: 120 mg via INTRAVENOUS

## 2022-12-31 MED ORDER — ACETAMINOPHEN 325 MG PO TABS: 650.0000 mg | ORAL_TABLET | ORAL | Status: DC | PRN

## 2022-12-31 MED ORDER — PHENYLEPHRINE HCL-NACL 20-0.9 MG/250ML-% IV SOLN
INTRAVENOUS | Status: DC | PRN
  Administered 2022-12-31: 30 ug/min via INTRAVENOUS

## 2022-12-31 MED ORDER — SODIUM CHLORIDE 0.9% FLUSH: 3.0000 mL | INTRAVENOUS | Status: DC | PRN

## 2022-12-31 MED ORDER — LIDOCAINE 2% (20 MG/ML) 5 ML SYRINGE
INTRAMUSCULAR | Status: DC | PRN
  Administered 2022-12-31: 60 mg via INTRAVENOUS

## 2022-12-31 MED ORDER — HEPARIN SODIUM (PORCINE) 1000 UNIT/ML IJ SOLN
INTRAMUSCULAR | Status: DC | PRN
  Administered 2022-12-31: 18000 [IU] via INTRAVENOUS

## 2022-12-31 SURGICAL SUPPLY — 20 items
BAG SNAP BAND KOVER 36X36 (MISCELLANEOUS) IMPLANT
CATH ABLAT QDOT MICRO BI TC FJ (CATHETERS) IMPLANT
CATH OCTARAY 2.0 F 3-3-3-3-3 (CATHETERS) IMPLANT
CATH PIGTAIL STEERABLE D1 8.7 (WIRE) IMPLANT
CATH S-M CIRCA TEMP PROBE (CATHETERS) IMPLANT
CATH SOUNDSTAR ECO 8FR (CATHETERS) IMPLANT
CATH WEB BI DIR CSDF CRV REPRO (CATHETERS) IMPLANT
CLOSURE PERCLOSE PROSTYLE (VASCULAR PRODUCTS) IMPLANT
COVER SWIFTLINK CONNECTOR (BAG) ×1 IMPLANT
DEVICE CLOSURE MYNXGRIP 6/7F (Vascular Products) IMPLANT
MAT PREVALON FULL STRYKER (MISCELLANEOUS) IMPLANT
PACK EP LATEX FREE (CUSTOM PROCEDURE TRAY) ×1
PACK EP LF (CUSTOM PROCEDURE TRAY) ×1 IMPLANT
PAD DEFIB RADIO PHYSIO CONN (PAD) ×1 IMPLANT
PATCH CARTO3 (PAD) IMPLANT
SHEATH CARTO VIZIGO MED CURVE (SHEATH) IMPLANT
SHEATH PINNACLE 8F 10CM (SHEATH) IMPLANT
SHEATH PINNACLE 9F 10CM (SHEATH) IMPLANT
SHEATH PROBE COVER 6X72 (BAG) IMPLANT
TUBING SMART ABLATE COOLFLOW (TUBING) IMPLANT

## 2022-12-31 NOTE — H&P (Signed)
Electrophysiology Office Note:    Date:  12/31/2022   ID:  Joe Hawkins, DOB [birth year], MRN [id number]  PCP:  Cassandria Anger, MD   [place] Providers Cardiologist:  None Electrophysiologist:  Melida Quitter, MD     Referring MD: No ref. provider found   History of Present Illness:    Joe Hawkins is a 62 y.o. male with a hx listed below, significant for atrial fibrillation, referred for arrhythmia management.  He was recently diagnosed with atrial fibrillation detected incidentally at the time of a colonoscopy in August 2023. I reviewed several office visit notes from July and prior months, all of which documented normal rate and rhythm. He was given metoprolol and eliquis. In follow-up, he was hypotensive, and HR was 61 bpm, so betablocker was held. He underwent DC cardioversion with ERAF.   He tells me today that he has noticed his heart "feels like there is something crawling on it..  feel like there is a worm moving on it." He does not have significant shortness of breath or fatigue.  I reviewed the patient's CT and labs. There was no LAA thrombus. he  has not missed any doses of anticoagulation, and he took his dose last night. There have been no changes in the patient's diagnoses, medications, or condition since our recent clinic visit.   Past Medical History:  Diagnosis Date   A-fib ([place])    Allergic rhinitis    GERD (gastroesophageal reflux disease)    Low back pain    Sinusitis    Dr Constance Holster   Tinnitus 11/19/2009    Past Surgical History:  Procedure Laterality Date   CARDIOVERSION N/A 09/12/2022   Procedure: CARDIOVERSION;  Surgeon: [name], MD;  Location[facility] ENDOSCOPY;  Service: Cardiovascular;  Laterality: N/A;   CATARACT EXTRACTION     LUMBAR [place] SURGERY  11/19/2005   NASAL SINUS SURGERY  11/19/2006   TRANSFORAMINAL LUMBAR INTERBODY FUSION W/ MIS 1 LEVEL Left 12/26/2021   Procedure: Minimally Invasive Transforaminal Lumbar  Interbody Fusion, Left Lumbar [id number];  Surgeon: Vallarie Mare, MD;  Location: [place];  Service: Neurosurgery;  Laterality: Left;  Minimally Invasive Transforaminal Lumbar Interbody Fusion, Left Lumbar [id number]    Current Medications: Current Meds  Medication Sig   apixaban (ELIQUIS) 5 MG TABS tablet Take 1 tablet (5 mg total) by mouth 2 (two) times daily.   fluticasone (FLONASE) 50 MCG/ACT nasal spray Place 2 sprays into both nostrils daily. (Patient taking differently: Place 2 sprays into both nostrils daily as needed for allergies.)   gabapentin (NEURONTIN) 100 MG capsule Take 1-2 capsules (100-200 mg total) by mouth at bedtime.   levocetirizine (XYZAL) 5 MG tablet Take 1 tablet (5 mg total) by mouth daily as needed for allergies.   Polyethyl Glycol-Propyl Glycol (SYSTANE) 0.4-0.3 % SOLN Place 1-2 drops into both eyes 3 (three) times daily as needed (dry/irritated eyes.).   triamcinolone cream (KENALOG) 0.5 % Apply 1 Application topically 3 (three) times daily. (Patient taking differently: Apply 1 Application topically 3 (three) times daily as needed (rash/itching).)   Trolamine Salicylate (BLUE-EMU MAXIMUM PAIN RELIEF EX) Apply 1 Application topically 3 (three) times daily as needed (pain.).     Allergies:   Shellfish allergy   Social History   Socioeconomic History   Marital status: Significant Other    Spouse name: Not on file   Number of children: 4   Years of education: Not on file   Highest education level: Not on file  Occupational History   Occupation: Archivist  Tobacco Use   Smoking status: Former    Types: Cigarettes    Quit date: 2014    Years since quitting: 10.1   Smokeless tobacco: Never   Tobacco comments:    Former smoker 08/21/22  Vaping Use   Vaping Use: Never used  Substance and Sexual Activity   Alcohol use: Yes    Comment: 1 per week 08/21/22   Drug use: No   Sexual activity: Not Currently  Other Topics Concern   Not on file  Social  History Narrative   Married   Social Determinants of Health   Financial Resource Strain: Not on file  Food Insecurity: Not on file  Transportation Needs: Not on file  Physical Activity: Not on file  Stress: Not on file  Social Connections: Not on file     Family History: The patient's family history is negative for Cancer.  ROS:   Please see the history of present illness.    All other systems reviewed and are negative.  EKGs/Labs/Other Studies Reviewed Today:      EKG:  Last EKG results: today - AF with right superior axis, Q waves anterior and inferior leads   Recent Labs: 09/25/2022: ALT 25 12/10/2022: BUN 14; Creatinine, Ser 1.10; Hemoglobin 15.9; Platelets 250; Potassium 4.2; Sodium 142     Physical Exam:    VS:  Resp 18   Ht 5' 8"$  (1.727 m)   Wt 104.3 kg   BMI 34.97 kg/m     Wt Readings from Last 3 Encounters:  12/31/22 104.3 kg  11/15/22 108.9 kg  10/22/22 108 kg     GEN:  Well nourished, well developed in no acute distress CARDIAC: Irregular rhythm, no murmurs, rubs, gallops RESPIRATORY:  Normal work of breathing MUSCULOSKELETAL: no edema    ASSESSMENT & PLAN:    Atrial fibrillation: persistent, diagnosed incidentally. He does have palpitations. Plan to proceed with ablation today. He was given an opportunity to ask questions. All questions were answered. He stated that he recalled our conversation from clinic and would like to proceed. Secondary hypercoagulable state: CHADS2Vasc is 0 but will keep him on eliquis through the periprocedural period.        Medication Adjustments/Labs and Tests Ordered: Current medicines are reviewed at length with the patient today.  Concerns regarding medicines are outlined above.  Orders Placed This Encounter  Procedures   Informed Consent Details: Physician/Practitioner Attestation; Transcribe to consent form and obtain patient signature   Initiate Pre-op Protocol   Void on call to EP Lab   Confirm CBC and  BMP (or CMP) results within 7 days for inpatient and 30 days for outpatient:   Clip right and left femoral area PM before surgery   Clip right internal jugular area PM before surgery   Pre-admission testing diagnosis   EP STUDY   Insert peripheral IV   Meds ordered this encounter  Medications   0.9 %  sodium chloride infusion     Signed, Melida Quitter, MD  12/31/2022 10:10 AM    [place]

## 2022-12-31 NOTE — Progress Notes (Signed)
Consent forms obtained, all information gathered and procedures explained using interpretor [name] from [employer]. Patients SO Joe Hawkins present.

## 2022-12-31 NOTE — Anesthesia Preprocedure Evaluation (Signed)
Anesthesia Evaluation  Patient identified by MRN, date of birth, ID band Patient awake    Reviewed: Allergy & Precautions, NPO status , Patient's Chart, lab work & pertinent test results  Airway Mallampati: III  TM Distance: >3 FB     Dental  (+) Dental Advisory Given, Caps   Pulmonary former smoker   breath sounds clear to auscultation       Cardiovascular + dysrhythmias Atrial Fibrillation  Rhythm:Irregular Rate:Normal     Neuro/Psych  Headaches  Neuromuscular disease    GI/Hepatic Neg liver ROS,GERD  Medicated,,  Endo/Other  negative endocrine ROS  Obesity Hyperlipidemia  Renal/GU negative Renal ROS     Musculoskeletal S/P lumbar surgery   Abdominal  (+) + obese  Peds  Hematology negative hematology ROS (+)   Anesthesia Other Findings   Reproductive/Obstetrics                              Anesthesia Physical Anesthesia Plan  ASA: 3  Anesthesia Plan: General   Post-op Pain Management: Minimal or no pain anticipated   Induction:   PONV Risk Score and Plan: 2 and Treatment may vary due to age or medical condition and Ondansetron  Airway Management Planned: Oral ETT  Additional Equipment:   Intra-op Plan:   Post-operative Plan: Extubation in OR  Informed Consent: I have reviewed the patients History and Physical, chart, labs and discussed the procedure including the risks, benefits and alternatives for the proposed anesthesia with the patient or authorized representative who has indicated his/her understanding and acceptance.     Dental advisory given  Plan Discussed with: Anesthesiologist and CRNA  Anesthesia Plan Comments:          Anesthesia Quick Evaluation

## 2022-12-31 NOTE — Transfer of Care (Signed)
Immediate Anesthesia Transfer of Care Note  Patient: Lynn Fouch  Procedure(s) Performed: ATRIAL FIBRILLATION ABLATION  Patient Location: Cath Lab  Anesthesia Type:General  Level of Consciousness: awake, drowsy, and patient cooperative  Airway & Oxygen Therapy: Patient Spontanous Breathing and Patient connected to nasal cannula oxygen  Post-op Assessment: Report given to RN, Post -op Vital signs reviewed and stable, and Patient moving all extremities X 4  Post vital signs: Reviewed and stable  Last Vitals:  Vitals Value Taken Time  BP    Temp    Pulse 88 12/31/22 1252  Resp 17 12/31/22 1252  SpO2 93 % 12/31/22 1252  Vitals shown include unvalidated device data.  Last Pain:  Vitals:   12/31/22 0947  PainSc: 0-No pain         Complications: There were no known notable events for this encounter.

## 2022-12-31 NOTE — Anesthesia Postprocedure Evaluation (Signed)
Anesthesia Post Note  Patient: Joe Hawkins  Procedure(s) Performed: ATRIAL FIBRILLATION ABLATION     Patient location during evaluation: PACU Anesthesia Type: General Level of consciousness: awake and alert and oriented Pain management: pain level controlled Vital Signs Assessment: post-procedure vital signs reviewed and stable Respiratory status: spontaneous breathing, nonlabored ventilation and respiratory function stable Cardiovascular status: blood pressure returned to baseline and stable Postop Assessment: no apparent nausea or vomiting Anesthetic complications: no   There were no known notable events for this encounter.  Last Vitals:  Vitals:   12/31/22 1319 12/31/22 1324  BP: (!) 102/53   Pulse: 87   Resp: 15   Temp:  36.6 C  SpO2: 92%     Last Pain:  Vitals:   12/31/22 1324  TempSrc: Temporal  PainSc:                  [place] A.

## 2022-12-31 NOTE — Discharge Instructions (Signed)

## 2022-12-31 NOTE — Anesthesia Procedure Notes (Signed)
Procedure Name: Intubation Date/Time: 12/31/2022 10:53 AM  Performed by: Colin Benton, CRNAPre-anesthesia Checklist: Patient identified, Emergency Drugs available, Suction available and Patient being monitored Patient Re-evaluated:Patient Re-evaluated prior to induction Oxygen Delivery Method: Circle system utilized Preoxygenation: Pre-oxygenation with 100% oxygen Induction Type: IV induction Ventilation: Mask ventilation without difficulty Laryngoscope Size: Glidescope and 3 Grade View: Grade I Tube type: Oral Tube size: 7.5 mm Number of attempts: 2 Airway Equipment and Method: Rigid stylet and Video-laryngoscopy Placement Confirmation: ETT inserted through vocal cords under direct vision, positive ETCO2 and breath sounds checked- equal and bilateral Secured at: 23 cm Tube secured with: Tape Dental Injury: Teeth and Oropharynx as per pre-operative assessment  Comments: DL x 1 with Mil 2.  Grade 2b view but unable to pass ETT.  Easy BMV.  DL x 2 with glidescope.  EBBS and VSS.

## 2023-01-01 ENCOUNTER — Encounter (HOSPITAL_COMMUNITY): Payer: Self-pay | Admitting: Cardiovascular Disease

## 2023-01-07 ENCOUNTER — Telehealth: Payer: Self-pay | Admitting: Cardiovascular Disease

## 2023-01-07 NOTE — Telephone Encounter (Signed)
Returned call to patient's friend Research scientist (medical) (OK per DPR), patient and friend were both on the phone.  Explained to patient and Lucious Groves that it is not unsual to have a small lump at ablation site, and it can take up to 3 months before it resolves. Reviewed s/sx of infection to report (redness, swelling, warmth, drainage, pain). Patient denies any s/sx of infection at present and verbalized understanding of when to call clinic.  No further questions at this time, patient and [place] expressed appreciation for call.

## 2023-01-07 NOTE — Telephone Encounter (Signed)
Patient's friend is calling to report the pt has a hard ball on the side of where his ablation was performed. She reports it is bruised and appeared two days ago. Please advise.

## 2023-01-18 DIAGNOSIS — M5416 Radiculopathy, lumbar region: Secondary | ICD-10-CM | POA: Diagnosis not present

## 2023-01-18 DIAGNOSIS — Z6834 Body mass index (BMI) 34.0-34.9, adult: Secondary | ICD-10-CM | POA: Diagnosis not present

## 2023-01-21 ENCOUNTER — Encounter: Payer: Self-pay | Admitting: Emergency Medicine

## 2023-01-21 ENCOUNTER — Ambulatory Visit (INDEPENDENT_AMBULATORY_CARE_PROVIDER_SITE_OTHER): Payer: 59 | Admitting: Emergency Medicine

## 2023-01-21 VITALS — BP 124/78 | HR 56 | Temp 98.0°F | Ht 68.0 in | Wt 245.0 lb

## 2023-01-21 DIAGNOSIS — R0989 Other specified symptoms and signs involving the circulatory and respiratory systems: Secondary | ICD-10-CM

## 2023-01-21 DIAGNOSIS — J22 Unspecified acute lower respiratory infection: Secondary | ICD-10-CM

## 2023-01-21 DIAGNOSIS — R051 Acute cough: Secondary | ICD-10-CM | POA: Diagnosis not present

## 2023-01-21 MED ORDER — AZITHROMYCIN 250 MG PO TABS: ORAL_TABLET | ORAL | 0 refills | Status: DC

## 2023-01-21 MED ORDER — BENZONATATE 200 MG PO CAPS: 200.0000 mg | ORAL_CAPSULE | Freq: Two times a day (BID) | ORAL | 0 refills | Status: DC | PRN

## 2023-01-21 MED ORDER — HYDROCODONE BIT-HOMATROP MBR 5-1.5 MG/5ML PO SOLN: 5.0000 mL | Freq: Every evening | ORAL | 0 refills | Status: DC | PRN

## 2023-01-21 NOTE — Patient Instructions (Signed)

## 2023-01-21 NOTE — Progress Notes (Signed)
Joe Hawkins 62 y.o.   Chief Complaint  Patient presents with   Cough    And runny nose started 5 days ago tried otc meds and hasn't cleared up     HISTORY OF PRESENT ILLNESS: Acute problem visit today.  Patient of Dr. Walker Kehr This is a 62 y.o. male complaining of runny nose and productive cough that started about 6 -7 days ago and is progressively getting worse. Denies difficulty breathing.  Denies high fever or chill.  Denies chest pain. No other associated symptoms. No other complaints or medical concerns today.  Cough Associated symptoms include a sore throat. Pertinent negatives include no chest pain, chills, fever, headaches, rash or shortness of breath.     Prior to Admission medications   Medication Sig Start Date End Date Taking? Authorizing Provider  acetaminophen (TYLENOL) 500 MG tablet Take 500-1,000 mg by mouth every 6 (six) hours as needed (pain.).   Yes [provider]  apixaban (ELIQUIS) 5 MG TABS tablet Take 1 tablet (5 mg total) by mouth 2 (two) times daily. 08/21/22  Yes Sherran Needs, NP  EPINEPHrine (EPIPEN) 0.3 mg/0.3 mL DEVI Inject 0.3 mLs (0.3 mg total) into the muscle once. 05/21/12  Yes Rowe Clack, MD  fluticasone (FLONASE) 50 MCG/ACT nasal spray Place 2 sprays into both nostrils daily. Patient taking differently: Place 2 sprays into both nostrils daily as needed for allergies. 05/05/21  Yes Hoyt Koch, MD  gabapentin (NEURONTIN) 100 MG capsule Take 1-2 capsules (100-200 mg total) by mouth at bedtime. 11/15/22  Yes Plotnikov, Evie Lacks, MD  levocetirizine (XYZAL) 5 MG tablet Take 1 tablet (5 mg total) by mouth daily as needed for allergies. 10/02/22  Yes Plotnikov, Evie Lacks, MD  methylPREDNISolone (MEDROL DOSEPAK) 4 MG TBPK tablet As directed 11/15/22  Yes Plotnikov, Evie Lacks, MD  pantoprazole (PROTONIX) 40 MG tablet Take 1 tablet (40 mg total) by mouth daily. 12/31/22 02/14/23 Yes Tillery, Satira Mccallum, PA-C  Polyethyl  Glycol-Propyl Glycol (SYSTANE) 0.4-0.3 % SOLN Place 1-2 drops into both eyes 3 (three) times daily as needed (dry/irritated eyes.).   Yes [provider]  triamcinolone cream (KENALOG) 0.5 % Apply 1 Application topically 3 (three) times daily. Patient taking differently: Apply 1 Application topically 3 (three) times daily as needed (rash/itching). 08/09/22 08/09/23 Yes Plotnikov, Evie Lacks, MD  Trolamine Salicylate (BLUE-EMU MAXIMUM PAIN RELIEF EX) Apply 1 Application topically 3 (three) times daily as needed (pain.).   Yes [provider]  colchicine 0.6 MG tablet Take 1 tablet (0.6 mg total) by mouth 2 (two) times daily for 5 days. 12/31/22 01/05/23  Shirley Friar, PA-C    Allergies  Allergen Reactions   Shellfish Allergy Itching    Crab, lobsters and shrimp.     Patient Active Problem List   Diagnosis Date Noted   Neuropathic pain 11/15/2022   Atrial fibrillation ([place]) 08/09/2022   Colon polyp 08/09/2022   Contact dermatitis 08/09/2022   Colonic mass 05/08/2022   Dry mouth 03/29/2022   LLQ abdominal pain 03/12/2022   Lumbar radiculopathy 12/26/2021   Left lumbar radiculopathy 10/01/2021   Personal history of colonic polyps 05/05/2021   Other fatigue 05/05/2021   Post covid-19 condition, unspecified 05/05/2021   Rash 02/22/2020   Allergic conjunctivitis 02/22/2020   Arthritis, multiple joint involvement 09/14/2019   Lumbar radiculitis 07/01/2018   Pharyngitis 10/03/2017   Lymphadenopathy of left cervical region 10/03/2017   Eosinophilia 10/03/2017   Acute sinus infection 11/24/2015   Itching 11/11/2013  Visual disturbance 09/09/2012   Pterygium eye 09/09/2012   Facial swelling 06/21/2012   Eczema 05/07/2012   Constipation 04/27/2011   LYMPHADENITIS 11/29/2010   MIGRAINE HEADACHE 11/10/2010   OTITIS MEDIA 04/28/2010   TINNITUS 03/23/2010   Meralgia paresthetica 02/10/2010   NECK PAIN 09/01/2009   TOBACCO USE, QUIT 09/01/2009   GERD 12/08/2008    Sinusitis, chronic 10/31/2007   Allergic rhinitis 10/31/2007   LOW BACK PAIN 10/31/2007   Seasonal allergic rhinitis due to pollen 10/31/2007    Past Medical History:  Diagnosis Date   A-fib [facility])    Allergic rhinitis    GERD (gastroesophageal reflux disease)    Low back pain    Sinusitis    Dr Constance Holster   Tinnitus 11/19/2009    Past Surgical History:  Procedure Laterality Date   ATRIAL FIBRILLATION ABLATION N/A 12/31/2022   Procedure: ATRIAL FIBRILLATION ABLATION;  Surgeon: Melida Quitter, MD;  Location: [place] CV LAB;  Service: Cardiovascular;  Laterality: N/A;   CARDIOVERSION N/A 09/12/2022   Procedure: CARDIOVERSION;  Surgeon: [name], MD;  Location: [facility] ENDOSCOPY;  Service: Cardiovascular;  Laterality: N/A;   CATARACT EXTRACTION     LUMBAR [place] SURGERY  11/19/2005   NASAL SINUS SURGERY  11/19/2006   TRANSFORAMINAL LUMBAR INTERBODY FUSION W/ MIS 1 LEVEL Left 12/26/2021   Procedure: Minimally Invasive Transforaminal Lumbar Interbody Fusion, Left Lumbar [id number];  Surgeon: Vallarie Mare, MD;  Location: [place];  Service: Neurosurgery;  Laterality: Left;  Minimally Invasive Transforaminal Lumbar Interbody Fusion, Left Lumbar Four-Five45    Social History   Socioeconomic History   Marital status: Significant Other    Spouse name: Not on file   Number of children: 4   Years of education: Not on file   Highest education level: Not on file  Occupational History   Occupation: Archivist  Tobacco Use   Smoking status: Former    Types: Cigarettes    Quit date: 2014    Years since quitting: 10.1   Smokeless tobacco: Never   Tobacco comments:    Former smoker 08/21/22  Vaping Use   Vaping Use: Never used  Substance and Sexual Activity   Alcohol use: Yes    Comment: 1 per week 08/21/22   Drug use: No   Sexual activity: Not Currently  Other Topics Concern   Not on file  Social History Narrative   Married   Social Determinants of Health    Financial Resource Strain: Not on file  Food Insecurity: Not on file  Transportation Needs: Not on file  Physical Activity: Not on file  Stress: Not on file  Social Connections: Not on file  Intimate Partner Violence: Not on file    Family History  Problem Relation Age of Onset   Cancer Neg Hx      Review of Systems  Constitutional: Negative.  Negative for chills and fever.  HENT:  Positive for congestion and sore throat.   Respiratory:  Positive for cough and sputum production. Negative for shortness of breath.   Cardiovascular: Negative.  Negative for chest pain and palpitations.  Gastrointestinal:  Negative for abdominal pain, nausea and vomiting.  Genitourinary: Negative.   Skin: Negative.  Negative for rash.  Neurological: Negative.  Negative for dizziness and headaches.  All other systems reviewed and are negative.  Today's Vitals   01/21/23 0834  BP: 124/78  Pulse: (!) 56  Temp: 98 F (36.7 C)  TempSrc: Oral  SpO2: 96%  Weight: 245 lb (111.1  kg)  Height: '5\' 8"'$  (1.727 m)   Body mass index is 37.25 kg/m.   Physical Exam Vitals reviewed.  Constitutional:      Appearance: Normal appearance.  HENT:     Head: Normocephalic.     Right Ear: Tympanic membrane, ear canal and external ear normal.     Left Ear: Tympanic membrane, ear canal and external ear normal.     Mouth/Throat:     Mouth: Mucous membranes are moist.     Pharynx: Oropharynx is clear.  Eyes:     Extraocular Movements: Extraocular movements intact.     Conjunctiva/sclera: Conjunctivae normal.     Pupils: Pupils are equal, round, and reactive to light.  Cardiovascular:     Rate and Rhythm: Normal rate and regular rhythm.     Pulses: Normal pulses.     Heart sounds: Normal heart sounds.  Pulmonary:     Effort: Pulmonary effort is normal.     Breath sounds: Normal breath sounds.  Abdominal:     Palpations: Abdomen is soft.     Tenderness: There is no abdominal tenderness.   Musculoskeletal:     Cervical back: No tenderness.  Lymphadenopathy:     Cervical: No cervical adenopathy.  Skin:    General: Skin is warm and dry.     Capillary Refill: Capillary refill takes less than 2 seconds.  Neurological:     General: No focal deficit present.     Mental Status: He is alert and oriented to person, place, and time.  Psychiatric:        Mood and Affect: Mood normal.        Behavior: Behavior normal.      ASSESSMENT & PLAN: A total of 32 minutes was spent with the patient and counseling/coordination of care regarding preparing for this visit, review of most recent office visit notes, review of multiple chronic medical conditions under management, review of all medications, diagnosis of lower respiratory infection and need for antibiotics, cough management, prognosis, documentation, need for follow-up.  Problem List Items Addressed This Visit       Respiratory   Lower respiratory infection - Primary    Viral upper respiratory infection now complicated by secondary bacterial infection.  Recommend daily azithromycin for 5 days. Advised to rest and stay well-hydrated. Clinically stable.  No signs of pneumonia.  No red flag signs or symptoms Advised to contact the office if no better or worse during the next several days.      Relevant Medications   azithromycin (ZITHROMAX) 250 MG tablet   Chest congestion    Continue over-the-counter Mucinex DM. Advised to stay well-hydrated      Relevant Medications   HYDROcodone bit-homatropine (HYCODAN) 5-1.5 MG/5ML syrup     Other   Acute cough    Cough management discussed. Continue over-the-counter Mucinex DM and cough drops Start Tessalon 200 mg 3 times a day Hycodan syrup at bedtime as needed      Relevant Medications   benzonatate (TESSALON) 200 MG capsule   HYDROcodone bit-homatropine (HYCODAN) 5-1.5 MG/5ML syrup   Patient Instructions  Cough, Adult A cough helps to clear your throat and lungs. It  may be a sign of an illness or another condition. A short-term (acute) cough may last 2-3 weeks. A long-term (chronic) cough may last 8 or more weeks. Many things can cause a cough. They include: Illnesses such as: An infection in your throat or lungs. Asthma or other heart or lung problems. Gastroesophageal reflux. This  is when acid comes back up from your stomach. Breathing in things that bother (irritate) your lungs. Allergies. Postnasal drip. This is when mucus runs down the back of your throat. Smoking. Some medicines. Follow these instructions at home: Medicines Take over-the-counter and prescription medicines only as told by your doctor. Talk with your doctor before you take cough medicine (cough suppressants). Eating and drinking Do not drink alcohol. Do not drink caffeine. Drink enough fluid to keep your pee (urine) pale yellow. Lifestyle Stay away from cigarette smoke. Do not smoke or use any products that contain nicotine or tobacco. If you need help quitting, ask your doctor. Stay away from things that make you cough. These may include perfume, candles, cleaning products, or campfire smoke. General instructions  Watch for any changes to your cough. Tell your doctor about them. Always cover your mouth when you cough. If the air is dry in your home, use a cool mist vaporizer or humidifier. If your cough is worse at night, try using extra pillows to raise your head up higher while you sleep. Rest as needed. Contact a doctor if: You have new symptoms. Your symptoms get worse. You cough up pus. You have a fever that does not go away. Your cough does not get better after 2-3 weeks. Cough medicine does not help, and you are not sleeping well. You have pain that gets worse or is not helped with medicine. You are losing weight and do not know why. You have night sweats. Get help right away if: You cough up blood. You have trouble breathing. Your heart is beating very  fast. These symptoms may be an emergency. Get help right away. Call 911. Do not wait to see if the symptoms will go away. Do not drive yourself to the hospital. This information is not intended to replace advice given to you by your health care provider. Make sure you discuss any questions you have with your health care provider. Document Revised: 07/06/2022 Document Reviewed: 07/06/2022 Elsevier Patient Education  [place], MD [facility]

## 2023-01-21 NOTE — Assessment & Plan Note (Signed)
Continue over-the-counter Mucinex DM. Advised to stay well-hydrated

## 2023-01-21 NOTE — Assessment & Plan Note (Signed)
Cough management discussed. Continue over-the-counter Mucinex DM and cough drops Start Tessalon 200 mg 3 times a day Hycodan syrup at bedtime as needed

## 2023-01-21 NOTE — Assessment & Plan Note (Signed)
Viral upper respiratory infection now complicated by secondary bacterial infection.  Recommend daily azithromycin for 5 days. Advised to rest and stay well-hydrated. Clinically stable.  No signs of pneumonia.  No red flag signs or symptoms Advised to contact the office if no better or worse during the next several days.

## 2023-01-28 ENCOUNTER — Ambulatory Visit (HOSPITAL_COMMUNITY)
Admission: RE | Admit: 2023-01-28 | Discharge: 2023-01-28 | Disposition: A | Discharge status: Home / Self Care | Payer: 59 | Source: Ambulatory Visit | Attending: Physician Assistant | Admitting: Physician Assistant

## 2023-01-28 VITALS — BP 114/96 | HR 90 | Ht 68.0 in | Wt 244.4 lb

## 2023-01-28 DIAGNOSIS — I358 Other nonrheumatic aortic valve disorders: Secondary | ICD-10-CM | POA: Insufficient documentation

## 2023-01-28 DIAGNOSIS — I4819 Other persistent atrial fibrillation: Secondary | ICD-10-CM

## 2023-01-28 NOTE — Patient Instructions (Addendum)
Cardioversion scheduled for:  Tuesday, March 26th   - Arrive at the [facility] and go to admitting at 10am,   - Do not eat or drink anything after midnight the night prior to your procedure.   - Take all your morning medication (except diabetic medications) with a sip of water prior to arrival.  - You will not be able to drive home after your procedure.    - Do NOT miss any doses of your blood thinner - if you should miss a dose please notify our office immediately.   - If you feel as if you go back into normal rhythm prior to scheduled cardioversion, please notify our office immediately.   If your procedure is canceled in the cardioversion suite you will be charged a cancellation fee.

## 2023-01-28 NOTE — Progress Notes (Signed)
Primary Care Physician: Cassandria Anger, MD Referring Physician: Dr. Havery Moros  Primary EP: Dr Jodene Nam is a 62 y.o. male with a h/o Migraine h/a's and allergies, that presented for colonoscopy yesterday  for abdominal pain and was found to be in afib with rate controlled, asymptomatic. Procedure was cancelled and he was asked to come here for appointment this am. 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 He is here today with his wife and an interpretor. His wife speaks English very well. His ekg today continues to  show afib, rate controlled in the 80's. He is still unaware.   He drinks very rare alcohol, no tobacco. Minimal caffeine.  His wife indicates that he snores with witnessed apnea. She states he has not slept well in some time.   I started him on low dose metoprolol and eliquis 5 mg bid for a CHA2DS2VASc  score of 0, in case cardioversion was needed.   After he left, I found a phone note form Dr. Havery Moros that stated he had a colonic mass and would need colonoscopy as asap.   I called the wife and told her not to take the eliquis( he had taken one already) but  continue the low dose BB. He was scheduled to see me back in one week. He is now scheduled to get an echo at 8 am and I will see at 9 am after the echo. If all looks acceptable, he then can reschedule colonoscopy.   F/u one week 07/26/22. He was started on low dose BB with afib at 61 bpm but with a BP of 86/62 but for now will stop BB as I fear with colonic prep he may get syncopal. He had good rate control with afib on last visit with HR's in the 80's. Was hoping that possibly start of BB may help to restore SR. He had an echo earlier today and waiting the results. If unremarkable he go proceed to colonoscopy and restoring SR will be later addressed. He appears to be asymptomatic.   Follow up in the AF clinic 08/21/22. An interpreter was used for this visit. Patient remains in rate  controlled afib and is unaware of his arrhythmia. He resumed Eliquis but has missed 3 doses. No bleeding issues on anticoagulation.   F/u in afib clinic, 09/19/22. He had a successful cardioversion but found to have ERAF today. He is rate controlled and asymptomatic. He is here by himself today but speaks English very well.   Follow up in the AF clinic 01/28/23. Patient is s/p afib ablation with Dr Myles Gip on 12/31/22. Successful ablation without complication. He has been compliant with Eliquis, no missed doses, no bleeding concerns. He denies any chest pain, shortness of breath, or trouble swallowing. Leg incision sites healed without issue. Historically, he is unable to tell when he is in Afib. He is in rate controlled afib today.   Today, he denies symptoms of palpitations, chest pain, shortness of breath, orthopnea, PND, lower extremity edema, dizziness, presyncope, syncope, or neurologic sequela.  Past Medical History:  Diagnosis Date   A-fib ([place])    Allergic rhinitis    GERD (gastroesophageal reflux disease)    Low back pain    Sinusitis    Dr Constance Holster   Tinnitus 11/19/2009   Past Surgical History:  Procedure Laterality Date   ATRIAL FIBRILLATION ABLATION N/A 12/31/2022   Procedure: ATRIAL FIBRILLATION ABLATION;  Surgeon: Melida Quitter, MD;  Location: [place] CV LAB;  Service: Cardiovascular;  Laterality: N/A;   CARDIOVERSION N/A 09/12/2022   Procedure: CARDIOVERSION;  Surgeon: [name], MD;  Location: [facility] ENDOSCOPY;  Service: Cardiovascular;  Laterality: N/A;   CATARACT EXTRACTION     LUMBAR [place] SURGERY  11/19/2005   NASAL [place] SURGERY  11/19/2006   TRANSFORAMINAL LUMBAR INTERBODY FUSION W/ MIS 1 LEVEL Left 12/26/2021   Procedure: Minimally Invasive Transforaminal Lumbar Interbody Fusion, Left Lumbar [id number];  Surgeon: Vallarie Mare, MD;  Location: [place];  Service: Neurosurgery;  Laterality: Left;  Minimally Invasive Transforaminal Lumbar Interbody Fusion,  Left Lumbar Four-Five45    Current Outpatient Medications  Medication Sig Dispense Refill   acetaminophen (TYLENOL) 500 MG tablet Take 500-1,000 mg by mouth every 6 (six) hours as needed (pain.).     apixaban (ELIQUIS) 5 MG TABS tablet Take 1 tablet (5 mg total) by mouth 2 (two) times daily. 60 tablet 3   benzonatate (TESSALON) 200 MG capsule Take 1 capsule (200 mg total) by mouth 2 (two) times daily as needed for cough. 20 capsule 0   EPINEPHrine (EPIPEN) 0.3 mg/0.3 mL DEVI Inject 0.3 mLs (0.3 mg total) into the muscle once. 1 Device 0   fluticasone (FLONASE) 50 MCG/ACT nasal spray Place 2 sprays into both nostrils daily. (Patient taking differently: Place 2 sprays into both nostrils daily as needed for allergies.) 16 g 6   gabapentin (NEURONTIN) 100 MG capsule Take 1-2 capsules (100-200 mg total) by mouth at bedtime. 60 capsule 3   HYDROcodone bit-homatropine (HYCODAN) 5-1.5 MG/5ML syrup Take 5 mLs by mouth at bedtime as needed for cough. 120 mL 0   levocetirizine (XYZAL) 5 MG tablet Take 1 tablet (5 mg total) by mouth daily as needed for allergies. 30 tablet 5   meloxicam (MOBIC) 15 MG tablet Take 15 mg by mouth as needed.     Polyethyl Glycol-Propyl Glycol (SYSTANE) 0.4-0.3 % SOLN Place 1-2 drops into both eyes 3 (three) times daily as needed (dry/irritated eyes.).     triamcinolone cream (KENALOG) 0.5 % Apply 1 Application topically 3 (three) times daily. (Patient taking differently: Apply 1 Application topically 3 (three) times daily as needed (rash/itching).) 45 g 1   Trolamine Salicylate (BLUE-EMU MAXIMUM PAIN RELIEF EX) Apply 1 Application topically 3 (three) times daily as needed (pain.).     No current facility-administered medications for this encounter.    Allergies  Allergen Reactions   Shellfish Allergy Itching    Crab, lobsters and shrimp.     Social History   Socioeconomic History   Marital status: Significant Other    Spouse name: Not on file   Number of children: 4    Years of education: Not on file   Highest education level: Not on file  Occupational History   Occupation: Archivist  Tobacco Use   Smoking status: Former    Types: Cigarettes    Quit date: 2014    Years since quitting: 10.1   Smokeless tobacco: Never   Tobacco comments:    Former smoker 08/21/22  Vaping Use   Vaping Use: Never used  Substance and Sexual Activity   Alcohol use: Yes    Comment: 1 per week 08/21/22   Drug use: No   Sexual activity: Not Currently  Other Topics Concern   Not on file  Social History Narrative   Married   Social Determinants of Health   Financial Resource Strain: Not on file  Food Insecurity: Not on file  Transportation  Needs: Not on file  Physical Activity: Not on file  Stress: Not on file  Social Connections: Not on file  Intimate Partner Violence: Not on file    Family History  Problem Relation Age of Onset   Cancer Neg Hx     ROS- All systems are reviewed and negative except as per the HPI above  Physical Exam: Vitals:   01/28/23 1314  BP: (!) 114/96  Pulse: 90  Weight: 110.9 kg  Height: '5\' 8"'$  (1.727 m)    Wt Readings from Last 3 Encounters:  01/28/23 110.9 kg  01/21/23 111.1 kg  12/31/22 104.3 kg    Labs: Lab Results  Component Value Date   NA 142 12/10/2022   K 4.2 12/10/2022   CL 103 12/10/2022   CO2 23 12/10/2022   GLUCOSE 112 (H) 12/10/2022   BUN 14 12/10/2022   CREATININE 1.10 12/10/2022   CALCIUM 9.3 12/10/2022   No results found for: "INR" Lab Results  Component Value Date   CHOL 195 05/05/2021   HDL 47.60 05/05/2021   LDLCALC 117 (H) 05/05/2021   TRIG 154.0 (H) 05/05/2021    GEN- The patient is a well appearing male, alert and oriented x 3 today.   HEENT-head normocephalic, atraumatic, sclera clear, conjunctiva pink, hearing intact, trachea midline. Lungs- Clear to ausculation bilaterally, normal work of breathing Heart- Irregular rate and rhythm, no murmurs, rubs or gallops  GI- soft, NT, ND,  + BS Extremities- no clubbing, cyanosis, or edema MS- no significant deformity or atrophy Skin- no rash or lesion Psych- euthymic mood, full affect Neuro- strength and sensation are intact   ECG today demonstrates Atrial fibrillation HR 90    Echo 07/26/22  1. Left ventricular ejection fraction, by estimation, is 55 to 60%. The left ventricle has normal function. The left ventricle has no regional wall motion abnormalities. Left ventricular diastolic parameters are indeterminate.   2. Right ventricular systolic function is mildly reduced. The right ventricular size is mildly enlarged. There is normal pulmonary artery systolic pressure. The estimated right ventricular systolic pressure is 99991111 mmHg.   3. Left atrial size was mildly dilated.   4. The mitral valve is normal in structure. No evidence of mitral valve regurgitation.   5. The aortic valve is tricuspid. Aortic valve regurgitation is not  visualized. Aortic valve sclerosis/calcification is present, without any evidence of aortic stenosis.   6. The inferior vena cava is normal in size with <50% respiratory variability, suggesting right atrial pressure of 8 mmHg.    CHA2DS2-VASc Score = 0  The patient's score is based upon: CHF History: 0 HTN History: 0 Diabetes History: 0 Stroke History: 0 Vascular Disease History: 0 Age Score: 0 Gender Score: 0       ASSESSMENT AND PLAN: 1. Persistent Atrial Fibrillation (ICD10:  I48.19) The patient's CHA2DS2-VASc score is 0, indicating a 0.2% annual risk of stroke.   S/p ablation on 12/31/22  Unfortunately, he is in Afib today.   Continue Eliquis 5 mg BID with no missed doses for 3 months post ablation.  He did not tolerate low dose BB with significant bradycardia and hypotension   We discussed a plan to schedule DCCV and prior to it obtain labs and repeat ECG. We did explain if this is unsuccessful in returning him to SR then can consider AADs (multaq or tikosyn).   Schedule DCCV  - we will plan to obtain labs and repeat ECG prior. Follow up 1-2 weeks post DCCV.   Bear Stearns  [facility] PA-C Afib [facility] [address] [place], [place] [address] [phone]

## 2023-01-28 NOTE — H&P (View-Only) (Signed)
 Primary Care Physician: Plotnikov, Aleksei V, MD Referring Physician: Dr. Armbruster  Primary EP: Dr Mealor   Joe Hawkins is a 62 y.o. male with a h/o Migraine h/a's and allergies, that presented for colonoscopy yesterday  for abdominal pain and was found to be in afib with rate controlled, asymptomatic. Procedure was cancelled and he was asked to come here for appointment this am. 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 He is here today with his wife and an interpretor. His wife speaks English very well. His ekg today continues to  show afib, rate controlled in the 80's. He is still unaware.   He drinks very rare alcohol, no tobacco. Minimal caffeine.  His wife indicates that he snores with witnessed apnea. She states he has not slept well in some time.   I started him on low dose metoprolol and eliquis 5 mg bid for a CHA2DS2VASc  score of 0, in case cardioversion was needed.   After he left, I found a phone note form Dr. Armbruster that stated he had a colonic mass and would need colonoscopy as asap.   I called the wife and told her not to take the eliquis( he had taken one already) but  continue the low dose BB. He was scheduled to see me back in one week. He is now scheduled to get an echo at 8 am and I will see at 9 am after the echo. If all looks acceptable, he then can reschedule colonoscopy.   F/u one week 07/26/22. He was started on low dose BB with afib at 61 bpm but with a BP of 86/62 but for now will stop BB as I fear with colonic prep he may get syncopal. He had good rate control with afib on last visit with HR's in the 80's. Was hoping that possibly start of BB may help to restore SR. He had an echo earlier today and waiting the results. If unremarkable he go proceed to colonoscopy and restoring SR will be later addressed. He appears to be asymptomatic.   Follow up in the AF clinic 08/21/22. An interpreter was used for this visit. Patient remains in rate  controlled afib and is unaware of his arrhythmia. He resumed Eliquis but has missed 3 doses. No bleeding issues on anticoagulation.   F/u in afib clinic, 09/19/22. He had a successful cardioversion but found to have ERAF today. He is rate controlled and asymptomatic. He is here by himself today but speaks English very well.   Follow up in the AF clinic 01/28/23. Patient is s/p afib ablation with Dr Mealor on 12/31/22. Successful ablation without complication. He has been compliant with Eliquis, no missed doses, no bleeding concerns. He denies any chest pain, shortness of breath, or trouble swallowing. Leg incision sites healed without issue. Historically, he is unable to tell when he is in Afib. He is in rate controlled afib today.   Today, he denies symptoms of palpitations, chest pain, shortness of breath, orthopnea, PND, lower extremity edema, dizziness, presyncope, syncope, or neurologic sequela.  Past Medical History:  Diagnosis Date   A-fib (HCC)    Allergic rhinitis    GERD (gastroesophageal reflux disease)    Low back pain    Sinusitis    Dr Rosen   Tinnitus 11/19/2009   Past Surgical History:  Procedure Laterality Date   ATRIAL FIBRILLATION ABLATION N/A 12/31/2022   Procedure: ATRIAL FIBRILLATION ABLATION;  Surgeon: Mealor, Augustus E, MD;    Location: [facility]VASIVE CV [facility];  Service: Cardiovascular;  Laterality: N/A;   CARDIOVERSION N/A 09/12/2022   Procedure: CARDIOVERSION;  Surgeon: Pemberton, Heather E, MD;  Location: [facility] ENDOSCOPY;  Service: Cardiovascular;  Laterality: N/A;   CATARACT EXTRACTION     LUMBAR DISC SURGERY  11/19/2005   NASAL SINUS SURGERY  11/19/2006   TRANSFORAMINAL LUMBAR INTERBODY FUSION W/ MIS 1 LEVEL Left 12/26/2021   Procedure: Minimally Invasive Transforaminal Lumbar Interbody Fusion, Left Lumbar Four-Five45;  Surgeon: Thomas, Jonathan G, MD;  Location: [facility] OR;  Service: Neurosurgery;  Laterality: Left;  Minimally Invasive Transforaminal Lumbar Interbody Fusion,  Left Lumbar Four-Five45    Current Outpatient Medications  Medication Sig Dispense Refill   acetaminophen (TYLENOL) 500 MG tablet Take 500-1,000 mg by mouth every 6 (six) hours as needed (pain.).     apixaban (ELIQUIS) 5 MG TABS tablet Take 1 tablet (5 mg total) by mouth 2 (two) times daily. 60 tablet 3   benzonatate (TESSALON) 200 MG capsule Take 1 capsule (200 mg total) by mouth 2 (two) times daily as needed for cough. 20 capsule 0   EPINEPHrine (EPIPEN) 0.3 mg/0.3 mL DEVI Inject 0.3 mLs (0.3 mg total) into the muscle once. 1 Device 0   fluticasone (FLONASE) 50 MCG/ACT nasal spray Place 2 sprays into both nostrils daily. (Patient taking differently: Place 2 sprays into both nostrils daily as needed for allergies.) 16 g 6   gabapentin (NEURONTIN) 100 MG capsule Take 1-2 capsules (100-200 mg total) by mouth at bedtime. 60 capsule 3   HYDROcodone bit-homatropine (HYCODAN) 5-1.5 MG/5ML syrup Take 5 mLs by mouth at bedtime as needed for cough. 120 mL 0   levocetirizine (XYZAL) 5 MG tablet Take 1 tablet (5 mg total) by mouth daily as needed for allergies. 30 tablet 5   meloxicam (MOBIC) 15 MG tablet Take 15 mg by mouth as needed.     Polyethyl Glycol-Propyl Glycol (SYSTANE) 0.4-0.3 % SOLN Place 1-2 drops into both eyes 3 (three) times daily as needed (dry/irritated eyes.).     triamcinolone cream (KENALOG) 0.5 % Apply 1 Application topically 3 (three) times daily. (Patient taking differently: Apply 1 Application topically 3 (three) times daily as needed (rash/itching).) 45 g 1   Trolamine Salicylate (BLUE-EMU MAXIMUM PAIN RELIEF EX) Apply 1 Application topically 3 (three) times daily as needed (pain.).     No current facility-administered medications for this encounter.    Allergies  Allergen Reactions   Shellfish Allergy Itching    Crab, lobsters and shrimp.     Social History   Socioeconomic History   Marital status: Significant Other    Spouse name: Not on file   Number of children: 4    Years of education: Not on file   Highest education level: Not on file  Occupational History   Occupation: jeweler  Tobacco Use   Smoking status: Former    Types: Cigarettes    Quit date: 2014    Years since quitting: 10.1   Smokeless tobacco: Never   Tobacco comments:    Former smoker 08/21/22  Vaping Use   Vaping Use: Never used  Substance and Sexual Activity   Alcohol use: Yes    Comment: 1 per week 08/21/22   Drug use: No   Sexual activity: Not Currently  Other Topics Concern   Not on file  Social History Narrative   Married   Social Determinants of Health   Financial Resource Strain: Not on file  Food Insecurity: Not on file  Transportation   Needs: Not on file  Physical Activity: Not on file  Stress: Not on file  Social Connections: Not on file  Intimate Partner Violence: Not on file    Family History  Problem Relation Age of Onset   Cancer Neg Hx     ROS- All systems are reviewed and negative except as per the HPI above  Physical Exam: Vitals:   01/28/23 1314  BP: (!) 114/96  Pulse: 90  Weight: 110.9 kg  Height: 5' 8" (1.727 m)    Wt Readings from Last 3 Encounters:  01/28/23 110.9 kg  01/21/23 111.1 kg  12/31/22 104.3 kg    Labs: Lab Results  Component Value Date   NA 142 12/10/2022   K 4.2 12/10/2022   CL 103 12/10/2022   CO2 23 12/10/2022   GLUCOSE 112 (H) 12/10/2022   BUN 14 12/10/2022   CREATININE 1.10 12/10/2022   CALCIUM 9.3 12/10/2022   No results found for: "INR" Lab Results  Component Value Date   CHOL 195 05/05/2021   HDL 47.60 05/05/2021   LDLCALC 117 (H) 05/05/2021   TRIG 154.0 (H) 05/05/2021    GEN- The patient is a well appearing male, alert and oriented x 3 today.   HEENT-head normocephalic, atraumatic, sclera clear, conjunctiva pink, hearing intact, trachea midline. Lungs- Clear to ausculation bilaterally, normal work of breathing Heart- Irregular rate and rhythm, no murmurs, rubs or gallops  GI- soft, NT, ND,  + BS Extremities- no clubbing, cyanosis, or edema MS- no significant deformity or atrophy Skin- no rash or lesion Psych- euthymic mood, full affect Neuro- strength and sensation are intact   ECG today demonstrates Atrial fibrillation HR 90    Echo 07/26/22  1. Left ventricular ejection fraction, by estimation, is 55 to 60%. The left ventricle has normal function. The left ventricle has no regional wall motion abnormalities. Left ventricular diastolic parameters are indeterminate.   2. Right ventricular systolic function is mildly reduced. The right ventricular size is mildly enlarged. There is normal pulmonary artery systolic pressure. The estimated right ventricular systolic pressure is 22.3 mmHg.   3. Left atrial size was mildly dilated.   4. The mitral valve is normal in structure. No evidence of mitral valve regurgitation.   5. The aortic valve is tricuspid. Aortic valve regurgitation is not  visualized. Aortic valve sclerosis/calcification is present, without any evidence of aortic stenosis.   6. The inferior vena cava is normal in size with <50% respiratory variability, suggesting right atrial pressure of 8 mmHg.    CHA2DS2-VASc Score = 0  The patient's score is based upon: CHF History: 0 HTN History: 0 Diabetes History: 0 Stroke History: 0 Vascular Disease History: 0 Age Score: 0 Gender Score: 0       ASSESSMENT AND PLAN: 1. Persistent Atrial Fibrillation (ICD10:  I48.19) The patient's CHA2DS2-VASc score is 0, indicating a 0.2% annual risk of stroke.   S/p ablation on 12/31/22  Unfortunately, he is in Afib today.   Continue Eliquis 5 mg BID with no missed doses for 3 months post ablation.  He did not tolerate low dose BB with significant bradycardia and hypotension   We discussed a plan to schedule DCCV and prior to it obtain labs and repeat ECG. We did explain if this is unsuccessful in returning him to SR then can consider AADs (multaq or tikosyn).   Schedule DCCV  - we will plan to obtain labs and repeat ECG prior. Follow up 1-2 weeks post DCCV.   Ricky   Ryot Burrous PA-C [facility] [address] [place], [place] [address] [phone]  

## 2023-02-11 ENCOUNTER — Ambulatory Visit (HOSPITAL_COMMUNITY)
Admission: RE | Admit: 2023-02-11 | Discharge: 2023-02-11 | Disposition: A | Discharge status: Home / Self Care | Payer: 59 | Source: Ambulatory Visit | Attending: Physician Assistant | Admitting: Physician Assistant

## 2023-02-11 VITALS — HR 111

## 2023-02-11 DIAGNOSIS — I4891 Unspecified atrial fibrillation: Secondary | ICD-10-CM | POA: Insufficient documentation

## 2023-02-11 LAB — BASIC METABOLIC PANEL
Anion gap: 12 (ref 5–15)
BUN: 11 mg/dL (ref 8–23)
CO2: 24 mmol/L (ref 22–32)
Calcium: 9.1 mg/dL (ref 8.9–10.3)
Chloride: 103 mmol/L (ref 98–111)
Creatinine, Ser: 1.2 mg/dL (ref 0.61–1.24)
GFR, Estimated: >60 mL/min (ref >60)
Glucose, Bld: 118 mg/dL — ABNORMAL HIGH (ref 70–99)
Potassium: 3.5 mmol/L (ref 3.5–5.1)
Sodium: 139 mmol/L (ref 135–145)

## 2023-02-11 LAB — CBC
HCT: 48.6 % (ref 39.0–52.0)
Hemoglobin: 15.7 g/dL (ref 13.0–17.0)
MCH: 25.7 pg — ABNORMAL LOW (ref 26.0–34.0)
MCHC: 32.3 g/dL (ref 30.0–36.0)
MCV: 79.5 fL — ABNORMAL LOW (ref 80.0–100.0)
Platelets: 218 10*3/uL (ref 150–400)
RBC: 6.11 MIL/uL — ABNORMAL HIGH (ref 4.22–5.81)
RDW: 12.9 % (ref 11.5–15.5)
WBC: 8.2 10*3/uL (ref 4.0–10.5)
nRBC: 0 % (ref 0.0–0.2)

## 2023-02-12 ENCOUNTER — Ambulatory Visit (HOSPITAL_COMMUNITY): Payer: 59 | Admitting: Anesthesiology

## 2023-02-12 ENCOUNTER — Other Ambulatory Visit: Payer: Self-pay

## 2023-02-12 ENCOUNTER — Ambulatory Visit (HOSPITAL_COMMUNITY)
Admission: RE | Admit: 2023-02-12 | Discharge: 2023-02-12 | Disposition: A | Discharge status: Home / Self Care | Payer: 59 | Source: Ambulatory Visit | Attending: Cardiology | Admitting: Cardiology

## 2023-02-12 ENCOUNTER — Encounter (HOSPITAL_COMMUNITY): Payer: Self-pay | Admitting: Cardiology

## 2023-02-12 ENCOUNTER — Ambulatory Visit (HOSPITAL_BASED_OUTPATIENT_CLINIC_OR_DEPARTMENT_OTHER): Payer: 59 | Admitting: Anesthesiology

## 2023-02-12 ENCOUNTER — Encounter (HOSPITAL_COMMUNITY)
Admission: RE | Disposition: A | Discharge status: Home / Self Care | Payer: Self-pay | Source: Ambulatory Visit | Attending: Cardiology

## 2023-02-12 DIAGNOSIS — E669 Obesity, unspecified: Secondary | ICD-10-CM | POA: Diagnosis not present

## 2023-02-12 DIAGNOSIS — Z7901 Long term (current) use of anticoagulants: Secondary | ICD-10-CM | POA: Diagnosis not present

## 2023-02-12 DIAGNOSIS — I4891 Unspecified atrial fibrillation: Secondary | ICD-10-CM | POA: Diagnosis not present

## 2023-02-12 DIAGNOSIS — Z87891 Personal history of nicotine dependence: Secondary | ICD-10-CM | POA: Diagnosis not present

## 2023-02-12 DIAGNOSIS — Z79899 Other long term (current) drug therapy: Secondary | ICD-10-CM | POA: Diagnosis not present

## 2023-02-12 DIAGNOSIS — Z6836 Body mass index (BMI) 36.0-36.9, adult: Secondary | ICD-10-CM | POA: Diagnosis not present

## 2023-02-12 DIAGNOSIS — I4819 Other persistent atrial fibrillation: Secondary | ICD-10-CM | POA: Diagnosis not present

## 2023-02-12 DIAGNOSIS — M5416 Radiculopathy, lumbar region: Secondary | ICD-10-CM | POA: Insufficient documentation

## 2023-02-12 HISTORY — PX: CARDIOVERSION: SHX1299

## 2023-02-12 LAB — POCT I-STAT, CHEM 8
BUN: 19 mg/dL (ref 8–23)
Calcium, Ion: 1.17 mmol/L (ref 1.15–1.40)
Chloride: 106 mmol/L (ref 98–111)
Creatinine, Ser: 1.1 mg/dL (ref 0.61–1.24)
Glucose, Bld: 104 mg/dL — ABNORMAL HIGH (ref 70–99)
HCT: 49 % (ref 39.0–52.0)
Hemoglobin: 16.7 g/dL (ref 13.0–17.0)
Potassium: 4.1 mmol/L (ref 3.5–5.1)
Sodium: 143 mmol/L (ref 135–145)
TCO2: 27 mmol/L (ref 22–32)

## 2023-02-12 SURGERY — CARDIOVERSION
Anesthesia: General

## 2023-02-12 MED ORDER — LIDOCAINE 2% (20 MG/ML) 5 ML SYRINGE
INTRAMUSCULAR | Status: DC | PRN
  Administered 2023-02-12: 40 mg via INTRAVENOUS

## 2023-02-12 MED ORDER — PROPOFOL 10 MG/ML IV BOLUS
INTRAVENOUS | Status: DC | PRN
  Administered 2023-02-12: 80 mg via INTRAVENOUS

## 2023-02-12 MED ORDER — SODIUM CHLORIDE 0.9 % IV SOLN: INTRAVENOUS | Status: DC

## 2023-02-12 NOTE — Transfer of Care (Signed)
Immediate Anesthesia Transfer of Care Note  Patient: Joe Hawkins  Procedure(s) Performed: CARDIOVERSION  Patient Location: Endoscopy Unit  Anesthesia Type:General  Level of Consciousness: drowsy  Airway & Oxygen Therapy: Patient Spontanous Breathing  Post-op Assessment: Report given to RN and Post -op Vital signs reviewed and stable  Post vital signs: Reviewed and stable  Last Vitals:  Vitals Value Taken Time  BP    Temp    Pulse    Resp    SpO2      Last Pain:  Vitals:   02/12/23 0900  TempSrc: Temporal  PainSc: 0-No pain         Complications: No notable events documented.

## 2023-02-12 NOTE — Interval H&P Note (Signed)
History and Physical Interval Note:  02/12/2023 9:06 AM  Joe Hawkins  has presented today for surgery, with the diagnosis of AFIB.  The various methods of treatment have been discussed with the patient and family. After consideration of risks, benefits and other options for treatment, the patient has consented to  Procedure(s): CARDIOVERSION (N/A) as a surgical intervention.  The patient's history has been reviewed, patient examined, no change in status, stable for surgery.  I have reviewed the patient's chart and labs.  Questions were answered to the patient's satisfaction.     [name]

## 2023-02-12 NOTE — Anesthesia Postprocedure Evaluation (Signed)
Anesthesia Post Note  Patient: Joe Hawkins  Procedure(s) Performed: CARDIOVERSION     Patient location during evaluation: PACU Anesthesia Type: General Level of consciousness: awake Pain management: pain level controlled Vital Signs Assessment: post-procedure vital signs reviewed and stable Respiratory status: spontaneous breathing, nonlabored ventilation and respiratory function stable Cardiovascular status: blood pressure returned to baseline and stable Postop Assessment: no apparent nausea or vomiting Anesthetic complications: no   No notable events documented.  Last Vitals:  Vitals:   02/12/23 0935 02/12/23 0945  BP: 108/75 101/71  Pulse: 78 76  Resp: 14 17  Temp:    SpO2: 93% 96%    Last Pain:  Vitals:   02/12/23 0945  TempSrc:   PainSc: 0-No pain                 Nilda Simmer

## 2023-02-12 NOTE — Anesthesia Preprocedure Evaluation (Addendum)
Anesthesia Evaluation  Patient identified by MRN, date of birth, ID band Patient awake    Reviewed: Allergy & Precautions, NPO status , Patient's Chart, lab work & pertinent test results  History of Anesthesia Complications Negative for: history of anesthetic complications  Airway Mallampati: III  TM Distance: >3 FB Neck ROM: Full   Comment: Previous airway grade I view with Glidescope 3, easy mask Dental  (+) Dental Advisory Given   Pulmonary neg shortness of breath, neg sleep apnea, neg COPD, neg recent URI, former smoker   Pulmonary exam normal breath sounds clear to auscultation       Cardiovascular (-) hypertension(-) angina (-) Past MI and (-) Cardiac Stents + dysrhythmias (on Eliquis) Atrial Fibrillation  Rhythm:Irregular Rate:Normal  TTE 07/26/2022: IMPRESSIONS     1. Left ventricular ejection fraction, by estimation, is 55 to 60%. The  left ventricle has normal function. The left ventricle has no regional  wall motion abnormalities. Left ventricular diastolic parameters are  indeterminate.   2. Right ventricular systolic function is mildly reduced. The right  ventricular size is mildly enlarged. There is normal pulmonary artery  systolic pressure. The estimated right ventricular systolic pressure is  99991111 mmHg.   3. Left atrial size was mildly dilated.   4. The mitral valve is normal in structure. No evidence of mitral valve  regurgitation.   5. The aortic valve is tricuspid. Aortic valve regurgitation is not  visualized. Aortic valve sclerosis/calcification is present, without any  evidence of aortic stenosis.   6. The inferior vena cava is normal in size with <50% respiratory  variability, suggesting right atrial pressure of 8 mmHg.     Neuro/Psych  Headaches  Neuromuscular disease (lumbar radiculopathy)    GI/Hepatic Neg liver ROS,GERD  Medicated,,  Endo/Other  negative endocrine ROS    Renal/GU negative  Renal ROS     Musculoskeletal   Abdominal  (+) + obese  Peds  Hematology negative hematology ROS (+)   Anesthesia Other Findings Afib ablation 12/31/2022  Reproductive/Obstetrics                             Anesthesia Physical Anesthesia Plan  ASA: 3  Anesthesia Plan: General   Post-op Pain Management:    Induction: Intravenous  PONV Risk Score and Plan: 2 and Treatment may vary due to age or medical condition  Airway Management Planned: Natural Airway and Mask  Additional Equipment:   Intra-op Plan:   Post-operative Plan:   Informed Consent: I have reviewed the patients History and Physical, chart, labs and discussed the procedure including the risks, benefits and alternatives for the proposed anesthesia with the patient or authorized representative who has indicated his/her understanding and acceptance.     Dental advisory given  Plan Discussed with: CRNA and Anesthesiologist  Anesthesia Plan Comments: (Risks of general anesthesia discussed including, but not limited to, sore throat, hoarse voice, chipped/damaged teeth, injury to vocal cords, nausea and vomiting, allergic reactions, lung infection, heart attack, stroke, and death. All questions answered. )        Anesthesia Quick Evaluation

## 2023-02-12 NOTE — Anesthesia Procedure Notes (Signed)
Procedure Name: General with mask airway Date/Time: 02/12/2023 9:14 AM  Performed by: Leonor Liv, CRNAPre-anesthesia Checklist: Patient identified, Emergency Drugs available, Suction available, Patient being monitored and Timeout performed Patient Re-evaluated:Patient Re-evaluated prior to induction Oxygen Delivery Method: Ambu bag Induction Type: IV induction Placement Confirmation: positive ETCO2 Dental Injury: Teeth and Oropharynx as per pre-operative assessment

## 2023-02-12 NOTE — CV Procedure (Signed)
Procedure: Electrical Cardioversion Indications:  Atrial Fibrillation  Procedure Details:  Consent: Risks of procedure as well as the alternatives and risks of each were explained to the (patient/caregiver).  Consent for procedure obtained.  Time Out: Verified patient identification, verified procedure, site/side was marked, verified correct patient position, special equipment/implants available, medications/allergies/relevent history reviewed, required imaging and test results available. PERFORMED.  Patient placed on cardiac monitor, pulse oximetry, supplemental oxygen as necessary.  Sedation given:  Propofol 80mg ; lidocaine 40mg  Pacer pads placed anterior and posterior chest.  Cardioverted 1 time(s).  Cardioversion with synchronized biphasic 200J shock.  Evaluation: Findings: Post procedure EKG shows: NSR with frequent PAC Complications: None Patient did tolerate procedure well.  Time Spent Directly with the Patient:  15minutes   [name] 02/12/2023, 9:19 AM

## 2023-02-12 NOTE — Discharge Instructions (Addendum)

## 2023-02-14 ENCOUNTER — Encounter (HOSPITAL_COMMUNITY): Payer: Self-pay | Admitting: Cardiology

## 2023-02-19 ENCOUNTER — Ambulatory Visit (HOSPITAL_COMMUNITY)
Admission: RE | Admit: 2023-02-19 | Discharge: 2023-02-19 | Disposition: A | Discharge status: Home / Self Care | Payer: 59 | Source: Ambulatory Visit | Attending: Internal Medicine | Admitting: Internal Medicine

## 2023-02-19 VITALS — BP 108/82 | HR 87 | Ht 68.0 in | Wt 242.6 lb

## 2023-02-19 DIAGNOSIS — I4819 Other persistent atrial fibrillation: Secondary | ICD-10-CM | POA: Diagnosis not present

## 2023-02-19 DIAGNOSIS — Z7901 Long term (current) use of anticoagulants: Secondary | ICD-10-CM | POA: Insufficient documentation

## 2023-02-19 NOTE — Progress Notes (Signed)
Primary Care Physician: Cassandria Anger, MD Referring Physician: Dr. Havery Moros  Primary EP: Dr Jodene Nam is a 62 y.o. male with a h/o Migraine h/a's and allergies, that presented for colonoscopy yesterday  for abdominal pain and was found to be in afib with rate controlled, asymptomatic. Procedure was cancelled and he was asked to come here for appointment this am. 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 He is here today with his wife and an interpretor. His wife speaks English very well. His ekg today continues to  show afib, rate controlled in the 80's. He is still unaware.   He drinks very rare alcohol, no tobacco. Minimal caffeine.  His wife indicates that he snores with witnessed apnea. She states he has not slept well in some time.   I started him on low dose metoprolol and eliquis 5 mg bid for a CHA2DS2VASc  score of 0, in case cardioversion was needed.   After he left, I found a phone note form Dr. Havery Moros that stated he had a colonic mass and would need colonoscopy as asap.   I called the wife and told her not to take the eliquis (he had taken one already) but  continue the low dose BB. He was scheduled to see me back in one week. He is now scheduled to get an echo at 8 am and I will see at 9 am after the echo. If all looks acceptable, he then can reschedule colonoscopy.   F/u one week 07/26/22. He was started on low dose BB with afib at 61 bpm but with a BP of 86/62 but for now will stop BB as I fear with colonic prep he may get syncopal. He had good rate control with afib on last visit with HR's in the 80's. Was hoping that possibly start of BB may help to restore SR. He had an echo earlier today and waiting the results. If unremarkable he go proceed to colonoscopy and restoring SR will be later addressed. He appears to be asymptomatic.   Follow up in the AF clinic 08/21/22. An interpreter was used for this visit. Patient remains in rate  controlled afib and is unaware of his arrhythmia. He resumed Eliquis but has missed 3 doses. No bleeding issues on anticoagulation.   F/u in afib clinic, 09/19/22. He had a successful cardioversion but found to have ERAF today. He is rate controlled and asymptomatic. He is here by himself today but speaks English very well.   Follow up in the AF clinic 01/28/23. Patient is s/p afib ablation with Dr Myles Gip on 12/31/22. Successful ablation without complication. He has been compliant with Eliquis, no missed doses, no bleeding concerns. He denies any chest pain, shortness of breath, or trouble swallowing. Leg incision sites healed without issue. Historically, he is unable to tell when he is in Afib. He is in rate controlled afib today.   On follow up today, 02/19/23. Patient is s/p DCCV on 02/12/23 with successful conversion to SR x 1 shock. Since then, he has been doing well overall. Unfortunately, he is back in Afib today. He is still on Eliquis 5 mg BID due to protocol post ablation. No missed doses of anticoagulation. No bleeding concerns. He cannot feel his Afib, but does tell me with help of interpreter that perhaps 3 days ago he felt a slight pressing sensation in his left chest. Overall, he is not completely sure. No chest pain or  shortness of breath or fatigue.   Today, he denies symptoms of palpitations, chest pain, shortness of breath, orthopnea, PND, lower extremity edema, dizziness, presyncope, syncope, or neurologic sequela.   Ablation: 12/31/22 Cardioversion: 02/12/23 Antiarrhythmic: None Anticoagulation: Eliquis 5 mg BID  Past Medical History:  Diagnosis Date   A-fib (HCC)    Allergic rhinitis    GERD (gastroesophageal reflux disease)    Low back pain    Sinusitis    Dr Constance Holster   Tinnitus 11/19/2009   Past Surgical History:  Procedure Laterality Date   ATRIAL FIBRILLATION ABLATION N/A 12/31/2022   Procedure: ATRIAL FIBRILLATION ABLATION;  Surgeon: Melida Quitter, MD;  Location: [facility];  Service: Cardiovascular;  Laterality: N/A;   CARDIOVERSION N/A 09/12/2022   Procedure: CARDIOVERSION;  Surgeon: [name], MD;  Location: [facility];  Service: Cardiovascular;  Laterality: N/A;   CARDIOVERSION N/A 02/12/2023   Procedure: CARDIOVERSION;  Surgeon: [name], MD;  Location: [facility];  Service: Cardiovascular;  Laterality: N/A;   CATARACT EXTRACTION     LUMBAR [place] SURGERY  11/19/2005   NASAL SINUS SURGERY  11/19/2006   TRANSFORAMINAL LUMBAR INTERBODY FUSION W/ MIS 1 LEVEL Left 12/26/2021   Procedure: Minimally Invasive Transforaminal Lumbar Interbody Fusion, Left Lumbar [id number];  Surgeon: Vallarie Mare, MD;  [facility];  Service: Neurosurgery;  Laterality: Left;  Minimally Invasive Transforaminal Lumbar Interbody Fusion, Left Lumbar [id number]    Current Outpatient Medications  Medication Sig Dispense Refill   acetaminophen (TYLENOL) 500 MG tablet Take 500-1,000 mg by mouth every 6 (six) hours as needed (pain.).     apixaban (ELIQUIS) 5 MG TABS tablet Take 1 tablet (5 mg total) by mouth 2 (two) times daily. 60 tablet 3   benzonatate (TESSALON) 200 MG capsule Take 1 capsule (200 mg total) by mouth 2 (two) times daily as needed for cough. (Patient taking differently: Take 200 mg by mouth daily as needed for cough.) 20 capsule 0   Cholecalciferol (VITAMIN D-3 PO) Take 1 tablet by mouth in the morning.     EPINEPHrine (EPIPEN) 0.3 mg/0.3 mL DEVI Inject 0.3 mLs (0.3 mg total) into the muscle once. 1 Device 0   fluticasone (FLONASE) 50 MCG/ACT nasal spray Place 2 sprays into both nostrils daily. (Patient taking differently: Place 2 sprays into both nostrils daily as needed for allergies.) 16 g 6   gabapentin (NEURONTIN) 100 MG capsule Take 1-2 capsules (100-200 mg total) by mouth at bedtime. (Patient not taking: Reported on 02/08/2023) 60 capsule 3   HYDROcodone bit-homatropine (HYCODAN) 5-1.5 MG/5ML syrup Take 5 mLs by mouth at  bedtime as needed for cough. (Patient not taking: Reported on 02/08/2023) 120 mL 0   levocetirizine (XYZAL) 5 MG tablet Take 1 tablet (5 mg total) by mouth daily as needed for allergies. 30 tablet 5   pantoprazole (PROTONIX) 40 MG tablet Take 40 mg by mouth daily.     Polyethyl Glycol-Propyl Glycol (SYSTANE) 0.4-0.3 % SOLN Place 1-2 drops into both eyes 3 (three) times daily as needed (dry/irritated eyes.).     triamcinolone cream (KENALOG) 0.5 % Apply 1 Application topically 3 (three) times daily. (Patient not taking: Reported on 02/08/2023) 45 g 1   triamcinolone ointment (KENALOG) 0.5 % Apply 1 Application topically 2 (two) times daily as needed (skin irritation.).     Trolamine Salicylate (BLUE-EMU MAXIMUM PAIN RELIEF EX) Apply 1 Application topically 3 (three) times daily as needed (pain.).     No current facility-administered medications for  this encounter.    Allergies  Allergen Reactions   Shellfish Allergy Itching    Crab, lobsters and shrimp.     Social History   Socioeconomic History   Marital status: Significant Other    Spouse name: Not on file   Number of children: 4   Years of education: Not on file   Highest education level: Not on file  Occupational History   Occupation: Archivist  Tobacco Use   Smoking status: Former    Types: Cigarettes    Quit date: 2014    Years since quitting: 10.2   Smokeless tobacco: Never   Tobacco comments:    Former smoker 08/21/22  Vaping Use   Vaping Use: Never used  Substance and Sexual Activity   Alcohol use: Yes    Comment: 1 per week 08/21/22   Drug use: No   Sexual activity: Not Currently  Other Topics Concern   Not on file  Social History Narrative   Married   Social Determinants of Health   Financial Resource Strain: Not on file  Food Insecurity: Not on file  Transportation Needs: Not on file  Physical Activity: Not on file  Stress: Not on file  Social Connections: Not on file  Intimate Partner Violence: Not on  file    Family History  Problem Relation Age of Onset   Cancer Neg Hx     ROS- All systems are reviewed and negative except as per the HPI above  Physical Exam: Vitals:   02/19/23 1004  Height: 5\' 8"  (1.727 m)    Wt Readings from Last 3 Encounters:  02/12/23 108.9 kg  01/28/23 110.9 kg  01/21/23 111.1 kg    Labs: Lab Results  Component Value Date   NA 143 02/12/2023   K 4.1 02/12/2023   CL 106 02/12/2023   CO2 24 02/11/2023   GLUCOSE 104 (H) 02/12/2023   BUN 19 02/12/2023   CREATININE 1.10 02/12/2023   CALCIUM 9.1 02/11/2023   No results found for: "INR" Lab Results  Component Value Date   CHOL 195 05/05/2021   HDL 47.60 05/05/2021   LDLCALC 117 (H) 05/05/2021   TRIG 154.0 (H) 05/05/2021    GEN- The patient is a well appearing male, alert and oriented x 3 today.   HEENT-head normocephalic, atraumatic, sclera clear, conjunctiva pink, hearing intact, trachea midline. Lungs- Clear to ausculation bilaterally, normal work of breathing Heart- Irregular rate and rhythm, no murmurs, rubs or gallops  GI- soft, NT, ND, + BS Extremities- no clubbing, cyanosis, or edema MS- no significant deformity or atrophy Skin- no rash or lesion Psych- euthymic mood, full affect Neuro- strength and sensation are intact   ECG today demonstrates AF HR 87 QRS 92 ms QT/Qtc 354/425 ms    Echo 07/26/22  1. Left ventricular ejection fraction, by estimation, is 55 to 60%. The left ventricle has normal function. The left ventricle has no regional wall motion abnormalities. Left ventricular diastolic parameters are indeterminate.   2. Right ventricular systolic function is mildly reduced. The right ventricular size is mildly enlarged. There is normal pulmonary artery systolic pressure. The estimated right ventricular systolic pressure is 99991111 mmHg.   3. Left atrial size was mildly dilated.   4. The mitral valve is normal in structure. No evidence of mitral valve regurgitation.   5. The  aortic valve is tricuspid. Aortic valve regurgitation is not  visualized. Aortic valve sclerosis/calcification is present, without any evidence of aortic stenosis.   6. The inferior  vena cava is normal in size with <50% respiratory variability, suggesting right atrial pressure of 8 mmHg.    CHA2DS2-VASc Score = 0  The patient's score is based upon: CHF History: 0 HTN History: 0 Diabetes History: 0 Stroke History: 0 Vascular Disease History: 0 Age Score: 0 Gender Score: 0       ASSESSMENT AND PLAN: 1. Persistent Atrial Fibrillation (ICD10:  I48.19) The patient's CHA2DS2-VASc score is 0, indicating a 0.2% annual risk of stroke.   S/p ablation on 12/31/22 S/p DCCV on 02/12/23  Unfortunately, he is back in Afib today.   Continue Eliquis 5 mg BID with no missed doses for 3 months post ablation.  Historically, he did not tolerate low dose BB due to significant bradycardia and hypotension   Patient admits that he does not know when he is in Afib and does not feel differently.  We can consider AAD therapy such as Multaq or Tikosyn however would have to weigh benefits vs adverse effects of beginning medication.  We will not make any medication changes today and continue with conservative observation at this time.    Follow up as scheduled with Dr. Myles Gip.   Emily Filbert, PA-C [facility] [address] [place], [place] [address] [phone]

## 2023-03-19 ENCOUNTER — Encounter: Payer: Self-pay | Admitting: Internal Medicine

## 2023-03-19 ENCOUNTER — Ambulatory Visit (INDEPENDENT_AMBULATORY_CARE_PROVIDER_SITE_OTHER): Payer: 59 | Admitting: Internal Medicine

## 2023-03-19 VITALS — BP 110/78 | HR 66 | Temp 98.0°F | Ht 68.0 in | Wt 242.0 lb

## 2023-03-19 DIAGNOSIS — J3089 Other allergic rhinitis: Secondary | ICD-10-CM | POA: Diagnosis not present

## 2023-03-19 DIAGNOSIS — I4819 Other persistent atrial fibrillation: Secondary | ICD-10-CM

## 2023-03-19 DIAGNOSIS — J320 Chronic maxillary sinusitis: Secondary | ICD-10-CM | POA: Diagnosis not present

## 2023-03-19 MED ORDER — FLUTICASONE PROPIONATE 50 MCG/ACT NA SUSP: 2.0000 | Freq: Every day | NASAL | 6 refills | Status: DC

## 2023-03-19 MED ORDER — METHYLPREDNISOLONE 4 MG PO TBPK: ORAL_TABLET | ORAL | 0 refills | Status: DC

## 2023-03-19 MED ORDER — AZITHROMYCIN 250 MG PO TABS
ORAL_TABLET | ORAL | 1 refills | Status: DC
Start: 2023-03-19 — End: 2023-06-17

## 2023-03-19 MED ORDER — LEVOCETIRIZINE DIHYDROCHLORIDE 5 MG PO TABS: 5.0000 mg | ORAL_TABLET | Freq: Every day | ORAL | 11 refills | Status: DC | PRN

## 2023-03-19 NOTE — Assessment & Plan Note (Signed)
Chronic Cont on Eliquis

## 2023-03-19 NOTE — Assessment & Plan Note (Addendum)
New episode Zpac Medrol pac

## 2023-03-19 NOTE — Progress Notes (Signed)
Subjective:  Patient ID: Joe Hawkins, male    DOB: 02-Jun-1961  Age: 62 y.o. MRN: [id number]  CC: Headache (Runny Nose, sinus pressure )   HPI Ming Wescott presents for a sinucs infection x 1 week - yellow d/c F/u allergies, A fib  Outpatient Medications Prior to Visit  Medication Sig Dispense Refill   acetaminophen (TYLENOL) 500 MG tablet Take 500-1,000 mg by mouth every 6 (six) hours as needed (pain.).     apixaban (ELIQUIS) 5 MG TABS tablet Take 1 tablet (5 mg total) by mouth 2 (two) times daily. 60 tablet 3   Cholecalciferol (VITAMIN D-3 PO) Take 1 tablet by mouth in the morning.     EPINEPHrine (EPIPEN) 0.3 mg/0.3 mL DEVI Inject 0.3 mLs (0.3 mg total) into the muscle once. 1 Device 0   meloxicam (MOBIC) 15 MG tablet Take 15 mg by mouth as needed.     pantoprazole (PROTONIX) 40 MG tablet Take 40 mg by mouth daily.     Polyethyl Glycol-Propyl Glycol (SYSTANE) 0.4-0.3 % SOLN Place 1-2 drops into both eyes 3 (three) times daily as needed (dry/irritated eyes.).     triamcinolone cream (KENALOG) 0.5 % Apply 1 Application topically 3 (three) times daily. 45 g 1   Trolamine Salicylate (BLUE-EMU MAXIMUM PAIN RELIEF EX) Apply 1 Application topically 3 (three) times daily as needed (pain.).     benzonatate (TESSALON) 200 MG capsule Take 1 capsule (200 mg total) by mouth 2 (two) times daily as needed for cough. (Patient taking differently: Take 200 mg by mouth daily as needed for cough.) 20 capsule 0   fluticasone (FLONASE) 50 MCG/ACT nasal spray Place 2 sprays into both nostrils daily. (Patient taking differently: Place 2 sprays into both nostrils daily as needed for allergies.) 16 g 6   HYDROcodone bit-homatropine (HYCODAN) 5-1.5 MG/5ML syrup Take 5 mLs by mouth at bedtime as needed for cough. 120 mL 0   levocetirizine (XYZAL) 5 MG tablet Take 1 tablet (5 mg total) by mouth daily as needed for allergies. 30 tablet 5   No facility-administered medications prior to visit.    ROS: Review of  Systems  Constitutional:  Negative for appetite change, fatigue and unexpected weight change.  HENT:  Positive for congestion, postnasal drip, rhinorrhea, sinus pressure and sinus pain. Negative for nosebleeds, sneezing, sore throat and trouble swallowing.   Eyes:  Negative for itching and visual disturbance.  Respiratory:  Negative for cough.   Cardiovascular:  Negative for chest pain, palpitations and leg swelling.  Gastrointestinal:  Negative for abdominal distention, blood in stool, diarrhea and nausea.  Genitourinary:  Negative for frequency and hematuria.  Musculoskeletal:  Negative for back pain, gait problem, joint swelling and neck pain.  Skin:  Negative for rash.  Neurological:  Positive for headaches. Negative for dizziness, tremors, speech difficulty and weakness.  Psychiatric/Behavioral:  Negative for agitation, dysphoric mood and sleep disturbance. The patient is not nervous/anxious.     Objective:  BP 110/78 (BP Location: Left Arm, Patient Position: Sitting, Cuff Size: Large)   Pulse 66   Temp 98 F (36.7 C) (Oral)   Ht 5\' 8"  (1.727 m)   Wt 242 lb (109.8 kg)   SpO2 92%   BMI 36.80 kg/m   BP Readings from Last 3 Encounters:  03/19/23 110/78  02/19/23 108/82  02/12/23 101/71    Wt Readings from Last 3 Encounters:  03/19/23 242 lb (109.8 kg)  02/19/23 242 lb 9.6 oz (110 kg)  02/12/23 240 lb (108.9 kg)  Physical Exam Constitutional:      General: He is not in acute distress.    Appearance: He is well-developed. He is obese.     Comments: NAD  HENT:     Mouth/Throat:     Pharynx: Oropharynx is clear.  Eyes:     Conjunctiva/sclera: Conjunctivae normal.     Pupils: Pupils are equal, round, and reactive to light.  Neck:     Thyroid: No thyromegaly.     Vascular: No JVD.  Cardiovascular:     Rate and Rhythm: Normal rate. Rhythm irregular.     Heart sounds: Normal heart sounds. No murmur heard.    No friction rub. No gallop.  Pulmonary:     Effort:  Pulmonary effort is normal. No respiratory distress.     Breath sounds: Normal breath sounds. No wheezing or rales.  Chest:     Chest wall: No tenderness.  Abdominal:     General: Bowel sounds are normal. There is no distension.     Palpations: Abdomen is soft. There is no mass.     Tenderness: There is no abdominal tenderness. There is no guarding or rebound.  Musculoskeletal:        General: No tenderness. Normal range of motion.     Cervical back: Normal range of motion.  Lymphadenopathy:     Cervical: No cervical adenopathy.  Skin:    General: Skin is warm and dry.     Findings: No rash.  Neurological:     Mental Status: He is alert and oriented to person, place, and time.     Cranial Nerves: No cranial nerve deficit.     Motor: No abnormal muscle tone.     Coordination: Coordination normal.     Gait: Gait normal.     Deep Tendon Reflexes: Reflexes are normal and symmetric.  Psychiatric:        Behavior: Behavior normal.        Thought Content: Thought content normal.        Judgment: Judgment normal.   Swollen nasal mucosa  Lab Results  Component Value Date   WBC 8.2 02/11/2023   HGB 16.7 02/12/2023   HCT 49.0 02/12/2023   PLT 218 02/11/2023   GLUCOSE 104 (H) 02/12/2023   CHOL 195 05/05/2021   TRIG 154.0 (H) 05/05/2021   HDL 47.60 05/05/2021   LDLCALC 117 (H) 05/05/2021   ALT 25 09/25/2022   AST 23 09/25/2022   NA 143 02/12/2023   K 4.1 02/12/2023   CL 106 02/12/2023   CREATININE 1.10 02/12/2023   BUN 19 02/12/2023   CO2 24 02/11/2023   TSH 1.30 05/05/2021   PSA 0.54 01/11/2022   HGBA1C 5.5 01/01/2017    No results found.  Assessment & Plan:   Problem List Items Addressed This Visit     Sinusitis, chronic - Primary    New episode Zpac Medrol pac      Relevant Medications   fluticasone (FLONASE) 50 MCG/ACT nasal spray   levocetirizine (XYZAL) 5 MG tablet   azithromycin (ZITHROMAX Z-PAK) 250 MG tablet   methylPREDNISolone (MEDROL DOSEPAK) 4 MG  TBPK tablet   Allergic rhinitis    Worse Cont on Xyzal, Flonase Rx - Medrol pac      Atrial fibrillation (HCC)    Chronic Cont on Eliquis         Meds ordered this encounter  Medications   fluticasone (FLONASE) 50 MCG/ACT nasal spray    Sig: Place 2 sprays into both  nostrils daily.    Dispense:  16 g    Refill:  6   levocetirizine (XYZAL) 5 MG tablet    Sig: Take 1 tablet (5 mg total) by mouth daily as needed for allergies.    Dispense:  30 tablet    Refill:  11   azithromycin (ZITHROMAX Z-PAK) 250 MG tablet    Sig: As directed    Dispense:  6 tablet    Refill:  1   methylPREDNISolone (MEDROL DOSEPAK) 4 MG TBPK tablet    Sig: As directed    Dispense:  21 tablet    Refill:  0      Follow-up: Return in about 4 months (around 07/19/2023) for a follow-up visit.  Sonda Primes, MD

## 2023-03-19 NOTE — Assessment & Plan Note (Signed)
Worse Cont on Xyzal, Flonase Rx - Medrol pac

## 2023-03-21 DIAGNOSIS — M5416 Radiculopathy, lumbar region: Secondary | ICD-10-CM | POA: Diagnosis not present

## 2023-03-21 DIAGNOSIS — Z6835 Body mass index (BMI) 35.0-35.9, adult: Secondary | ICD-10-CM | POA: Diagnosis not present

## 2023-04-02 ENCOUNTER — Encounter: Payer: Self-pay | Admitting: Cardiovascular Disease

## 2023-04-02 ENCOUNTER — Ambulatory Visit: Payer: 59 | Attending: Cardiovascular Disease | Admitting: Cardiovascular Disease

## 2023-04-02 ENCOUNTER — Telehealth: Payer: Self-pay | Admitting: *Deleted

## 2023-04-02 VITALS — BP 118/76 | HR 108 | Ht 68.0 in | Wt 246.8 lb

## 2023-04-02 DIAGNOSIS — I4891 Unspecified atrial fibrillation: Secondary | ICD-10-CM

## 2023-04-02 MED ORDER — APIXABAN 5 MG PO TABS
5.0000 mg | ORAL_TABLET | Freq: Two times a day (BID) | ORAL | 3 refills | Status: DC
Start: 2023-04-02 — End: 2023-06-04

## 2023-04-02 NOTE — Patient Instructions (Addendum)
Medication Instructions:  Your physician recommends that you continue on your current medications as directed. Please refer to the Current Medication list given to you today.  *If you need a refill on your cardiac medications before your next appointment, please call your pharmacy*  Testing/Procedures: Sleep Study  Your physician has recommended that you have a sleep study. This test records several body functions during sleep, including: brain activity, eye movement, oxygen and carbon dioxide blood levels, heart rate and rhythm, breathing rate and rhythm, the flow of air through your mouth and nose, snoring, body muscle movements, and chest and belly movement.  Atrial Fibrillation Ablation  Your physician has recommended that you have an ablation. Catheter ablation is a medical procedure used to treat some cardiac arrhythmias (irregular heartbeats). During catheter ablation, a long, thin, flexible tube is put into a blood vessel in your groin (upper thigh), or neck. This tube is called an ablation catheter. It is then guided to your heart through the blood vessel. Radio frequency waves destroy small areas of heart tissue where abnormal heartbeats may cause an arrhythmia to start. Please see the instruction sheet given to you today.  You are scheduled for Atrial Fibrillation Ablation on Tuesday, November 19 with Dr. Halford Chessman.Please arrive at the Main Entrance A at [facility]: [address] [place], [place] [address] at 8:30 AM    Follow-Up: At [facility], you and your health needs are our priority.  As part of our continuing mission to provide you with exceptional heart care, we have created designated Provider Care Teams.  These Care Teams include your primary Cardiologist (physician) and Advanced Practice Providers (APPs -  Physician Assistants and Nurse Practitioners) who all work together to provide you with the care you need, when you need it.  We recommend signing up for  the patient portal called "MyChart".  Sign up information is provided on this After Visit Summary.  MyChart is used to connect with patients for Virtual Visits (Telemedicine).  Patients are able to view lab/test results, encounter notes, upcoming appointments, etc.  Non-urgent messages can be sent to your provider as well.   To learn more about what you can do with MyChart, go to [email or web address].    Your next appointment:   2 month(s) after sleep study has been completed  Provider:   York Pellant, MD

## 2023-04-02 NOTE — Telephone Encounter (Signed)
Dr. Nelly Laurence ordered an Itamar study for the pt. Pt agreeable to signed waiver and not open the box until he has been called with PIN#.

## 2023-04-02 NOTE — Progress Notes (Signed)
Electrophysiology Office Note:    Date:  04/02/2023   ID:  Joe Hawkins, DOB June 12, 1961, MRN [id number]  PCP:  Tresa Garter, MD   [facility] Providers Cardiologist:  None Electrophysiologist:  Maurice Small, MD     Referring MD: Tresa Garter, MD   History of Present Illness:    Joe Hawkins is a 62 y.o. male with a hx listed below, significant for atrial fibrillation, referred for arrhythmia management.  He was recently diagnosed with atrial fibrillation detected incidentally at the time of a colonoscopy in August 2023. I reviewed several office visit notes from July and prior months, all of which documented normal rate and rhythm. He was given metoprolol and eliquis. In follow-up, he was hypotensive, and HR was 61 bpm, so betablocker was held. He underwent DC cardioversion with ERAF.   He tells me today that he has noticed his heart "feels like there is something crawling on it..  feel like there is a worm moving on it."   Today, he notes that he has been having more fatigue. He often feels tired.     Past Medical History:  Diagnosis Date   A-fib (HCC)    Allergic rhinitis    GERD (gastroesophageal reflux disease)    Low back pain    Sinusitis    Dr Pollyann Kennedy   Tinnitus 11/19/2009    Past Surgical History:  Procedure Laterality Date   ATRIAL FIBRILLATION ABLATION N/A 12/31/2022   Procedure: ATRIAL FIBRILLATION ABLATION;  Surgeon: Maurice Small, MD;  Location: [facility] INVASIVE CV LAB;  Service: Cardiovascular;  Laterality: N/A;   CARDIOVERSION N/A 09/12/2022   Procedure: CARDIOVERSION;  Surgeon: Meriam Sprague, MD;  Location: [facility] ENDOSCOPY;  Service: Cardiovascular;  Laterality: N/A;   CARDIOVERSION N/A 02/12/2023   Procedure: CARDIOVERSION;  Surgeon: Meriam Sprague, MD;  Location: [facility] ENDOSCOPY;  Service: Cardiovascular;  Laterality: N/A;   CATARACT EXTRACTION     LUMBAR DISC SURGERY  11/19/2005   NASAL SINUS SURGERY  11/19/2006    TRANSFORAMINAL LUMBAR INTERBODY FUSION W/ MIS 1 LEVEL Left 12/26/2021   Procedure: Minimally Invasive Transforaminal Lumbar Interbody Fusion, Left Lumbar [id number];  Surgeon: Bedelia Person, MD;  Location: [facility] OR;  Service: Neurosurgery;  Laterality: Left;  Minimally Invasive Transforaminal Lumbar Interbody Fusion, Left Lumbar [id number]    Current Medications: Current Meds  Medication Sig   acetaminophen (TYLENOL) 500 MG tablet Take 500-1,000 mg by mouth every 6 (six) hours as needed (pain.).   apixaban (ELIQUIS) 5 MG TABS tablet Take 1 tablet (5 mg total) by mouth 2 (two) times daily.   azithromycin (ZITHROMAX Z-PAK) 250 MG tablet As directed   Cholecalciferol (VITAMIN D-3 PO) Take 1 tablet by mouth in the morning.   EPINEPHrine (EPIPEN) 0.3 mg/0.3 mL DEVI Inject 0.3 mLs (0.3 mg total) into the muscle once.   fluticasone (FLONASE) 50 MCG/ACT nasal spray Place 2 sprays into both nostrils daily.   levocetirizine (XYZAL) 5 MG tablet Take 1 tablet (5 mg total) by mouth daily as needed for allergies.   meloxicam (MOBIC) 15 MG tablet Take 15 mg by mouth as needed.   methylPREDNISolone (MEDROL DOSEPAK) 4 MG TBPK tablet As directed   pantoprazole (PROTONIX) 40 MG tablet Take 40 mg by mouth daily.   Polyethyl Glycol-Propyl Glycol (SYSTANE) 0.4-0.3 % SOLN Place 1-2 drops into both eyes 3 (three) times daily as needed (dry/irritated eyes.).   triamcinolone cream (KENALOG) 0.5 % Apply 1 Application topically 3 (three) times daily.  Trolamine Salicylate (BLUE-EMU MAXIMUM PAIN RELIEF EX) Apply 1 Application topically 3 (three) times daily as needed (pain.).     Allergies:   Shellfish allergy   Social History   Socioeconomic History   Marital status: Significant Other    Spouse name: Not on file   Number of children: 4   Years of education: Not on file   Highest education level: Not on file  Occupational History   Occupation: Academic librarian  Tobacco Use   Smoking status: Former    Types:  Cigarettes    Quit date: 2014    Years since quitting: 10.3   Smokeless tobacco: Never   Tobacco comments:    Former smoker 08/21/22  Vaping Use   Vaping Use: Never used  Substance and Sexual Activity   Alcohol use: Yes    Comment: 1 per week 08/21/22   Drug use: No   Sexual activity: Not Currently  Other Topics Concern   Not on file  Social History Narrative   Married   Social Determinants of Health   Financial Resource Strain: Not on file  Food Insecurity: Not on file  Transportation Needs: Not on file  Physical Activity: Not on file  Stress: Not on file  Social Connections: Not on file     Family History: The patient's family history is negative for Cancer.  ROS:   Please see the history of present illness.    All other systems reviewed and are negative.  EKGs/Labs/Other Studies Reviewed Today:    TTE 07/26/2022 EF 55-60%, mildly dilated LA  EKG:  Last EKG results: today - AF with right superior axis, Q waves anterior and inferior leads   Recent Labs: 09/25/2022: ALT 25 02/11/2023: Platelets 218 02/12/2023: BUN 19; Creatinine, Ser 1.10; Hemoglobin 16.7; Potassium 4.1; Sodium 143     Physical Exam:    VS:  BP 118/76   Pulse (!) 108   Ht 5\' 8"  (1.727 m)   Wt 246 lb 12.8 oz (111.9 kg)   SpO2 95%   BMI 37.53 kg/m     Wt Readings from Last 3 Encounters:  04/02/23 246 lb 12.8 oz (111.9 kg)  03/19/23 242 lb (109.8 kg)  02/19/23 242 lb 9.6 oz (110 kg)     GEN:  Well nourished, well developed in no acute distress CARDIAC: Irregular rhythm, no murmurs, rubs, gallops RESPIRATORY:  Normal work of breathing MUSCULOSKELETAL: no edema    ASSESSMENT & PLAN:    Atrial fibrillation: persistent, diagnosed incidentally. Symptoms include palpitations. S/p ablation 12/31/2022 with early recurrence. He had a cardioversion 3/26 but returned to clinic in atrial fibrillation. Today, he notes that he is often tired. He frequently nods off during the day time.       -  refer for sleep study       - his atrium is small and AF coarse on ACG -- I am surprised he has had such recurrence of AF. I am going to get him on the books for an ablation. If he is diagnosed with OSA and starts CPAP, I would repeat cardioversion. Secondary hypercoagulable state: CHADS2Vasc is 0 -- but he may have a cardioversion upcoming, so we'll continue eliquis for now.         Medication Adjustments/Labs and Tests Ordered: Current medicines are reviewed at length with the patient today.  Concerns regarding medicines are outlined above.  No orders of the defined types were placed in this encounter.  No orders of the defined types were placed in  this encounter.    Signed, Maurice Small, MD  04/02/2023 11:37 AM    [facility]

## 2023-04-25 ENCOUNTER — Other Ambulatory Visit: Payer: Self-pay

## 2023-04-25 DIAGNOSIS — I4891 Unspecified atrial fibrillation: Secondary | ICD-10-CM

## 2023-04-25 NOTE — Telephone Encounter (Signed)
I s/w the pt and his son. Pt asked to refresh him on how to do the sleep study. I went over the instructions with the pt and his 62 yr old son. I walked the son through how the sleep study works and how to look at the directions. Pt's son did a great job and verbalized understanding to all instructions that I was giving him. I had pt's son Trinna Post locate the paper that has the 800 for the sleep company incase they had any problems. I advised Alex to help his dad look at the instructions on the phone using the link View Quick Guide that way they could go slide by slide. Alex understands they can call the 800# incase they have a problem on the night the pt wants to do the sleep study.   Both pt and son have been given PIN# [id number].   Called and made the patient aware that he may proceed with the [facility] Sleep Study. PIN # provided to the patient. Patient made aware that he will be contacted after the test has been read with the results and any recommendations. Patient verbalized understanding and thanked me for the call.    Pt and his son thanked me for my help.

## 2023-04-25 NOTE — Telephone Encounter (Signed)
Prior Authorization for [facility] sent to [employer] via web portal. Tracking Number .  READY- NO PR REQ

## 2023-04-25 NOTE — Telephone Encounter (Signed)
Message was sent to sleep coordinator Coralee North to check if Joe Hawkins study has been approved. Sleep study was ordered on 04/02/23.

## 2023-04-26 ENCOUNTER — Encounter (INDEPENDENT_AMBULATORY_CARE_PROVIDER_SITE_OTHER): Payer: 59 | Admitting: Cardiology

## 2023-04-26 DIAGNOSIS — G4733 Obstructive sleep apnea (adult) (pediatric): Secondary | ICD-10-CM | POA: Diagnosis not present

## 2023-04-30 ENCOUNTER — Ambulatory Visit: Payer: 59 | Attending: Cardiovascular Disease

## 2023-04-30 DIAGNOSIS — I4891 Unspecified atrial fibrillation: Secondary | ICD-10-CM

## 2023-04-30 NOTE — Procedures (Signed)
SLEEP STUDY REPORT Patient Information Study Date: 04/26/2023 Patient Name: Joe Hawkins Patient ID: [id number] Birth Date: February 25, 1961 Age: 62 Gender: Male BMI: 36.4 (W=240 lb, H=5' 8'') Stopbang: 6 Referring Physician: York Pellant, MD  TEST DESCRIPTION: Home sleep apnea testing was completed using the WatchPat, a Type 1 device, utilizing peripheral arterial tonometry (PAT), chest movement, actigraphy, pulse oximetry, pulse rate, body position and snore.  AHI was calculated with apnea and hypopnea using valid sleep time as the denominator. RDI includes apneas, hypopneas, and RERAs.  The data acquired and the scoring of sleep and all associated events were performed in accordance with the recommended standards and specifications as outlined in the AASM Manual for the Scoring of Sleep and Associated Events 2.2.0 (2015).  FINDINGS:  1.  Severe Obstructive Sleep Apnea with AHI 42.4/hr.   2.  No Central Sleep Apnea with pAHIc 1.9/hr.  3.  Oxygen desaturations as low as 81%.  4.  Severe snoring was present. O2 sats were < 88% for 33.6 min.  5.  Total sleep time was 7 hrs and 54 min.  6.  21.3% of total sleep time was spent in REM sleep.   7.  Shortened sleep onset latency at 5 min.   8.  Shortened REM sleep onset latency at 62 min.   9.  Total awakenings were 11.  10. Arrhythmia detection:  Suggestive of possible brief atrial fibrillation lasting 7 hours, 33 min and 23 seconds.  This is not diagnostic and further testing with outpatient telemetry monitoring is recommended.  DIAGNOSIS:   Severe Obstructive Sleep Apnea (G47.33) Nocturnal Hypoxemia Possible Atrial Fibrillation  RECOMMENDATIONS:   1.  Clinical correlation of these findings is necessary.  The decision to treat obstructive sleep apnea (OSA) is usually based on the presence of apnea symptoms or the presence of associated medical conditions such as Hypertension, Congestive Heart Failure, Atrial Fibrillation or Obesity.  The  most common symptoms of OSA are snoring, gasping for breath while sleeping, daytime sleepiness and fatigue.   2.  Initiating apnea therapy is recommended given the presence of symptoms and/or associated conditions. Recommend proceeding with one of the following:     a.  Auto-CPAP therapy with a pressure range of 5-20cm H2O.     b.  An oral appliance (OA) that can be obtained from certain dentists with expertise in sleep medicine.  These are primarily of use in non-obese patients with mild and moderate disease.     c.  An ENT consultation which may be useful to look for specific causes of obstruction and possible treatment options.     d.  If patient is intolerant to PAP therapy, consider referral to ENT for evaluation for hypoglossal nerve stimulator.   3.  Close follow-up is necessary to ensure success with CPAP or oral appliance therapy for maximum benefit.  4.  A follow-up oximetry study on CPAP is recommended to assess the adequacy of therapy and determine the need for supplemental oxygen or the potential need for Bi-level therapy.  An arterial blood gas to determine the adequacy of baseline ventilation and oxygenation should also be considered.  5.  Healthy sleep recommendations include:  adequate nightly sleep (normal 7-9 hrs/night), avoidance of caffeine after noon and alcohol near bedtime, and maintaining a sleep environment that is cool, dark and quiet.  6.  Weight loss for overweight patients is recommended.  Even modest amounts of weight loss can significantly improve the severity of sleep apnea.  7.  Snoring recommendations  include:  weight loss where appropriate, side sleeping, and avoidance of alcohol before bed.  8.  Operation of motor vehicle should be avoided when sleepy.  Signature: Armanda Magic, MD; [facility]; Diplomat, American Board of Sleep Medicine Electronically Signed: 04/30/2023 11:25:52 AM

## 2023-05-01 ENCOUNTER — Other Ambulatory Visit: Payer: Self-pay

## 2023-05-01 ENCOUNTER — Telehealth: Payer: Self-pay

## 2023-05-01 DIAGNOSIS — G4733 Obstructive sleep apnea (adult) (pediatric): Secondary | ICD-10-CM

## 2023-05-01 NOTE — Telephone Encounter (Signed)
Notified patient and his wife, per DPR, of sleep study results and recommendations. Patient verbalize understanding. All questions were answered. CPAP Titration ordered 05/01/23.

## 2023-05-01 NOTE — Telephone Encounter (Signed)
-----   Message from Quintella Reichert, MD sent at 04/30/2023 11:27 AM EDT ----- Please let patient know that they have sleep apnea.  Recommend therapeutic CPAP titration for treatment of patient's sleep disordered breathing.  If unable to perform an in lab titration then initiate ResMed auto CPAP from 4 to 15cm H2O with heated humidity and mask of choice and overnight pulse ox on CPAP.

## 2023-05-16 NOTE — Addendum Note (Signed)
Addended by: Brunetta Genera on: 05/16/2023 02:37 PM   Modules accepted: Orders

## 2023-06-04 ENCOUNTER — Encounter: Payer: Self-pay | Admitting: Cardiovascular Disease

## 2023-06-04 ENCOUNTER — Ambulatory Visit: Payer: 59 | Attending: Cardiovascular Disease | Admitting: Cardiovascular Disease

## 2023-06-04 VITALS — BP 124/78 | HR 84 | Ht 68.0 in | Wt 253.0 lb

## 2023-06-04 DIAGNOSIS — I4819 Other persistent atrial fibrillation: Secondary | ICD-10-CM

## 2023-06-04 DIAGNOSIS — I4891 Unspecified atrial fibrillation: Secondary | ICD-10-CM | POA: Diagnosis not present

## 2023-06-04 MED ORDER — APIXABAN 5 MG PO TABS
5.0000 mg | ORAL_TABLET | Freq: Two times a day (BID) | ORAL | 3 refills | Status: DC
Start: 2023-06-04 — End: 2023-12-02

## 2023-06-04 NOTE — Progress Notes (Signed)
  Electrophysiology Office Note:    Date:  06/04/2023   ID:  Joe Hawkins, DOB 1960/12/22, MRN [id number]  PCP:  Tresa Garter, MD   [facility] Providers Cardiologist:  None Electrophysiologist:  Maurice Small, MD     Referring MD: Tresa Garter, MD   History of Present Illness:    Joe Hawkins is a 62 y.o. male with a hx significant for atrial fibrillation, referred for arrhythmia management.     He was recently diagnosed with atrial fibrillation detected incidentally at the time of a colonoscopy in August 2023. I reviewed several office visit notes from July and prior months, all of which documented normal rate and rhythm. He was given metoprolol and eliquis. In follow-up, he was hypotensive, and HR was 61 bpm, so betablocker was held. He underwent DC cardioversion with ERAF.   In the past he has noticed his heart "feels like there is something crawling on it..feel like there is a worm moving on it."   He was referred for a sleep evaluation after complaining of daytime somnolence. He was diagnosed with severe OSA.        EKGs/Labs/Other Studies Reviewed Today:    TTE 07/26/2022 EF 55-60%, mildly dilated LA  EKG:  Last EKG results: today - AF with right superior axis, Q waves anterior and inferior leads   Recent Labs: 09/25/2022: ALT 25 02/11/2023: Platelets 218 02/12/2023: BUN 19; Creatinine, Ser 1.10; Hemoglobin 16.7; Potassium 4.1; Sodium 143     Physical Exam:    VS:  BP 124/78   Pulse 84   Ht 5\' 8"  (1.727 m)   Wt 253 lb (114.8 kg)   SpO2 98%   BMI 38.47 kg/m     Wt Readings from Last 3 Encounters:  06/04/23 253 lb (114.8 kg)  04/02/23 246 lb 12.8 oz (111.9 kg)  03/19/23 242 lb (109.8 kg)     GEN:  Well nourished, well developed in no acute distress CARDIAC: Irregular rhythm, no murmurs, rubs, gallops RESPIRATORY:  Normal work of breathing MUSCULOSKELETAL: no edema    ASSESSMENT & PLAN:    Atrial fibrillation:  persistent,  diagnosed incidentally.  Symptoms include palpitations.  S/p ablation 12/31/2022 with early recurrence. He had a cardioversion 3/26 but returned to clinic in atrial fibrillation.  his atrium is small and AF coarse on ECG -- I am surprised he has had such a recurrence of AF. I suspect sleep apnea has been contributing. He is tentatively scheduled for AF ablation 10/08/2023 Will plan for DCCV again after he is routinely using CPAP  Secondary hypercoagulable state:  CHADS2Vasc is 0 -- but he may have a cardioversion upcoming, so we'll continue eliquis for now.   Obstructive sleep apnea Recently diagnosed Needs to start on CPAP        Medication Adjustments/Labs and Tests Ordered: Current medicines are reviewed at length with the patient today.  Concerns regarding medicines are outlined above.  Orders Placed This Encounter  Procedures   EKG 12-Lead   No orders of the defined types were placed in this encounter.    Signed, Maurice Small, MD  06/04/2023 1:11 PM    [facility]

## 2023-06-04 NOTE — Patient Instructions (Signed)
Medication Instructions:  Your physician recommends that you continue on your current medications as directed. Please refer to the Current Medication list given to you today. *If you need a refill on your cardiac medications before your next appointment, please call your pharmacy*   Testing/Procedures: Cardioversion (AFTER you have been using the CPAP machine for 1 month) Your physician has recommended that you have a Cardioversion (DCCV). Electrical Cardioversion uses a jolt of electricity to your heart either through paddles or wired patches attached to your chest. This is a controlled, usually prescheduled, procedure. Defibrillation is done under light anesthesia in the hospital, and you usually go home the day of the procedure. This is done to get your heart back into a normal rhythm. You are not awake for the procedure. Please see the instruction sheet given to you today.   Follow-Up: At [facility], you and your health needs are our priority.  As part of our continuing mission to provide you with exceptional heart care, we have created designated Provider Care Teams.  These Care Teams include your primary Cardiologist (physician) and Advanced Practice Providers (APPs -  Physician Assistants and Nurse Practitioners) who all work together to provide you with the care you need, when you need it.  We recommend signing up for the patient portal called "MyChart".  Sign up information is provided on this After Visit Summary.  MyChart is used to connect with patients for Virtual Visits (Telemedicine).  Patients are able to view lab/test results, encounter notes, upcoming appointments, etc.  Non-urgent messages can be sent to your provider as well.   To learn more about what you can do with MyChart, go to [email or web address].    Your next appointment:   We will contact you to set up cardioversion and follow up   Provider:   York Pellant, MD

## 2023-06-14 ENCOUNTER — Telehealth: Payer: Self-pay

## 2023-06-14 NOTE — Telephone Encounter (Signed)
Spoke with patient - still has not received his CPAP machine. Patient aware we are waiting to schedule his cardioversion until after he has been using this CPAP for a month. Will follow up with sleep pool.

## 2023-06-17 ENCOUNTER — Ambulatory Visit (INDEPENDENT_AMBULATORY_CARE_PROVIDER_SITE_OTHER): Payer: 59 | Admitting: Family Medicine

## 2023-06-17 ENCOUNTER — Encounter: Payer: Self-pay | Admitting: Family Medicine

## 2023-06-17 VITALS — BP 116/66 | HR 72 | Temp 97.8°F | Resp 20 | Ht 68.0 in | Wt 254.0 lb

## 2023-06-17 DIAGNOSIS — R1030 Lower abdominal pain, unspecified: Secondary | ICD-10-CM | POA: Diagnosis not present

## 2023-06-17 DIAGNOSIS — R319 Hematuria, unspecified: Secondary | ICD-10-CM

## 2023-06-17 LAB — POCT URINALYSIS DIPSTICK
Bilirubin, UA: NEGATIVE
Blood, UA: POSITIVE
Glucose, UA: NEGATIVE
Ketones, UA: NEGATIVE
Nitrite, UA: NEGATIVE
Protein, UA: NEGATIVE
Spec Grav, UA: 1.01 (ref 1.010–1.025)
Urobilinogen, UA: NEGATIVE E.U./dL — AB
pH, UA: 5 (ref 5.0–8.0)

## 2023-06-17 NOTE — Progress Notes (Signed)
Translator Sokkumyumg used for the duration of this visit.  Assessment & Plan:  1. Hematuria, unspecified type Will refer to urology if urine culture is negative.  - Urine Culture  2. Lower abdominal pain Chronic lower abdominal pain.Prostate vs. Bladder? - POCT urinalysis dipstick - PSA - Urine Culture Results for orders placed or performed in visit on 06/17/23  Urine Culture   Specimen: Urine  Result Value Ref Range   Source: URINE    Status: FINAL    Result: No Growth   POCT urinalysis dipstick  Result Value Ref Range   Color, UA dark yellow    Clarity, UA clear    Glucose, UA Negative Negative   Bilirubin, UA neg    Ketones, UA neg    Spec Grav, UA 1.010 1.010 - 1.025   Blood, UA pos    pH, UA 5.0 5.0 - 8.0   Protein, UA Negative Negative   Urobilinogen, UA negative (A) 0.2 or 1.0 E.U./dL   Nitrite, UA neg    Leukocytes, UA Trace (A) Negative   Appearance     Odor       Follow up plan: Return if symptoms worsen or fail to improve.  Deliah Boston, MSN, APRN, FNP-C  Subjective:  HPI: Joe Hawkins is a 62 y.o. male presenting on 06/17/2023 for Abdominal Pain (Lower pain x 5-6 months - pain has stayed about the same (pt thinks it could be hernia or prostate) )  Patient reports with the lower abdominal pain that per chart review started over a year ago.  A CT of abdomen and pelvis on 04/19/2022 showed circumferential wall thickening of the distal ileum/proximal colon at the ileocecal valve with a few prominent right mesenteric/colonic lymph nodes.  He was seen by gastroenterology where he had a colonoscopy that showed two polyps, diverticulosis, and internal hemorrhoids.  He was advised to have a repeat colonoscopy in 5 years.  He reports normal bowel movements and denies any urinary symptoms.  He does report occasional fatigue.  No fever.  Pain is present all the time but is worse when he tries to roll over in the bed or stands up from a sitting position.  He rates his  pain 7-8/10 on average.  He has not taken anything for the pain as he was told by cardiology not to take anything since he is on a blood thinner.    ROS: Negative unless specifically indicated above in HPI.   Relevant past medical history reviewed and updated as indicated.   Allergies and medications reviewed and updated.   Current Outpatient Medications:    acetaminophen (TYLENOL) 500 MG tablet, Take 500-1,000 mg by mouth every 6 (six) hours as needed (pain.)., Disp: , Rfl:    apixaban (ELIQUIS) 5 MG TABS tablet, Take 1 tablet (5 mg total) by mouth 2 (two) times daily., Disp: 60 tablet, Rfl: 3   Cholecalciferol (VITAMIN D-3 PO), Take 1 tablet by mouth in the morning., Disp: , Rfl:    fluticasone (FLONASE) 50 MCG/ACT nasal spray, Place 2 sprays into both nostrils daily., Disp: 16 g, Rfl: 6   levocetirizine (XYZAL) 5 MG tablet, Take 1 tablet (5 mg total) by mouth daily as needed for allergies., Disp: 30 tablet, Rfl: 11   pantoprazole (PROTONIX) 40 MG tablet, Take 40 mg by mouth daily., Disp: , Rfl:    Polyethyl Glycol-Propyl Glycol (SYSTANE) 0.4-0.3 % SOLN, Place 1-2 drops into both eyes 3 (three) times daily as needed (dry/irritated eyes.)., Disp: , Rfl:  triamcinolone cream (KENALOG) 0.5 %, Apply 1 Application topically 3 (three) times daily., Disp: 45 g, Rfl: 1   Trolamine Salicylate (BLUE-EMU MAXIMUM PAIN RELIEF EX), Apply 1 Application topically 3 (three) times daily as needed (pain.)., Disp: , Rfl:    EPINEPHrine (EPIPEN) 0.3 mg/0.3 mL DEVI, Inject 0.3 mLs (0.3 mg total) into the muscle once. (Patient not taking: Reported on 06/17/2023), Disp: 1 Device, Rfl: 0   meloxicam (MOBIC) 15 MG tablet, Take 15 mg by mouth as needed. (Patient not taking: Reported on 06/17/2023), Disp: , Rfl:   Allergies  Allergen Reactions   Shellfish Allergy Itching    Crab, lobsters and shrimp.     Objective:   BP 116/66   Pulse 72   Temp 97.8 F (36.6 C)   Resp 20   Ht 5\' 8"  (1.727 m)   Wt 254 lb  (115.2 kg)   BMI 38.62 kg/m    Physical Exam Vitals reviewed.  Constitutional:      General: He is not in acute distress.    Appearance: Normal appearance. He is not ill-appearing, toxic-appearing or diaphoretic.  HENT:     Head: Normocephalic and atraumatic.  Eyes:     General: No scleral icterus.       Right eye: No discharge.        Left eye: No discharge.     Conjunctiva/sclera: Conjunctivae normal.  Cardiovascular:     Rate and Rhythm: Normal rate.  Pulmonary:     Effort: Pulmonary effort is normal. No respiratory distress.  Abdominal:     General: Bowel sounds are normal.     Palpations: Abdomen is soft.     Tenderness: There is abdominal tenderness in the suprapubic area.  Musculoskeletal:        General: Normal range of motion.     Cervical back: Normal range of motion.  Skin:    General: Skin is warm and dry.  Neurological:     Mental Status: He is alert and oriented to person, place, and time. Mental status is at baseline.  Psychiatric:        Mood and Affect: Mood normal.        Behavior: Behavior normal.        Thought Content: Thought content normal.        Judgment: Judgment normal.

## 2023-06-17 NOTE — Patient Instructions (Signed)
Tylenol for pain.  Waiting on results from your urine culture. If it is normal, I will refer you to a urologist.

## 2023-06-19 ENCOUNTER — Telehealth: Payer: Self-pay | Admitting: *Deleted

## 2023-06-19 DIAGNOSIS — G4733 Obstructive sleep apnea (adult) (pediatric): Secondary | ICD-10-CM

## 2023-06-19 DIAGNOSIS — I4891 Unspecified atrial fibrillation: Secondary | ICD-10-CM

## 2023-06-19 NOTE — Addendum Note (Signed)
Addended by: Gwenlyn Fudge on: 06/19/2023 02:41 PM   Modules accepted: Orders

## 2023-06-19 NOTE — Telephone Encounter (Signed)
Prior Authorization for titration sent to [employer] via web portal. Tracking Number . NOT APPROVED: APAP SUGGESTED.   PER Dr Mayford Knife, If unable to perform an in lab titration then initiate ResMed auto CPAP from 4 to 15cm H2O with heated humidity and mask of choice and overnight pulse ox on CPAP.      Upon patient request DME selection is [facility], [facility]. Patient understands he will be contacted by [facility] to set up his cpap. Patient understands to call if [facility] does not contact him with new setup in a timely manner. Patient understands they will be called once confirmation has been received from [facility]/ that they have received their new machine to schedule 10 week follow up appointment.   [facility] notified of new cpap order  Please add to airview Patient was grateful for the call and thanked me.

## 2023-06-28 ENCOUNTER — Other Ambulatory Visit: Payer: Self-pay | Admitting: *Deleted

## 2023-06-28 MED ORDER — TRIAMCINOLONE ACETONIDE 0.5 % EX CREA: 1.0000 | TOPICAL_CREAM | Freq: Three times a day (TID) | CUTANEOUS | 1 refills | Status: AC

## 2023-07-04 ENCOUNTER — Telehealth: Payer: Self-pay

## 2023-07-04 NOTE — Telephone Encounter (Signed)
Spoke with patient, still has not received CPAP machine yet but instructed that [facility] is working on getting one thru his insurance. Instructed patient again to let Dr Morrie Sheldon office know once he has received this, as Dr Nelly Laurence would like for him to have a DCCV 1 month after using his CPAP. Patient verbalized understanding. No needs at this time

## 2023-08-01 DIAGNOSIS — M1990 Unspecified osteoarthritis, unspecified site: Secondary | ICD-10-CM | POA: Diagnosis not present

## 2023-08-01 DIAGNOSIS — Z7901 Long term (current) use of anticoagulants: Secondary | ICD-10-CM | POA: Diagnosis not present

## 2023-08-01 DIAGNOSIS — I10 Essential (primary) hypertension: Secondary | ICD-10-CM | POA: Diagnosis not present

## 2023-08-01 DIAGNOSIS — N529 Male erectile dysfunction, unspecified: Secondary | ICD-10-CM | POA: Diagnosis not present

## 2023-08-01 DIAGNOSIS — K5900 Constipation, unspecified: Secondary | ICD-10-CM | POA: Diagnosis not present

## 2023-08-01 DIAGNOSIS — Z823 Family history of stroke: Secondary | ICD-10-CM | POA: Diagnosis not present

## 2023-08-01 DIAGNOSIS — I4891 Unspecified atrial fibrillation: Secondary | ICD-10-CM | POA: Diagnosis not present

## 2023-08-01 DIAGNOSIS — L309 Dermatitis, unspecified: Secondary | ICD-10-CM | POA: Diagnosis not present

## 2023-08-01 DIAGNOSIS — J302 Other seasonal allergic rhinitis: Secondary | ICD-10-CM | POA: Diagnosis not present

## 2023-08-01 DIAGNOSIS — M545 Low back pain, unspecified: Secondary | ICD-10-CM | POA: Diagnosis not present

## 2023-08-01 DIAGNOSIS — Z87891 Personal history of nicotine dependence: Secondary | ICD-10-CM | POA: Diagnosis not present

## 2023-08-01 DIAGNOSIS — E785 Hyperlipidemia, unspecified: Secondary | ICD-10-CM | POA: Diagnosis not present

## 2023-08-21 ENCOUNTER — Telehealth: Payer: Self-pay

## 2023-08-21 NOTE — Telephone Encounter (Signed)
Patient will be called once confirmation has been received from [facility]/ that they have received their new machine to schedule 10 week follow up appointment.   [facility] notified of new cpap order  Please add to airview Patient was grateful for the call and thanked me.

## 2023-08-21 NOTE — Telephone Encounter (Signed)
Spoke with patient, still has not received CPAP machine. Waiting on [facility] still. No further needs at this time

## 2023-09-10 DIAGNOSIS — G4733 Obstructive sleep apnea (adult) (pediatric): Secondary | ICD-10-CM | POA: Diagnosis not present

## 2023-09-12 ENCOUNTER — Encounter: Payer: Self-pay | Admitting: Cardiovascular Disease

## 2023-09-12 ENCOUNTER — Ambulatory Visit: Payer: 59 | Attending: Cardiovascular Disease | Admitting: Cardiovascular Disease

## 2023-09-12 VITALS — BP 126/80 | HR 88 | Ht 68.0 in | Wt 256.6 lb

## 2023-09-12 DIAGNOSIS — I4819 Other persistent atrial fibrillation: Secondary | ICD-10-CM | POA: Diagnosis not present

## 2023-09-12 NOTE — Patient Instructions (Signed)
Medication Instructions:  Your physician recommends that you continue on your current medications as directed. Please refer to the Current Medication list given to you today. *If you need a refill on your cardiac medications before your next appointment, please call your pharmacy*   Lab Work: CBC and BMET TODAY If you have labs (blood work) drawn today and your tests are completely normal, you will receive your results only by: MyChart Message (if you have MyChart) OR A paper copy in the mail If you have any lab test that is abnormal or we need to change your treatment, we will call you to review the results.   Testing/Procedures: Atrial Fibrillation Ablation - see instruction letter Your physician has recommended that you have an ablation. Catheter ablation is a medical procedure used to treat some cardiac arrhythmias (irregular heartbeats). During catheter ablation, a long, thin, flexible tube is put into a blood vessel in your groin (upper thigh), or neck. This tube is called an ablation catheter. It is then guided to your heart through the blood vessel. Radio frequency waves destroy small areas of heart tissue where abnormal heartbeats may cause an arrhythmia to start. Please see the instruction sheet given to you today.   Follow-Up: At [facility], you and your health needs are our priority.  As part of our continuing mission to provide you with exceptional heart care, we have created designated Provider Care Teams.  These Care Teams include your primary Cardiologist (physician) and Advanced Practice Providers (APPs -  Physician Assistants and Nurse Practitioners) who all work together to provide you with the care you need, when you need it.  We recommend signing up for the patient portal called "MyChart".  Sign up information is provided on this After Visit Summary.  MyChart is used to connect with patients for Virtual Visits (Telemedicine).  Patients are able to view lab/test  results, encounter notes, upcoming appointments, etc.  Non-urgent messages can be sent to your provider as well.   To learn more about what you can do with MyChart, go to [email or web address].    Your next appointment:   We will schedule follow up after your ablation for you  Provider:   York Pellant, MD

## 2023-09-12 NOTE — H&P (View-Only) (Signed)
Electrophysiology Office Note:    Date:  09/12/2023   ID:  Joe Hawkins, DOB [birth year], MRN [id number]  PCP:  Tresa Garter, MD   [facility] Providers Cardiologist:  None Electrophysiologist:  Maurice Small, MD     Referring MD: Tresa Garter, MD   History of Present Illness:    Joe Hawkins is a 62 y.o. male with a hx significant for atrial fibrillation, referred for arrhythmia management.     He was recently diagnosed with atrial fibrillation detected incidentally at the time of a colonoscopy in August 2023. I reviewed several office visit notes from July and prior months, all of which documented normal rate and rhythm. He was given metoprolol and eliquis. In follow-up, he was hypotensive, and HR was 61 bpm, so betablocker was held. He underwent DC cardioversion with ERAF.   In the past he has noticed his heart "feels like there is something crawling on it..feel like there is a worm moving on it."   He was referred for a sleep evaluation after complaining of daytime somnolence. He was diagnosed with severe OSA.      He received his CPAP unit yesterday and is still getting accustomed to it. He remains in AF with very subtle symptoms.   EKGs/Labs/Other Studies Reviewed Today:    TTE 07/26/2022 EF 55-60%, mildly dilated LA  EKG:  Last EKG results: today - AF with right superior axis, Q waves anterior and inferior leads   Recent Labs: 09/25/2022: ALT 25 02/11/2023: Platelets 218 02/12/2023: BUN 19; Creatinine, Ser 1.10; Hemoglobin 16.7; Potassium 4.1; Sodium 143     Physical Exam:    VS:  BP 126/80 (BP Location: Left Arm, Patient Position: Sitting, Cuff Size: Large)   Pulse 88   Ht 5\' 8"  (1.727 m)   Wt 256 lb 9.6 oz (116.4 kg)   SpO2 95%   BMI 39.02 kg/m     Wt Readings from Last 3 Encounters:  09/12/23 256 lb 9.6 oz (116.4 kg)  06/17/23 254 lb (115.2 kg)  06/04/23 253 lb (114.8 kg)     GEN:  Well nourished, well developed in no acute  distress CARDIAC: Irregular rhythm, no murmurs, rubs, gallops RESPIRATORY:  Normal work of breathing MUSCULOSKELETAL: no edema    ASSESSMENT & PLAN:    Atrial fibrillation:  persistent, diagnosed incidentally.  Symptoms include palpitations.  S/p ablation 12/31/2022 with early recurrence. He had a cardioversion 3/26 but returned to clinic in atrial fibrillation.  his atrium is small and AF coarse on ECG -- I am surprised he has had such a recurrence of AF. I suspect sleep apnea has been contributing. Planning for repeat ablation 09/12/2023  We discussed the indication, rationale, logistics, anticipated benefits, and potential risks of the ablation procedure including but not limited to -- bleed at the groin access site, chest pain, damage to nearby organs such as the diaphragm, lungs, or esophagus, need for a drainage tube, or prolonged hospitalization. I explained that the risk for stroke, heart attack, need for open chest surgery, or even death is very low but not zero. he  expressed understanding and wishes to proceed.  Secondary hypercoagulable state:  CHADS2Vasc is 0 -- but he may have a cardioversion upcoming, so we'll continue eliquis for now.   Obstructive sleep apnea Recently diagnosed He has just started using CPAP        Medication Adjustments/Labs and Tests Ordered: Current medicines are reviewed at length with the patient today.  Concerns regarding medicines  are outlined above.  Orders Placed This Encounter  Procedures   EKG 12-Lead   No orders of the defined types were placed in this encounter.    Signed, Maurice Small, MD  09/12/2023 2:30 PM    [facility]

## 2023-09-12 NOTE — Progress Notes (Signed)
Electrophysiology Office Note:    Date:  09/12/2023   ID:  Joe Hawkins, DOB [birth year], MRN [id number]  PCP:  Tresa Garter, MD   [facility] Providers Cardiologist:  None Electrophysiologist:  Maurice Small, MD     Referring MD: Tresa Garter, MD   History of Present Illness:    Joe Hawkins is a 62 y.o. male with a hx significant for atrial fibrillation, referred for arrhythmia management.     He was recently diagnosed with atrial fibrillation detected incidentally at the time of a colonoscopy in August 2023. I reviewed several office visit notes from July and prior months, all of which documented normal rate and rhythm. He was given metoprolol and eliquis. In follow-up, he was hypotensive, and HR was 61 bpm, so betablocker was held. He underwent DC cardioversion with ERAF.   In the past he has noticed his heart "feels like there is something crawling on it..feel like there is a worm moving on it."   He was referred for a sleep evaluation after complaining of daytime somnolence. He was diagnosed with severe OSA.      He received his CPAP unit yesterday and is still getting accustomed to it. He remains in AF with very subtle symptoms.   EKGs/Labs/Other Studies Reviewed Today:    TTE 07/26/2022 EF 55-60%, mildly dilated LA  EKG:  Last EKG results: today - AF with right superior axis, Q waves anterior and inferior leads   Recent Labs: 09/25/2022: ALT 25 02/11/2023: Platelets 218 02/12/2023: BUN 19; Creatinine, Ser 1.10; Hemoglobin 16.7; Potassium 4.1; Sodium 143     Physical Exam:    VS:  BP 126/80 (BP Location: Left Arm, Patient Position: Sitting, Cuff Size: Large)   Pulse 88   Ht 5\' 8"  (1.727 m)   Wt 256 lb 9.6 oz (116.4 kg)   SpO2 95%   BMI 39.02 kg/m     Wt Readings from Last 3 Encounters:  09/12/23 256 lb 9.6 oz (116.4 kg)  06/17/23 254 lb (115.2 kg)  06/04/23 253 lb (114.8 kg)     GEN:  Well nourished, well developed in no acute  distress CARDIAC: Irregular rhythm, no murmurs, rubs, gallops RESPIRATORY:  Normal work of breathing MUSCULOSKELETAL: no edema    ASSESSMENT & PLAN:    Atrial fibrillation:  persistent, diagnosed incidentally.  Symptoms include palpitations.  S/p ablation 12/31/2022 with early recurrence. He had a cardioversion 3/26 but returned to clinic in atrial fibrillation.  his atrium is small and AF coarse on ECG -- I am surprised he has had such a recurrence of AF. I suspect sleep apnea has been contributing. Planning for repeat ablation 09/12/2023  We discussed the indication, rationale, logistics, anticipated benefits, and potential risks of the ablation procedure including but not limited to -- bleed at the groin access site, chest pain, damage to nearby organs such as the diaphragm, lungs, or esophagus, need for a drainage tube, or prolonged hospitalization. I explained that the risk for stroke, heart attack, need for open chest surgery, or even death is very low but not zero. he  expressed understanding and wishes to proceed.  Secondary hypercoagulable state:  CHADS2Vasc is 0 -- but he may have a cardioversion upcoming, so we'll continue eliquis for now.   Obstructive sleep apnea Recently diagnosed He has just started using CPAP        Medication Adjustments/Labs and Tests Ordered: Current medicines are reviewed at length with the patient today.  Concerns regarding medicines  are outlined above.  Orders Placed This Encounter  Procedures   EKG 12-Lead   No orders of the defined types were placed in this encounter.    Signed, Maurice Small, MD  09/12/2023 2:30 PM    [facility]

## 2023-09-13 LAB — BASIC METABOLIC PANEL
BUN/Creatinine Ratio: 17 (ref 10–24)
BUN: 18 mg/dL (ref 8–27)
CO2: 23 mmol/L (ref 20–29)
Calcium: 9.6 mg/dL (ref 8.6–10.2)
Chloride: 102 mmol/L (ref 96–106)
Creatinine, Ser: 1.05 mg/dL (ref 0.76–1.27)
Glucose: 86 mg/dL (ref 70–99)
Potassium: 4.4 mmol/L (ref 3.5–5.2)
Sodium: 142 mmol/L (ref 134–144)
eGFR: 79 mL/min/{1.73_m2} (ref >59)

## 2023-09-13 LAB — CBC
Hematocrit: 51.6 % — ABNORMAL HIGH (ref 37.5–51.0)
Hemoglobin: 16.2 g/dL (ref 13.0–17.7)
MCH: 25.3 pg — ABNORMAL LOW (ref 26.6–33.0)
MCHC: 31.4 g/dL — ABNORMAL LOW (ref 31.5–35.7)
MCV: 81 fL (ref 79–97)
Platelets: 245 10*3/uL (ref 150–450)
RBC: 6.41 x10E6/uL — ABNORMAL HIGH (ref 4.14–5.80)
RDW: 13.2 % (ref 11.6–15.4)
WBC: 8.3 10*3/uL (ref 3.4–10.8)

## 2023-09-16 ENCOUNTER — Ambulatory Visit: Payer: 59 | Admitting: Internal Medicine

## 2023-10-01 ENCOUNTER — Ambulatory Visit (HOSPITAL_COMMUNITY)
Admission: RE | Admit: 2023-10-01 | Discharge: 2023-10-01 | Disposition: A | Discharge status: Home / Self Care | Payer: 59 | Source: Ambulatory Visit | Attending: Cardiovascular Disease | Admitting: Cardiovascular Disease

## 2023-10-01 DIAGNOSIS — I4891 Unspecified atrial fibrillation: Secondary | ICD-10-CM | POA: Insufficient documentation

## 2023-10-01 MED ORDER — IOPAMIDOL (ISOVUE-370) INJECTION 76%
95.0000 mL | Freq: Once | INTRAVENOUS | Status: AC | PRN
  Administered 2023-10-01: 95 mL via INTRAVENOUS

## 2023-10-07 NOTE — Pre-Procedure Instructions (Signed)
Instructed patient on the following items: Arrival time 0730 Nothing to eat or drink after midnight No meds AM of procedure Responsible person to drive you home and stay with you for 24 hrs  Have you missed any doses of anti-coagulant- eliquis- takes twice a day, hasn't missed any doses.  Don't take dose in the morning.

## 2023-10-08 ENCOUNTER — Ambulatory Visit (HOSPITAL_COMMUNITY)
Admission: RE | Admit: 2023-10-08 | Discharge: 2023-10-08 | Disposition: A | Discharge status: Home / Self Care | Payer: 59 | Attending: Cardiovascular Disease | Admitting: Cardiovascular Disease

## 2023-10-08 ENCOUNTER — Ambulatory Visit (HOSPITAL_BASED_OUTPATIENT_CLINIC_OR_DEPARTMENT_OTHER): Payer: 59 | Admitting: Certified Registered Nurse Anesthetist

## 2023-10-08 ENCOUNTER — Other Ambulatory Visit: Payer: Self-pay

## 2023-10-08 ENCOUNTER — Ambulatory Visit (HOSPITAL_COMMUNITY): Payer: 59 | Admitting: Certified Registered Nurse Anesthetist

## 2023-10-08 ENCOUNTER — Encounter (HOSPITAL_COMMUNITY)
Admission: RE | Disposition: A | Discharge status: Home / Self Care | Payer: Self-pay | Source: Home / Self Care | Attending: Cardiovascular Disease

## 2023-10-08 DIAGNOSIS — Z87891 Personal history of nicotine dependence: Secondary | ICD-10-CM | POA: Insufficient documentation

## 2023-10-08 DIAGNOSIS — Z7901 Long term (current) use of anticoagulants: Secondary | ICD-10-CM | POA: Diagnosis not present

## 2023-10-08 DIAGNOSIS — I4891 Unspecified atrial fibrillation: Secondary | ICD-10-CM | POA: Diagnosis not present

## 2023-10-08 DIAGNOSIS — I4819 Other persistent atrial fibrillation: Secondary | ICD-10-CM | POA: Diagnosis not present

## 2023-10-08 DIAGNOSIS — D6869 Other thrombophilia: Secondary | ICD-10-CM | POA: Insufficient documentation

## 2023-10-08 DIAGNOSIS — G4733 Obstructive sleep apnea (adult) (pediatric): Secondary | ICD-10-CM | POA: Insufficient documentation

## 2023-10-08 DIAGNOSIS — E669 Obesity, unspecified: Secondary | ICD-10-CM | POA: Diagnosis not present

## 2023-10-08 DIAGNOSIS — K219 Gastro-esophageal reflux disease without esophagitis: Secondary | ICD-10-CM | POA: Diagnosis not present

## 2023-10-08 DIAGNOSIS — Z6841 Body Mass Index (BMI) 40.0 and over, adult: Secondary | ICD-10-CM | POA: Diagnosis not present

## 2023-10-08 HISTORY — PX: ATRIAL FIBRILLATION ABLATION: EP1191

## 2023-10-08 SURGERY — ATRIAL FIBRILLATION ABLATION
Anesthesia: General

## 2023-10-08 MED ORDER — ONDANSETRON HCL 4 MG/2ML IJ SOLN
INTRAMUSCULAR | Status: DC | PRN
  Administered 2023-10-08: 4 mg via INTRAVENOUS

## 2023-10-08 MED ORDER — SODIUM CHLORIDE 0.9 % IV SOLN: 250.0000 mL | INTRAVENOUS | Status: DC | PRN

## 2023-10-08 MED ORDER — SUGAMMADEX SODIUM 200 MG/2ML IV SOLN
INTRAVENOUS | Status: DC | PRN
  Administered 2023-10-08: 400 mg via INTRAVENOUS

## 2023-10-08 MED ORDER — SODIUM CHLORIDE 0.9 % IV SOLN: INTRAVENOUS | Status: DC

## 2023-10-08 MED ORDER — MIDAZOLAM HCL 5 MG/5ML IJ SOLN
INTRAMUSCULAR | Status: AC
  Filled 2023-10-08: qty 5

## 2023-10-08 MED ORDER — SODIUM CHLORIDE 0.9% FLUSH: 3.0000 mL | INTRAVENOUS | Status: DC | PRN

## 2023-10-08 MED ORDER — ACETAMINOPHEN 325 MG PO TABS: 650.0000 mg | ORAL_TABLET | ORAL | Status: DC | PRN

## 2023-10-08 MED ORDER — LIDOCAINE 2% (20 MG/ML) 5 ML SYRINGE
INTRAMUSCULAR | Status: DC | PRN
  Administered 2023-10-08: 100 mg via INTRAVENOUS

## 2023-10-08 MED ORDER — FENTANYL CITRATE (PF) 250 MCG/5ML IJ SOLN
INTRAMUSCULAR | Status: DC | PRN
  Administered 2023-10-08: 100 ug via INTRAVENOUS

## 2023-10-08 MED ORDER — SODIUM CHLORIDE 0.9% FLUSH: 3.0000 mL | Freq: Two times a day (BID) | INTRAVENOUS | Status: DC

## 2023-10-08 MED ORDER — ONDANSETRON HCL 4 MG/2ML IJ SOLN: 4.0000 mg | Freq: Four times a day (QID) | INTRAMUSCULAR | Status: DC | PRN

## 2023-10-08 MED ORDER — PHENYLEPHRINE HCL-NACL 20-0.9 MG/250ML-% IV SOLN
INTRAVENOUS | Status: DC | PRN
  Administered 2023-10-08: 40 ug/min via INTRAVENOUS

## 2023-10-08 MED ORDER — HEPARIN SODIUM (PORCINE) 1000 UNIT/ML IJ SOLN
INTRAMUSCULAR | Status: DC | PRN
  Administered 2023-10-08: 20000 [IU] via INTRAVENOUS

## 2023-10-08 MED ORDER — FENTANYL CITRATE (PF) 100 MCG/2ML IJ SOLN
INTRAMUSCULAR | Status: AC
  Filled 2023-10-08: qty 2

## 2023-10-08 MED ORDER — MIDAZOLAM HCL 2 MG/2ML IJ SOLN
INTRAMUSCULAR | Status: DC | PRN
  Administered 2023-10-08: 2 mg via INTRAVENOUS

## 2023-10-08 MED ORDER — ATROPINE SULFATE 1 MG/ML IV SOLN
INTRAVENOUS | Status: DC | PRN
  Administered 2023-10-08: 1 mg via INTRAVENOUS

## 2023-10-08 MED ORDER — ROCURONIUM BROMIDE 10 MG/ML (PF) SYRINGE
PREFILLED_SYRINGE | INTRAVENOUS | Status: DC | PRN
  Administered 2023-10-08: 20 mg via INTRAVENOUS
  Administered 2023-10-08: 60 mg via INTRAVENOUS

## 2023-10-08 MED ORDER — DEXAMETHASONE SODIUM PHOSPHATE 10 MG/ML IJ SOLN
INTRAMUSCULAR | Status: DC | PRN
  Administered 2023-10-08: 10 mg via INTRAVENOUS

## 2023-10-08 MED ORDER — PROTAMINE SULFATE 10 MG/ML IV SOLN
INTRAVENOUS | Status: DC | PRN
  Administered 2023-10-08: 50 mg via INTRAVENOUS

## 2023-10-08 MED ORDER — HEPARIN (PORCINE) IN NACL 1000-0.9 UT/500ML-% IV SOLN
INTRAVENOUS | Status: DC | PRN
  Administered 2023-10-08 (×3): 500 mL

## 2023-10-08 MED ORDER — PROPOFOL 10 MG/ML IV BOLUS
INTRAVENOUS | Status: DC | PRN
  Administered 2023-10-08: 150 mg via INTRAVENOUS

## 2023-10-08 SURGICAL SUPPLY — 21 items
BAG SNAP BAND KOVER 36X36 (MISCELLANEOUS) IMPLANT
BLANKET WARM UNDERBOD FULL ACC (MISCELLANEOUS) ×1 IMPLANT
CABLE PFA RX CATH CONN (CABLE) IMPLANT
CATH FARAWAVE ABLATION 31 (CATHETERS) IMPLANT
CATH OCTARAY 2.0 F 3-3-3-3-3 (CATHETERS) IMPLANT
CATH SOUNDSTAR ECO 8FR (CATHETERS) IMPLANT
CATH WEBSTER BI DIR CS D-F CRV (CATHETERS) IMPLANT
CLOSURE PERCLOSE PROSTYLE (VASCULAR PRODUCTS) IMPLANT
COVER SWIFTLINK CONNECTOR (BAG) ×1 IMPLANT
DEVICE CLOSURE MYNXGRIP 6/7F (Vascular Products) IMPLANT
DILATOR VESSEL 38 20CM 16FR (INTRODUCER) IMPLANT
GUIDEWIRE INQWIRE 1.5J.035X260 (WIRE) IMPLANT
INQWIRE 1.5J .035X260CM (WIRE) ×1
PACK EP LF (CUSTOM PROCEDURE TRAY) ×1 IMPLANT
PAD DEFIB RADIO PHYSIO CONN (PAD) ×1 IMPLANT
PATCH CARTO3 (PAD) IMPLANT
SHEATH FARADRIVE STEERABLE (SHEATH) IMPLANT
SHEATH PINNACLE 8F 10CM (SHEATH) IMPLANT
SHEATH PINNACLE 9F 10CM (SHEATH) IMPLANT
SHEATH PROBE COVER 6X72 (BAG) IMPLANT
SHEATH WIRE KIT BAYLIS SL1 (KITS) IMPLANT

## 2023-10-08 NOTE — Transfer of Care (Signed)
Immediate Anesthesia Transfer of Care Note  Patient: Joe Hawkins  Procedure(s) Performed: ATRIAL FIBRILLATION ABLATION  Patient Location: PACU  Anesthesia Type:General  Level of Consciousness: awake, alert , oriented, and patient cooperative  Airway & Oxygen Therapy: Patient Spontanous Breathing and Patient connected to face mask oxygen  Post-op Assessment: Report given to RN and Post -op Vital signs reviewed and stable  Post vital signs: Reviewed and stable  Last Vitals:  Vitals Value Taken Time  BP 104/62 10/08/23 1107  Temp    Pulse 78 10/08/23 1109  Resp 21 10/08/23 1109  SpO2 98 % 10/08/23 1109  Vitals shown include unfiled device data.  Last Pain:  Vitals:   10/08/23 0809  TempSrc: Oral  PainSc:          Complications: No notable events documented.

## 2023-10-08 NOTE — Anesthesia Procedure Notes (Signed)
Procedure Name: Intubation Date/Time: 10/08/2023 9:38 AM  Performed by: Nadara Mustard, CRNAPre-anesthesia Checklist: Patient identified, Emergency Drugs available, Suction available and Patient being monitored Patient Re-evaluated:Patient Re-evaluated prior to induction Oxygen Delivery Method: Circle system utilized Preoxygenation: Pre-oxygenation with 100% oxygen Induction Type: IV induction Ventilation: Two handed mask ventilation required and Mask ventilation with difficulty Laryngoscope Size: Glidescope and 4 Grade View: Grade I Tube type: Oral Tube size: 7.5 mm Number of attempts: 1 Airway Equipment and Method: Stylet and Oral airway Placement Confirmation: ETT inserted through vocal cords under direct vision, positive ETCO2 and breath sounds checked- equal and bilateral Secured at: 23 cm Tube secured with: Tape Dental Injury: Teeth and Oropharynx as per pre-operative assessment

## 2023-10-08 NOTE — Interval H&P Note (Signed)
History and Physical Interval Note:  10/08/2023 9:09 AM  Joe Hawkins  has presented today for surgery, with the diagnosis of afib.  The various methods of treatment have been discussed with the patient and family. After consideration of risks, benefits and other options for treatment, the patient has consented to  Procedure(s): ATRIAL FIBRILLATION ABLATION (N/A) as a surgical intervention.  The patient's history has been reviewed, patient examined, no change in status, stable for surgery.  I have reviewed the patient's chart and labs.  Questions were answered to the patient's satisfaction.    I reviewed the patient's CT and labs. There was no LAA thrombus. he  has not missed any doses of anticoagulation, and he took his dose last night. There have been no changes in the patient's diagnoses, medications, or condition since our recent clinic visit.   Roberts Gaudy Emmily Pellegrin

## 2023-10-08 NOTE — Progress Notes (Signed)
On arrival from cath lab holding, client with O2 sats 85-88; placed oxygen a 2l/min

## 2023-10-08 NOTE — Anesthesia Preprocedure Evaluation (Signed)
Anesthesia Evaluation  Patient identified by MRN, date of birth, ID band Patient awake    Reviewed: Allergy & Precautions, NPO status , Patient's Chart, lab work & pertinent test results  History of Anesthesia Complications Negative for: history of anesthetic complications  Airway Mallampati: III  TM Distance: >3 FB Neck ROM: Full   Comment: Previous airway grade I view with Glidescope 3, easy mask Dental  (+) Dental Advisory Given   Pulmonary neg shortness of breath, neg sleep apnea, neg COPD, neg recent URI, former smoker   Pulmonary exam normal breath sounds clear to auscultation       Cardiovascular (-) hypertension(-) angina (-) Past MI and (-) Cardiac Stents + dysrhythmias (on Eliquis) Atrial Fibrillation  Rhythm:Irregular Rate:Normal  TTE 07/26/2022: IMPRESSIONS     1. Left ventricular ejection fraction, by estimation, is 55 to 60%. The  left ventricle has normal function. The left ventricle has no regional  wall motion abnormalities. Left ventricular diastolic parameters are  indeterminate.   2. Right ventricular systolic function is mildly reduced. The right  ventricular size is mildly enlarged. There is normal pulmonary artery  systolic pressure. The estimated right ventricular systolic pressure is  22.3 mmHg.   3. Left atrial size was mildly dilated.   4. The mitral valve is normal in structure. No evidence of mitral valve  regurgitation.   5. The aortic valve is tricuspid. Aortic valve regurgitation is not  visualized. Aortic valve sclerosis/calcification is present, without any  evidence of aortic stenosis.   6. The inferior vena cava is normal in size with <50% respiratory  variability, suggesting right atrial pressure of 8 mmHg.     Neuro/Psych  Headaches  Neuromuscular disease (lumbar radiculopathy)    GI/Hepatic Neg liver ROS,GERD  Medicated,,  Endo/Other  negative endocrine ROS    Renal/GU negative  Renal ROS     Musculoskeletal   Abdominal  (+) + obese  Peds  Hematology negative hematology ROS (+)   Anesthesia Other Findings   Reproductive/Obstetrics                              Anesthesia Physical Anesthesia Plan  ASA: 3  Anesthesia Plan: General   Post-op Pain Management:    Induction: Intravenous  PONV Risk Score and Plan: 2 and Treatment may vary due to age or medical condition, Ondansetron and Dexamethasone  Airway Management Planned: Oral ETT  Additional Equipment:   Intra-op Plan:   Post-operative Plan:   Informed Consent: I have reviewed the patients History and Physical, chart, labs and discussed the procedure including the risks, benefits and alternatives for the proposed anesthesia with the patient or authorized representative who has indicated his/her understanding and acceptance.     Dental advisory given  Plan Discussed with: CRNA and Anesthesiologist  Anesthesia Plan Comments: (Risks of general anesthesia discussed including, but not limited to, sore throat, hoarse voice, chipped/damaged teeth, injury to vocal cords, nausea and vomiting, allergic reactions, lung infection, heart attack, stroke, and death. All questions answered. )         Anesthesia Quick Evaluation

## 2023-10-08 NOTE — Discharge Instructions (Addendum)
Cardiac Ablation, Care After  This sheet gives you information about how to care for yourself after your procedure. Your health care provider may also give you more specific instructions. If you have problems or questions, contact your health care provider. What can I expect after the procedure? After the procedure, it is common to have: Bruising around your puncture site. Tenderness around your puncture site. Skipped heartbeats. If you had an atrial fibrillation ablation, you may have atrial fibrillation during the first several months after your procedure.  Tiredness (fatigue).  Follow these instructions at home: Puncture site care  Follow instructions from your health care provider about how to take care of your puncture site.  Make sure you remove dressing at 12:00 pm tomorrow. Check your puncture site every day for signs of infection. Check for: Redness, swelling, or pain. Fluid or blood. If your puncture site starts to bleed, lie down on your back, apply firm pressure to the area, and contact your health care provider. Warmth. Pus or a bad smell. A pea or small marble sized lump at the site is normal and can take up to three months to resolve.  Driving Do not drive for at least 4 days after your procedure or however long your health care provider recommends. (Do not resume driving if you have previously been instructed not to drive for other health reasons.) Do not drive or use heavy machinery while taking prescription pain medicine. Activity Avoid activities that take a lot of effort for at least 7 days after your procedure. Do not lift anything that is heavier than 5 lb (4.5 kg) for one week.  No sexual activity for 1 week.  Return to your normal activities as told by your health care provider. Ask your health care provider what activities are safe for you. General instructions Take over-the-counter and prescription medicines only as told by your health care provider. Do not use  any products that contain nicotine or tobacco, such as cigarettes and e-cigarettes. If you need help quitting, ask your health care provider. You may shower after you remove dressing, but Do not take baths, swim, or use a hot tub for 1 week.  Do not drink alcohol for 24 hours after your procedure. Keep all follow-up visits as told by your health care provider. This is important. Contact a health care provider if: You have redness, mild swelling, or pain around your puncture site. You have fluid or blood coming from your puncture site that stops after applying firm pressure to the area. Your puncture site feels warm to the touch. You have pus or a bad smell coming from your puncture site. You have a fever. You have chest pain or discomfort that spreads to your neck, jaw, or arm. You have chest pain that is worse with lying on your back or taking a deep breath. You are sweating a lot. You feel nauseous. You have a fast or irregular heartbeat. You have shortness of breath. You are dizzy or light-headed and feel the need to lie down. You have pain or numbness in the arm or leg closest to your puncture site. Get help right away if: Your puncture site suddenly swells. Your puncture site is bleeding and the bleeding does not stop after applying firm pressure to the area. These symptoms may represent a serious problem that is an emergency. Do not wait to see if the symptoms will go away. Get medical help right away. Call your local emergency services (911 in the [place] Do  not drive yourself to the hospital. Summary After the procedure, it is normal to have bruising and tenderness at the puncture site in your groin, neck, or forearm. Check your puncture site every day for signs of infection. Get help right away if your puncture site is bleeding and the bleeding does not stop after applying firm pressure to the area. This is a medical emergency. This information is not intended to replace advice  given to you by your health care provider. Make sure you discuss any questions you have with your health care provider.

## 2023-10-09 ENCOUNTER — Encounter (HOSPITAL_COMMUNITY): Payer: Self-pay | Admitting: Cardiovascular Disease

## 2023-10-09 LAB — POCT ACTIVATED CLOTTING TIME: Activated Clotting Time: 412 s

## 2023-10-09 MED FILL — Fentanyl Citrate Preservative Free (PF) Inj 100 MCG/2ML: INTRAMUSCULAR | Qty: 2 | Status: AC

## 2023-10-09 MED FILL — Midazolam HCl Inj 5 MG/5ML (Base Equivalent): INTRAMUSCULAR | Qty: 2 | Status: AC

## 2023-10-10 NOTE — Anesthesia Postprocedure Evaluation (Signed)
Anesthesia Post Note  Patient: Joe Hawkins  Procedure(s) Performed: ATRIAL FIBRILLATION ABLATION     Patient location during evaluation: PACU Anesthesia Type: General Level of consciousness: awake and alert Pain management: pain level controlled Vital Signs Assessment: post-procedure vital signs reviewed and stable Respiratory status: spontaneous breathing, nonlabored ventilation, respiratory function stable and patient connected to nasal cannula oxygen Cardiovascular status: blood pressure returned to baseline and stable Postop Assessment: no apparent nausea or vomiting Anesthetic complications: no   No notable events documented.  Last Vitals:  Vitals:   10/08/23 1400 10/08/23 1453  BP: (!) 118/91 121/80  Pulse: 81 85  Resp:    Temp:    SpO2: 96% 91%    Last Pain:  Vitals:   10/09/23 0854  TempSrc:   PainSc: 0-No pain                 [place]

## 2023-10-11 DIAGNOSIS — G4733 Obstructive sleep apnea (adult) (pediatric): Secondary | ICD-10-CM | POA: Diagnosis not present

## 2023-10-29 DIAGNOSIS — Z125 Encounter for screening for malignant neoplasm of prostate: Secondary | ICD-10-CM | POA: Diagnosis not present

## 2023-10-29 DIAGNOSIS — R3121 Asymptomatic microscopic hematuria: Secondary | ICD-10-CM | POA: Diagnosis not present

## 2023-11-05 ENCOUNTER — Ambulatory Visit (HOSPITAL_COMMUNITY)
Admit: 2023-11-05 | Discharge: 2023-11-05 | Disposition: A | Discharge status: Home / Self Care | Payer: 59 | Source: Ambulatory Visit | Attending: Internal Medicine | Admitting: Internal Medicine

## 2023-11-05 VITALS — BP 116/74 | HR 81 | Ht 66.0 in | Wt 255.2 lb

## 2023-11-05 DIAGNOSIS — R9431 Abnormal electrocardiogram [ECG] [EKG]: Secondary | ICD-10-CM | POA: Diagnosis not present

## 2023-11-05 DIAGNOSIS — I4819 Other persistent atrial fibrillation: Secondary | ICD-10-CM | POA: Diagnosis not present

## 2023-11-05 DIAGNOSIS — I4891 Unspecified atrial fibrillation: Secondary | ICD-10-CM | POA: Diagnosis not present

## 2023-11-05 DIAGNOSIS — Z7901 Long term (current) use of anticoagulants: Secondary | ICD-10-CM | POA: Diagnosis not present

## 2023-11-05 DIAGNOSIS — I4519 Other right bundle-branch block: Secondary | ICD-10-CM | POA: Diagnosis not present

## 2023-11-05 NOTE — Progress Notes (Signed)
Primary Care Physician: Tresa Garter, MD Referring Physician: Dr. Adela Lank  Primary EP: Dr Durward Mallard is a 62 y.o. male with a h/o Migraine h/a's and allergies, that presented for colonoscopy yesterday  for abdominal pain and was found to be in afib with rate controlled, asymptomatic. Procedure was cancelled and he was asked to come here for appointment this am. 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 He is here today with his wife and an interpretor. His wife speaks English very well. His ekg today continues to  show afib, rate controlled in the 80's. He is still unaware.   He drinks very rare alcohol, no tobacco. Minimal caffeine.  His wife indicates that he snores with witnessed apnea. She states he has not slept well in some time.   I started him on low dose metoprolol and eliquis 5 mg bid for a CHA2DS2VASc  score of 0, in case cardioversion was needed.   After he left, I found a phone note form Dr. Adela Lank that stated he had a colonic mass and would need colonoscopy as asap.   I called the wife and told her not to take the eliquis (he had taken one already) but  continue the low dose BB. He was scheduled to see me back in one week. He is now scheduled to get an echo at 8 am and I will see at 9 am after the echo. If all looks acceptable, he then can reschedule colonoscopy.   F/u one week 07/26/22. He was started on low dose BB with afib at 61 bpm but with a BP of 86/62 but for now will stop BB as I fear with colonic prep he may get syncopal. He had good rate control with afib on last visit with HR's in the 80's. Was hoping that possibly start of BB may help to restore SR. He had an echo earlier today and waiting the results. If unremarkable he go proceed to colonoscopy and restoring SR will be later addressed. He appears to be asymptomatic.   Follow up in the AF clinic 08/21/22. An interpreter was used for this visit. Patient remains in rate  controlled afib and is unaware of his arrhythmia. He resumed Eliquis but has missed 3 doses. No bleeding issues on anticoagulation.   F/u in afib clinic, 09/19/22. He had a successful cardioversion but found to have ERAF today. He is rate controlled and asymptomatic. He is here by himself today but speaks English very well.   Follow up in the AF clinic 01/28/23. Patient is s/p afib ablation with Dr Nelly Laurence on 12/31/22. Successful ablation without complication. He has been compliant with Eliquis, no missed doses, no bleeding concerns. He denies any chest pain, shortness of breath, or trouble swallowing. Leg incision sites healed without issue. Historically, he is unable to tell when he is in Afib. He is in rate controlled afib today.   On follow up today, 02/19/23. Patient is s/p DCCV on 02/12/23 with successful conversion to SR x 1 shock. Since then, he has been doing well overall. Unfortunately, he is back in Afib today. He is still on Eliquis 5 mg BID due to protocol post ablation. No missed doses of anticoagulation. No bleeding concerns. He cannot feel his Afib, but does tell me with help of interpreter that perhaps 3 days ago he felt a slight pressing sensation in his left chest. Overall, he is not completely sure. No chest pain or  shortness of breath or fatigue.   On follow up 11/05/23, patient is currently in NSR. S/p Afib ablation on 10/08/23 by Dr. Nelly Laurence. No episodes of Afib since ablation. No chest pain or SOB. Leg sites healed without issue. He feels more energy compared to previous. No missed doses of anticoagulant.  Today, he denies symptoms of orthopnea, PND, lower extremity edema, dizziness, presyncope, syncope, snoring, daytime somnolence, bleeding, or neurologic sequela. The patient is tolerating medications without difficulties and is otherwise without complaint today.    Ablation: 12/31/22, 10/08/23 Cardioversion: 02/12/23 Antiarrhythmic: None Anticoagulation: Eliquis 5 mg BID  Past  Medical History:  Diagnosis Date   A-fib (HCC)    Allergic rhinitis    GERD (gastroesophageal reflux disease)    Low back pain    Sinusitis    Dr Pollyann Kennedy   Tinnitus 11/19/2009   Past Surgical History:  Procedure Laterality Date   ATRIAL FIBRILLATION ABLATION N/A 12/31/2022   Procedure: ATRIAL FIBRILLATION ABLATION;  Surgeon: Maurice Small, MD;  [facility];  Service: Cardiovascular;  Laterality: N/A;   ATRIAL FIBRILLATION ABLATION N/A 10/08/2023   Procedure: ATRIAL FIBRILLATION ABLATION;  Surgeon: Maurice Small, MD;  Location: [facility];  Service: Cardiovascular;  Laterality: N/A;   CARDIOVERSION N/A 09/12/2022   Procedure: CARDIOVERSION;  Surgeon: Meriam Sprague, MD;  Location: [facility] ENDOSCOPY;  Service: Cardiovascular;  Laterality: N/A;   CARDIOVERSION N/A 02/12/2023   Procedure: CARDIOVERSION;  Surgeon: Meriam Sprague, MD;  Location: [facility] ENDOSCOPY;  Service: Cardiovascular;  Laterality: N/A;   CATARACT EXTRACTION     LUMBAR DISC SURGERY  11/19/2005   NASAL SINUS SURGERY  11/19/2006   TRANSFORAMINAL LUMBAR INTERBODY FUSION W/ MIS 1 LEVEL Left 12/26/2021   Procedure: Minimally Invasive Transforaminal Lumbar Interbody Fusion, Left Lumbar [id number];  Surgeon: Bedelia Person, MD;  Location: [facility] OR;  Service: Neurosurgery;  Laterality: Left;  Minimally Invasive Transforaminal Lumbar Interbody Fusion, Left Lumbar Four-Five45    Current Outpatient Medications  Medication Sig Dispense Refill   acetaminophen (TYLENOL) 500 MG tablet Take 500-1,000 mg by mouth as needed (pain.).     apixaban (ELIQUIS) 5 MG TABS tablet Take 1 tablet (5 mg total) by mouth 2 (two) times daily. 60 tablet 3   EPINEPHrine (EPIPEN) 0.3 mg/0.3 mL DEVI Inject 0.3 mLs (0.3 mg total) into the muscle once. 1 Device 0   fluticasone (FLONASE) 50 MCG/ACT nasal spray Place 2 sprays into both nostrils daily. (Patient taking differently: Place 2 sprays into both nostrils daily as  needed for allergies.) 16 g 6   levocetirizine (XYZAL) 5 MG tablet Take 1 tablet (5 mg total) by mouth daily as needed for allergies. 30 tablet 11   Polyethyl Glycol-Propyl Glycol (SYSTANE) 0.4-0.3 % SOLN Place 1-2 drops into both eyes 3 (three) times daily as needed (dry/irritated eyes.).     triamcinolone cream (KENALOG) 0.5 % Apply 1 Application topically 3 (three) times daily. (Patient taking differently: Apply 1 Application topically 3 (three) times daily as needed (skin irritation).) 45 g 1   Trolamine Salicylate (BLUE-EMU MAXIMUM PAIN RELIEF EX) Apply 1 Application topically 3 (three) times daily as needed (pain.).     No current facility-administered medications for this encounter.    Allergies  Allergen Reactions   Shellfish Allergy Itching    Crab, lobsters and shrimp.    ROS- All systems are reviewed and negative except as per the HPI above  Physical Exam: Vitals:   11/05/23 1300  BP: 116/74  Pulse:  81  Weight: 115.8 kg  Height: 5\' 6"  (1.676 m)     Wt Readings from Last 3 Encounters:  11/05/23 115.8 kg  10/08/23 113.4 kg  09/12/23 116.4 kg    Labs: Lab Results  Component Value Date   NA 142 09/12/2023   K 4.4 09/12/2023   CL 102 09/12/2023   CO2 23 09/12/2023   GLUCOSE 86 09/12/2023   BUN 18 09/12/2023   CREATININE 1.05 09/12/2023   CALCIUM 9.6 09/12/2023   No results found for: "INR" Lab Results  Component Value Date   CHOL 195 05/05/2021   HDL 47.60 05/05/2021   LDLCALC 117 (H) 05/05/2021   TRIG 154.0 (H) 05/05/2021    GEN- The patient is well appearing, alert and oriented x 3 today.   Neck - no JVD or carotid bruit noted Lungs- Clear to ausculation bilaterally, normal work of breathing Heart- Regular rate and rhythm, no murmurs, rubs or gallops, PMI not laterally displaced Extremities- no clubbing, cyanosis, or edema Skin - no rash or ecchymosis noted   ECG today demonstrates Vent. rate 81 BPM PR interval 184 ms QRS duration 96 ms QT/QTcB  364/422 ms P-R-T axes 56 268 33 Normal sinus rhythm Right superior axis deviation Incomplete right bundle branch block Right ventricular hypertrophy Inferior infarct , age undetermined Cannot rule out Anterior infarct , age undetermined Abnormal ECG When compared with ECG of 08-Oct-2023 11:12, PREVIOUS ECG IS PRESENT  Echo 07/26/22  1. Left ventricular ejection fraction, by estimation, is 55 to 60%. The left ventricle has normal function. The left ventricle has no regional wall motion abnormalities. Left ventricular diastolic parameters are indeterminate.   2. Right ventricular systolic function is mildly reduced. The right ventricular size is mildly enlarged. There is normal pulmonary artery systolic pressure. The estimated right ventricular systolic pressure is 22.3 mmHg.   3. Left atrial size was mildly dilated.   4. The mitral valve is normal in structure. No evidence of mitral valve regurgitation.   5. The aortic valve is tricuspid. Aortic valve regurgitation is not  visualized. Aortic valve sclerosis/calcification is present, without any evidence of aortic stenosis.   6. The inferior vena cava is normal in size with <50% respiratory variability, suggesting right atrial pressure of 8 mmHg.    CHA2DS2-VASc Score = 0  The patient's score is based upon: CHF History: 0 HTN History: 0 Diabetes History: 0 Stroke History: 0 Vascular Disease History: 0 Age Score: 0 Gender Score: 0      ASSESSMENT AND PLAN: 1. Persistent Atrial Fibrillation (ICD10:  I48.19) The patient's CHA2DS2-VASc score is 0, indicating a 0.2% annual risk of stroke.   S/p ablation on 12/31/22. S/p DCCV on 02/12/23. S/p Afib ablation on 10/08/23 by Dr. Nelly Laurence.  He is currently in NSR.   Continue Eliquis 5 mg BID with no missed doses for 3 months post ablation. Anticipate this will be discontinued at next office visit.  Historically, he did not tolerate low dose BB due to significant bradycardia and hypotension    Patient admits previously that he does not have cardiac awareness of arrhythmia.    Follow up as scheduled with Dr. Nelly Laurence.   Lake Bells, PA-C Afib Clinic [facility] [address] [place], [place] [address] [phone]

## 2023-11-06 IMAGING — DX DG ABDOMEN 2V
2 series · 2 of 2 positions shown · non-contrast
Comparison: None.

CLINICAL DATA: Left lower quadrant abdominal pain.

EXAM:
ABDOMEN - 2 VIEW

[abdomen erect]
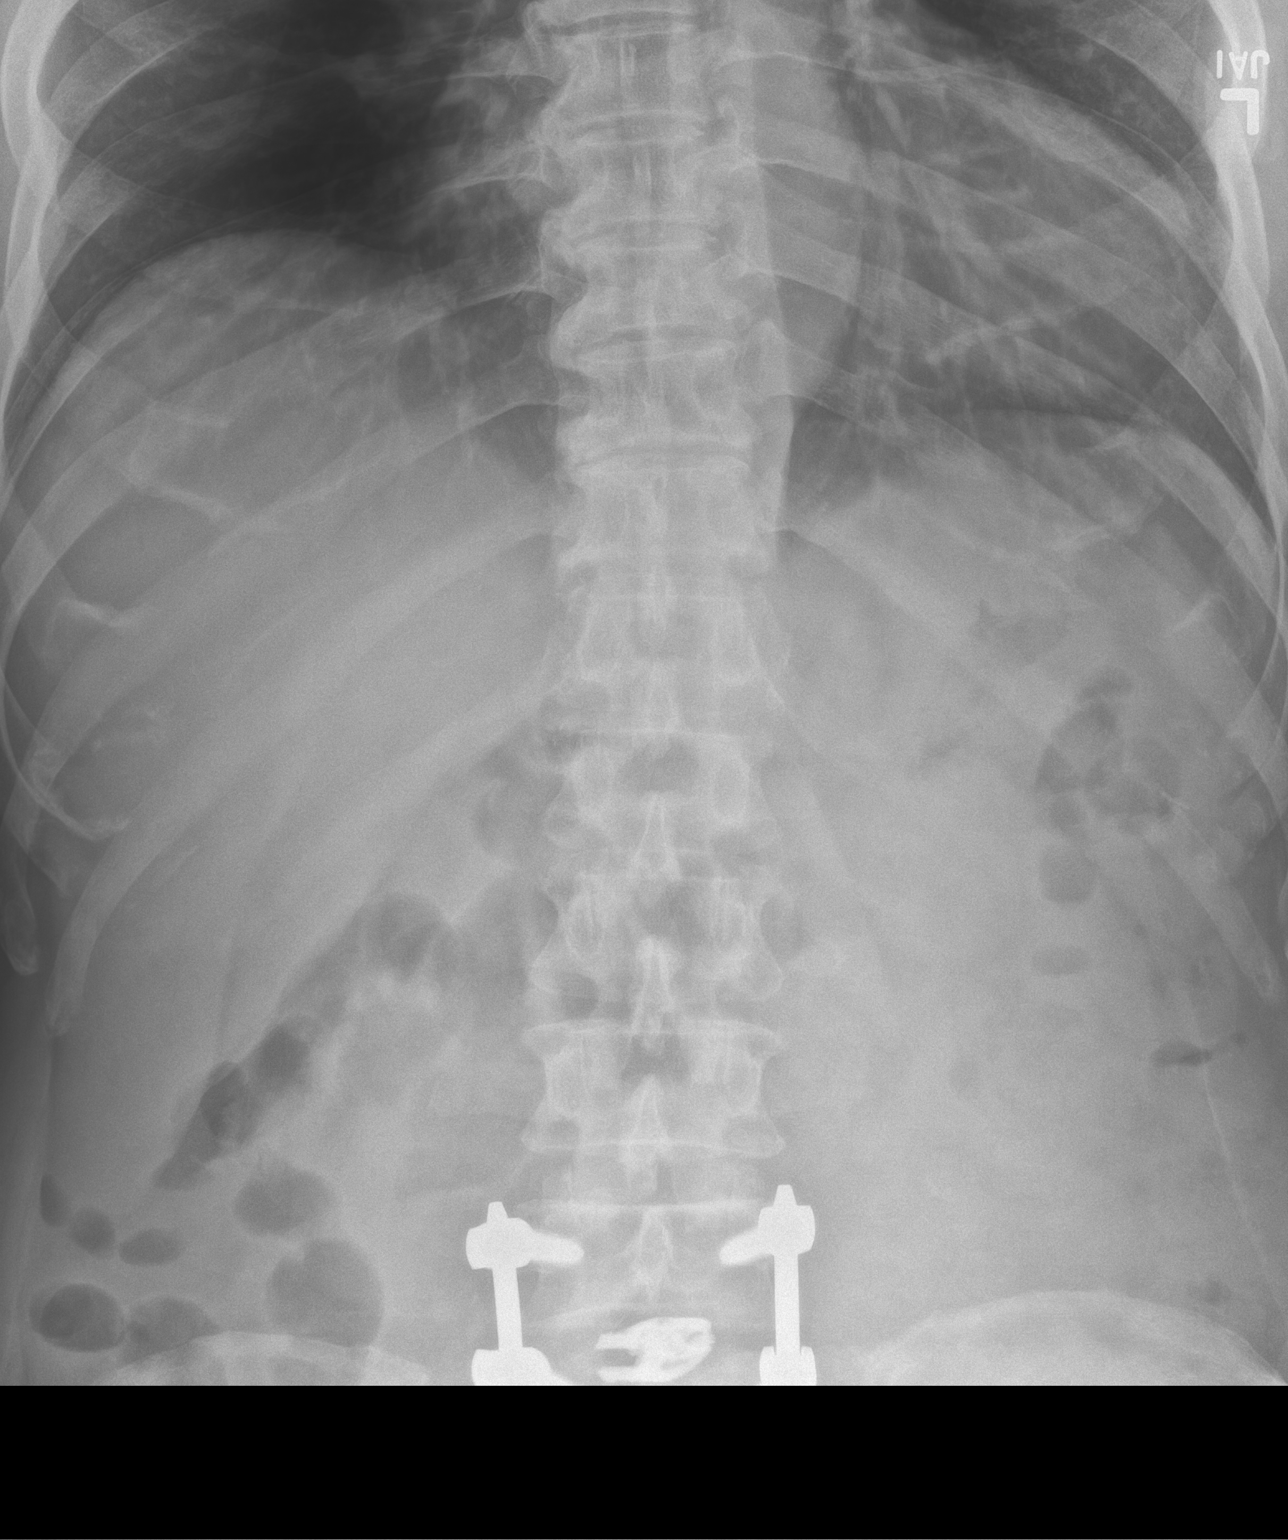

[abdomen supine]
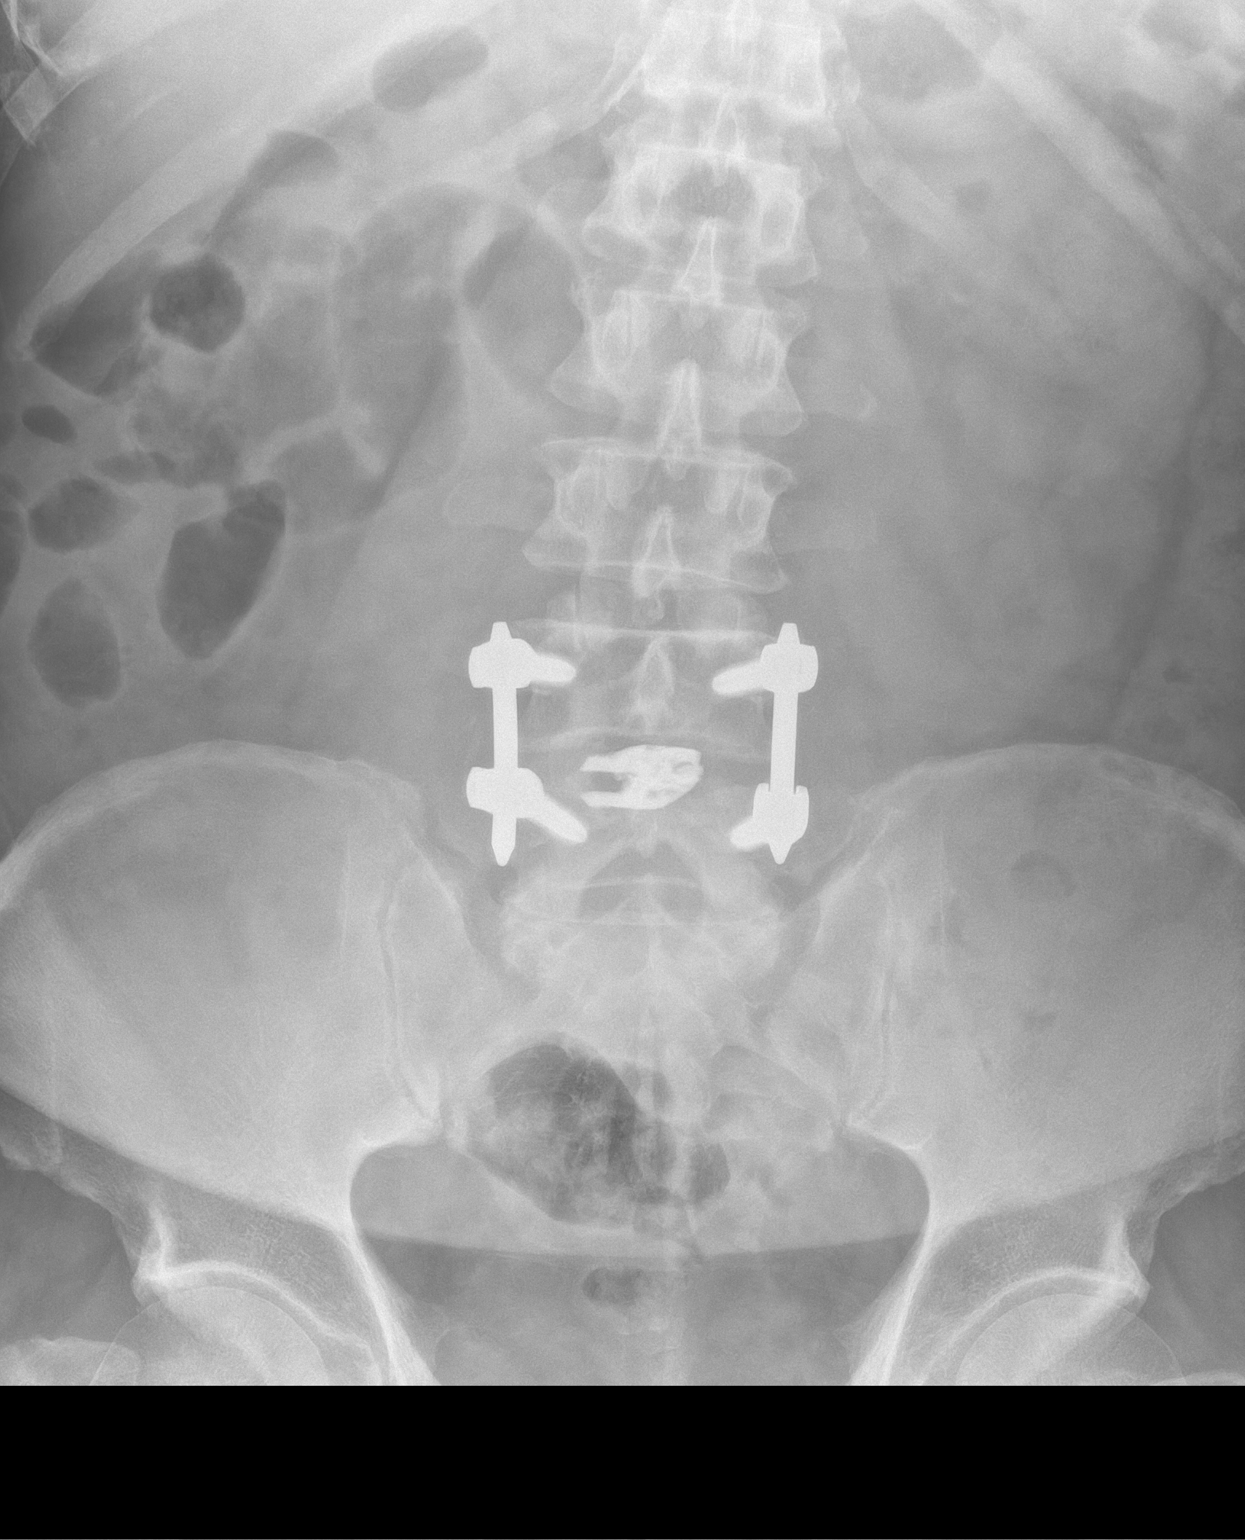

[2 of 2 positions shown; findings below may reference images not displayed]

FINDINGS: There is some mildly dilated air-filled small bowel loops measuring
up to 3.2 cm in the right abdomen. Air seen throughout nondilated
colon to the level of the rectum. Lung bases are clear. L4-5
posterior fusion hardware is present. No suspicious calcifications
are seen.
IMPRESSION: 1. Mildly dilated loops of small bowel in the right abdomen may
represent ileus or enteritis. Developing partial small bowel
obstruction is not excluded.

## 2023-11-10 DIAGNOSIS — G4733 Obstructive sleep apnea (adult) (pediatric): Secondary | ICD-10-CM | POA: Diagnosis not present

## 2023-11-12 ENCOUNTER — Encounter: Payer: Self-pay | Admitting: Internal Medicine

## 2023-11-12 ENCOUNTER — Ambulatory Visit (INDEPENDENT_AMBULATORY_CARE_PROVIDER_SITE_OTHER): Payer: 59 | Admitting: Internal Medicine

## 2023-11-12 VITALS — BP 122/74 | HR 70 | Temp 98.6°F | Ht 66.0 in | Wt 255.0 lb

## 2023-11-12 DIAGNOSIS — Z23 Encounter for immunization: Secondary | ICD-10-CM | POA: Diagnosis not present

## 2023-11-12 DIAGNOSIS — J069 Acute upper respiratory infection, unspecified: Secondary | ICD-10-CM

## 2023-11-12 DIAGNOSIS — J3089 Other allergic rhinitis: Secondary | ICD-10-CM | POA: Diagnosis not present

## 2023-11-12 DIAGNOSIS — K219 Gastro-esophageal reflux disease without esophagitis: Secondary | ICD-10-CM

## 2023-11-12 MED ORDER — AZITHROMYCIN 250 MG PO TABS: ORAL_TABLET | ORAL | 1 refills | Status: AC

## 2023-11-12 NOTE — Patient Instructions (Addendum)
Please take all new medication as prescribed - the antibiotic  You can also take Delsym OTC for cough, and/or Mucinex (or it's generic off brand) for congestion, and tylenol as needed for pain.  Please continue all other medications as before, and refills have been done if requested.  Please have the pharmacy call with any other refills you may need.  Please keep your appointments with your specialists as you may have planned    

## 2023-11-12 NOTE — Progress Notes (Signed)
Patient ID: Joe Hawkins, male   DOB: 1961/03/01, 62 y.o.   MRN: [id number]        Chief Complaint: follow up uri symptoms, allergies, reflux       HPI:  Joe Hawkins is a 62 y.o. male  Here with 2-3 days acute onset fever, facial pain, pressure, headache, general weakness and malaise, and greenish d/c, with mild ST and cough, but pt denies chest pain, wheezing, increased sob or doe, orthopnea, PND, increased LE swelling, palpitations, dizziness or syncope.   Pt denies polydipsia, polyuria, or new focal neuro s/s.   Does have several wks ongoing nasal allergy symptoms with clearish congestion, itch and sneezing, without fever, pain, ST, cough, swelling or wheezing.  Denies worsening reflux, abd pain, dysphagia, n/v, bowel change or blood.        Wt Readings from Last 3 Encounters:  11/12/23 255 lb (115.7 kg)  11/05/23 255 lb 3.2 oz (115.8 kg)  10/08/23 250 lb (113.4 kg)   BP Readings from Last 3 Encounters:  11/12/23 122/74  11/05/23 116/74  10/08/23 121/80         Past Medical History:  Diagnosis Date   A-fib [facility])    Allergic rhinitis    GERD (gastroesophageal reflux disease)    Low back pain    Sinusitis    Dr Pollyann Kennedy   Tinnitus 11/19/2009   Past Surgical History:  Procedure Laterality Date   ATRIAL FIBRILLATION ABLATION N/A 12/31/2022   Procedure: ATRIAL FIBRILLATION ABLATION;  Surgeon: Maurice Small, MD;  Location: [facility];  Service: Cardiovascular;  Laterality: N/A;   ATRIAL FIBRILLATION ABLATION N/A 10/08/2023   Procedure: ATRIAL FIBRILLATION ABLATION;  Surgeon: Maurice Small, MD;  Location: [facility];  Service: Cardiovascular;  Laterality: N/A;   CARDIOVERSION N/A 09/12/2022   Procedure: CARDIOVERSION;  Surgeon: Meriam Sprague, MD;  Location: [facility] ENDOSCOPY;  Service: Cardiovascular;  Laterality: N/A;   CARDIOVERSION N/A 02/12/2023   Procedure: CARDIOVERSION;  Surgeon: Meriam Sprague, MD;  Location: [facility] ENDOSCOPY;  Service: Cardiovascular;   Laterality: N/A;   CATARACT EXTRACTION     LUMBAR DISC SURGERY  11/19/2005   NASAL SINUS SURGERY  11/19/2006   TRANSFORAMINAL LUMBAR INTERBODY FUSION W/ MIS 1 LEVEL Left 12/26/2021   Procedure: Minimally Invasive Transforaminal Lumbar Interbody Fusion, Left Lumbar [id number];  Surgeon: Bedelia Person, MD;  Location: [facility] OR;  Service: Neurosurgery;  Laterality: Left;  Minimally Invasive Transforaminal Lumbar Interbody Fusion, Left Lumbar [id number]    reports that he quit smoking about 10 years ago. His smoking use included cigarettes. He has never used smokeless tobacco. He reports current alcohol use. He reports that he does not use drugs. family history is not on file. Allergies  Allergen Reactions   Shellfish Allergy Itching    Crab, lobsters and shrimp.    Current Outpatient Medications on File Prior to Visit  Medication Sig Dispense Refill   acetaminophen (TYLENOL) 500 MG tablet Take 500-1,000 mg by mouth as needed (pain.).     apixaban (ELIQUIS) 5 MG TABS tablet Take 1 tablet (5 mg total) by mouth 2 (two) times daily. 60 tablet 3   EPINEPHrine (EPIPEN) 0.3 mg/0.3 mL DEVI Inject 0.3 mLs (0.3 mg total) into the muscle once. 1 Device 0   fluticasone (FLONASE) 50 MCG/ACT nasal spray Place 2 sprays into both nostrils daily. (Patient taking differently: Place 2 sprays into both nostrils daily as needed for allergies.) 16 g 6   levocetirizine (XYZAL) 5 MG tablet  Take 1 tablet (5 mg total) by mouth daily as needed for allergies. 30 tablet 11   Polyethyl Glycol-Propyl Glycol (SYSTANE) 0.4-0.3 % SOLN Place 1-2 drops into both eyes 3 (three) times daily as needed (dry/irritated eyes.).     triamcinolone cream (KENALOG) 0.5 % Apply 1 Application topically 3 (three) times daily. (Patient taking differently: Apply 1 Application topically 3 (three) times daily as needed (skin irritation).) 45 g 1   Trolamine Salicylate (BLUE-EMU MAXIMUM PAIN RELIEF EX) Apply 1 Application topically 3 (three) times  daily as needed (pain.).     No current facility-administered medications on file prior to visit.        ROS:  All others reviewed and negative.  Objective        PE:  BP 122/74 (BP Location: Right Arm, Patient Position: Sitting, Cuff Size: Normal)   Pulse 70   Temp 98.6 F (37 C) (Oral)   Ht 5\' 6"  (1.676 m)   Wt 255 lb (115.7 kg)   SpO2 95%   BMI 41.16 kg/m                 Constitutional: Pt appears in NAD               HENT: Head: NCAT.                Right Ear: External ear normal.                 Left Ear: External ear normal. Bilat tm's with mild erythema.  Max sinus areas mild tender.  Pharynx with mild erythema, no exudate                 Eyes: . Pupils are equal, round, and reactive to light. Conjunctivae and EOM are normal               Nose: without d/c or deformity               Neck: Neck supple. Gross normal ROM               Cardiovascular: Normal rate and regular rhythm.                 Pulmonary/Chest: Effort normal and breath sounds without rales or wheezing.                               Neurological: Pt is alert. At baseline orientation, motor grossly intact               Skin: Skin is warm. No rashes, no other new lesions, LE edema - none               Psychiatric: Pt behavior is normal without agitation   Micro: none  Cardiac tracings I have personally interpreted today:  none  Pertinent Radiological findings (summarize): none   Lab Results  Component Value Date   WBC 8.3 09/12/2023   HGB 16.2 09/12/2023   HCT 51.6 (H) 09/12/2023   PLT 245 09/12/2023   GLUCOSE 86 09/12/2023   CHOL 195 05/05/2021   TRIG 154.0 (H) 05/05/2021   HDL 47.60 05/05/2021   LDLCALC 117 (H) 05/05/2021   ALT 25 09/25/2022   AST 23 09/25/2022   NA 142 09/12/2023   K 4.4 09/12/2023   CL 102 09/12/2023   CREATININE 1.05 09/12/2023   BUN 18 09/12/2023   CO2 23 09/12/2023  TSH 1.30 05/05/2021   PSA 0.54 01/11/2022   HGBA1C 5.5 01/01/2017   Assessment/Plan:  Joe Hawkins is a 62 y.o. Asian [4] male with  has a past medical history of A-fib (HCC), Allergic rhinitis, GERD (gastroesophageal reflux disease), Low back pain, Sinusitis, and Tinnitus (11/19/2009).  Acute upper respiratory infection Mild to mod, for antibx course zpack,  to f/u any worsening symptoms or concerns  Allergic rhinitis Mild to mod, for restart flonase asd,  to f/u any worsening symptoms or concerns  GERD Stable, continue tums prn  Followup: Return if symptoms worsen or fail to improve.  Oliver Barre, MD 11/16/2023 5:54 PM [facility]

## 2023-11-16 ENCOUNTER — Encounter: Payer: Self-pay | Admitting: Internal Medicine

## 2023-11-16 NOTE — Assessment & Plan Note (Signed)
Stable, continue tums prn

## 2023-11-16 NOTE — Assessment & Plan Note (Signed)
Mild to mod, for restart flonase asd, to f/u any worsening symptoms or concerns

## 2023-11-16 NOTE — Assessment & Plan Note (Signed)
Mild to mod, for antibx course zpack,  to f/u any worsening symptoms or concerns 

## 2023-11-22 DIAGNOSIS — R3121 Asymptomatic microscopic hematuria: Secondary | ICD-10-CM | POA: Diagnosis not present

## 2023-11-22 DIAGNOSIS — R3129 Other microscopic hematuria: Secondary | ICD-10-CM | POA: Diagnosis not present

## 2023-11-22 DIAGNOSIS — K760 Fatty (change of) liver, not elsewhere classified: Secondary | ICD-10-CM | POA: Diagnosis not present

## 2023-11-25 ENCOUNTER — Telehealth: Payer: Self-pay | Admitting: *Deleted

## 2023-11-25 DIAGNOSIS — G4733 Obstructive sleep apnea (adult) (pediatric): Secondary | ICD-10-CM

## 2023-11-25 DIAGNOSIS — I4819 Other persistent atrial fibrillation: Secondary | ICD-10-CM

## 2023-11-25 NOTE — Telephone Encounter (Signed)
 Per Dr Mayford Knife, Patient has evidence of nocturnal hypoxemia despite CPAP therapy.  Please start O2 at 2 L via CPAP nightly and get an ONO on O2 and CPAP   Order placed to [facility] via community message

## 2023-11-25 NOTE — Addendum Note (Signed)
 Addended by: Reesa Chew on: 11/25/2023 03:47 PM   Modules accepted: Orders

## 2023-12-02 ENCOUNTER — Other Ambulatory Visit: Payer: Self-pay | Admitting: Cardiovascular Disease

## 2023-12-02 DIAGNOSIS — I4891 Unspecified atrial fibrillation: Secondary | ICD-10-CM

## 2023-12-02 NOTE — Telephone Encounter (Signed)
 Prescription refill request for Eliquis received. Indication:afib Last office visit:12/24 Scr:1.05  10/24 Age: 63 Weight:115.7  kg  Prescription refilled

## 2023-12-11 DIAGNOSIS — G4733 Obstructive sleep apnea (adult) (pediatric): Secondary | ICD-10-CM | POA: Diagnosis not present

## 2023-12-14 IMAGING — CT CT ABD-PELV W/ CM
2 of 5 series · 15 of 46 positions shown, 17 images · IV contrast (OMNIPAQUE 300)
Comparison: None Available.

CLINICAL DATA: 61-year-old male with abdominal and pelvic pain for
3 months.

EXAM:
CT ABDOMEN AND PELVIS WITH CONTRAST
TECHNIQUE: Multidetector CT imaging of the abdomen and pelvis was performed
using the standard protocol following bolus administration of
intravenous contrast.

[Series 2: abd/pel w · axial · 0.82mm/px · z∈[-520,-90]mm · 12 of 98 slices shown, 14 images]
[im 6/98  soft-tissue]
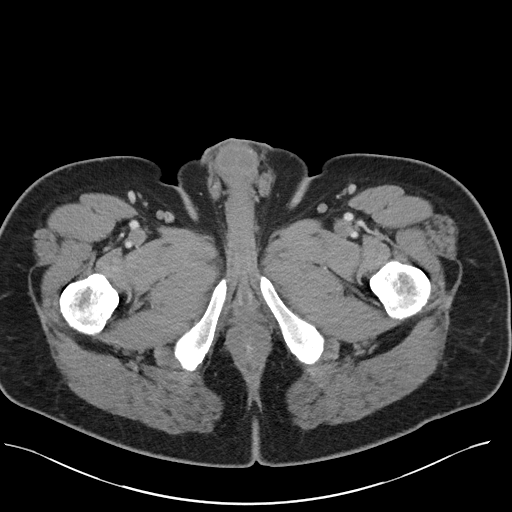
[im 6/98  bone]
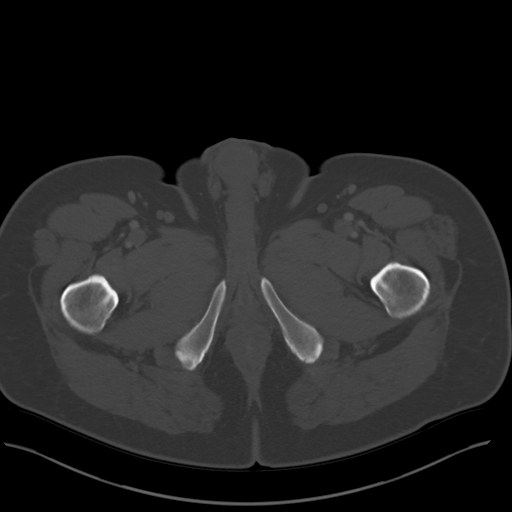
[im 17/98  soft-tissue]
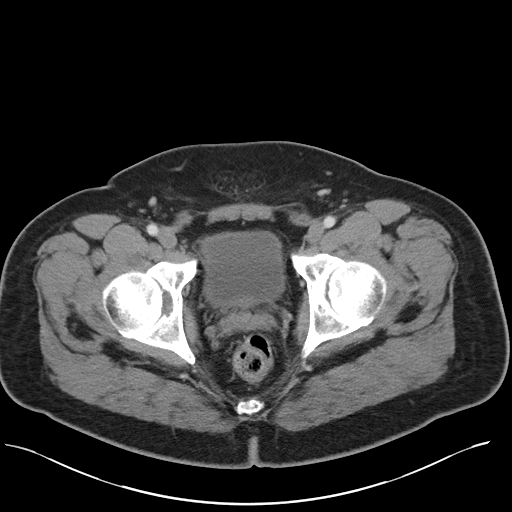
[im 22/98  soft-tissue]
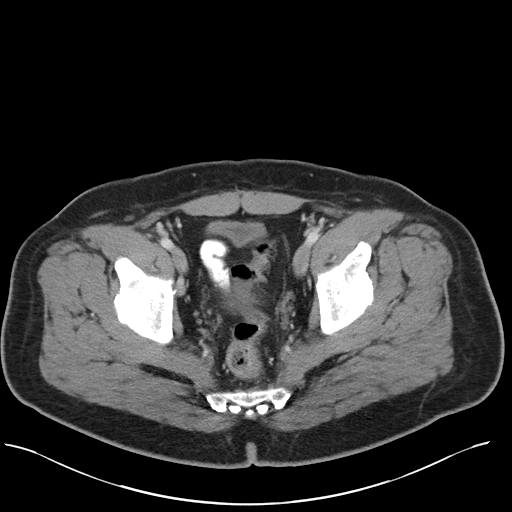
[im 27/98  soft-tissue]
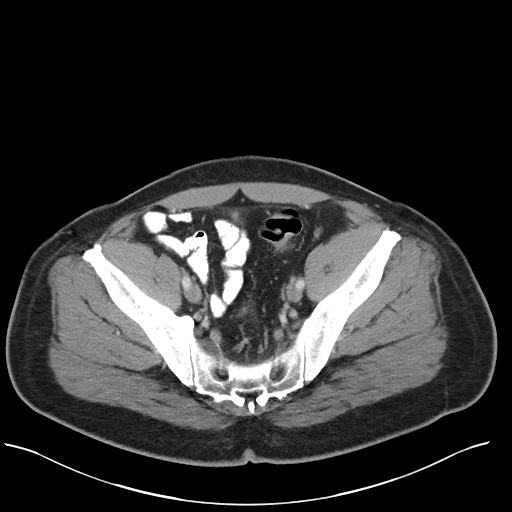
[im 38/98  soft-tissue]
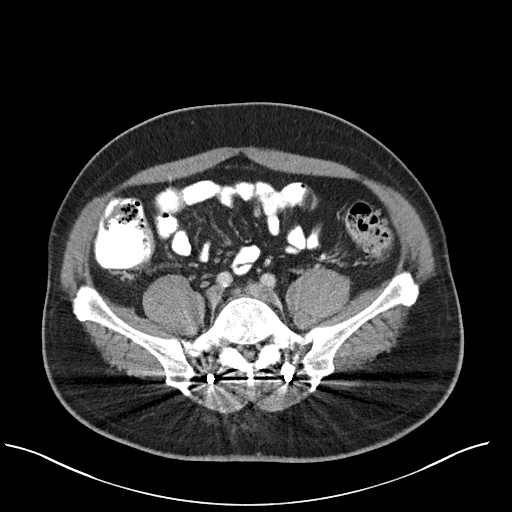
[im 44/98  soft-tissue]
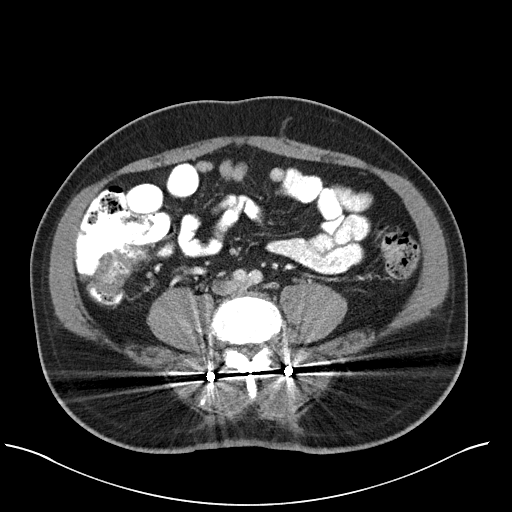
[im 54/98  soft-tissue]
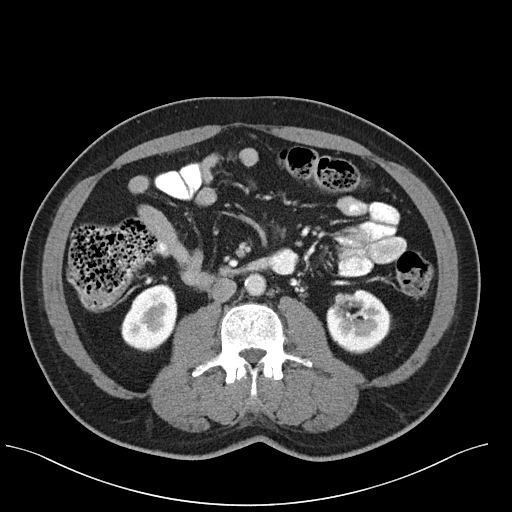
[im 60/98  soft-tissue]
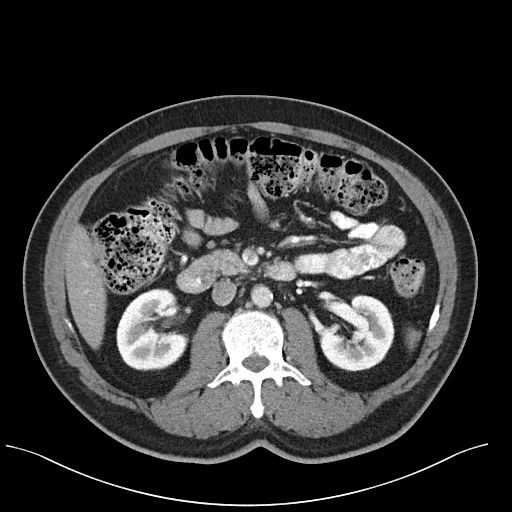
[im 71/98  soft-tissue]
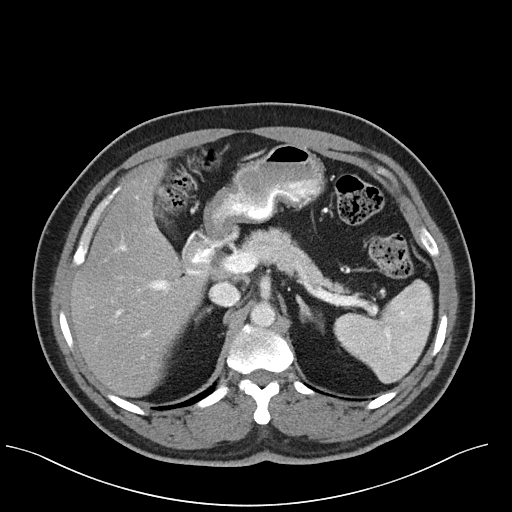
[im 71/98  bone]
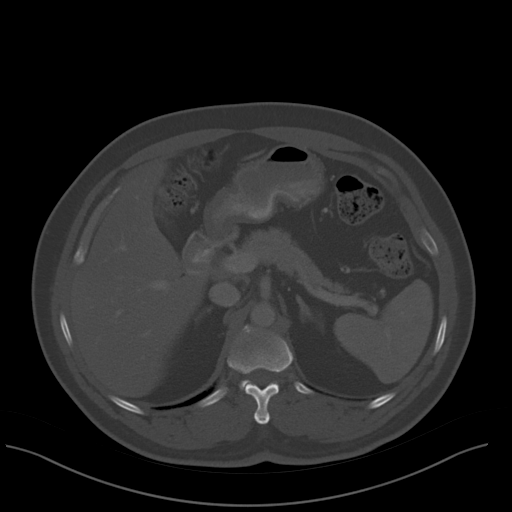
[im 76/98  soft-tissue]
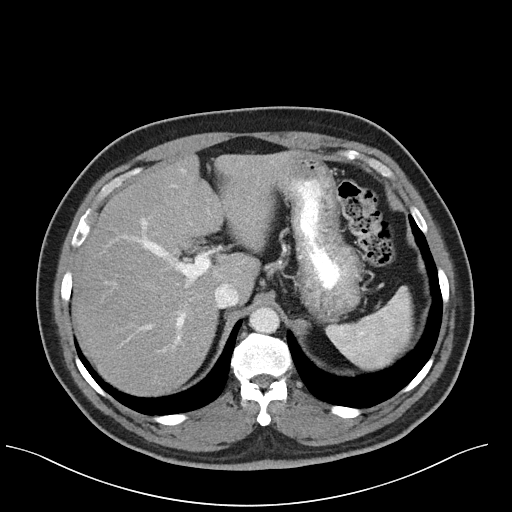
[im 81/98  soft-tissue]
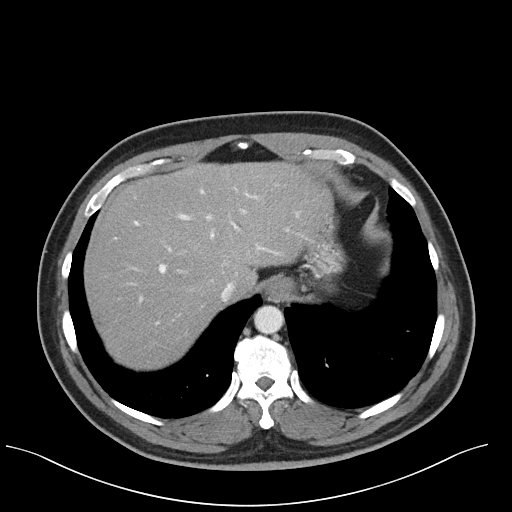
[im 92/98  soft-tissue]
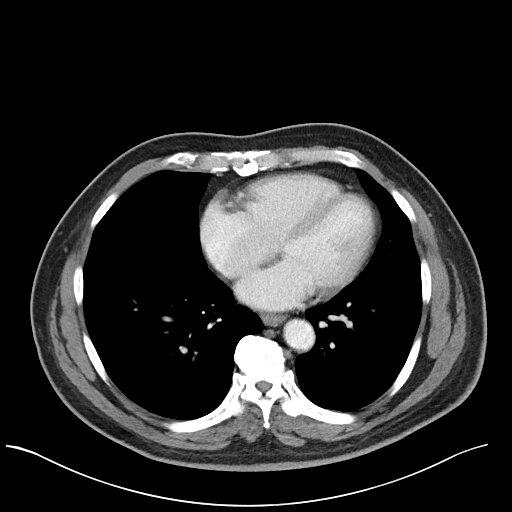

[Series 5: coronal st · coronal · 0.72mm/px · 3 of 93 slices shown]
[im 31/93  soft-tissue]
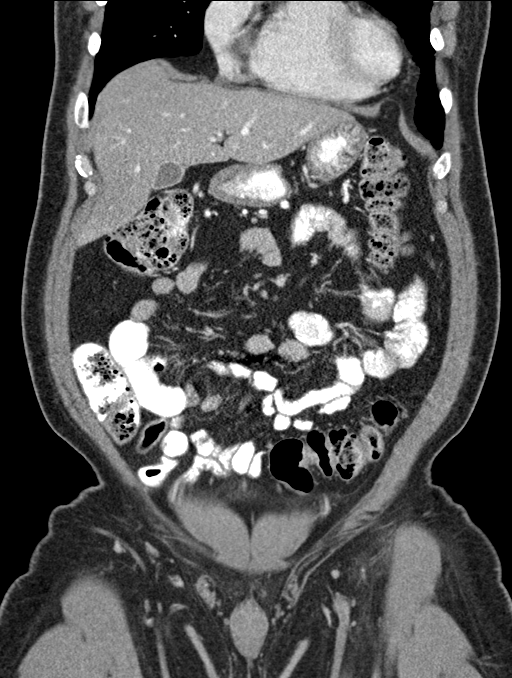
[im 41/93  soft-tissue]
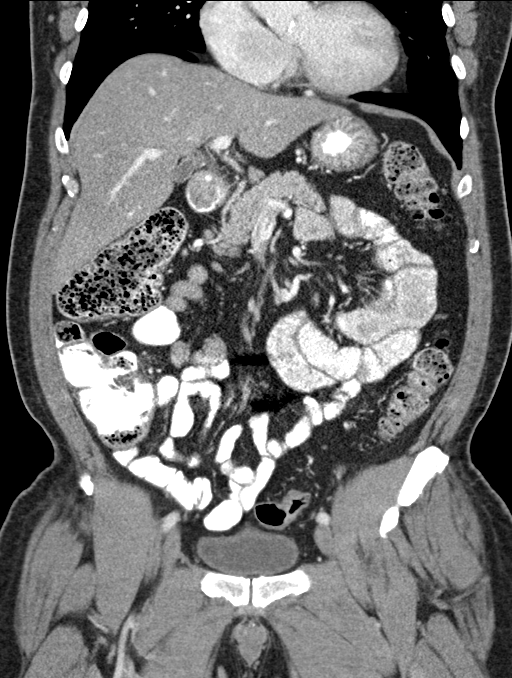
[im 52/93  soft-tissue]
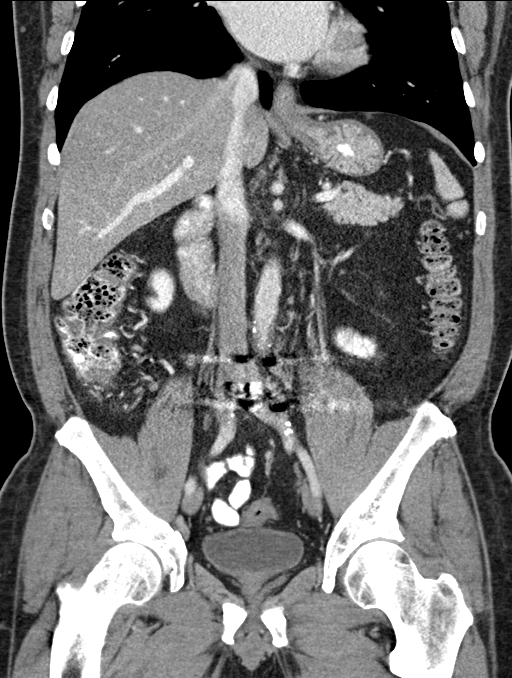

[15 of 46 positions shown; findings below may reference images not displayed]

RADIATION DOSE REDUCTION: This exam was performed according to the
departmental dose-optimization program which includes automated
exposure control, adjustment of the mA and/or kV according to
patient size and/or use of iterative reconstruction technique.

CONTRAST:  100mL OMNIPAQUE IOHEXOL 300 MG/ML  SOLN
FINDINGS: Lower chest: No acute or suspicious abnormalities noted. Calcified
RIGHT hilar lymph node and tiny calcified RIGHT LOWER lobe granuloma
noted.

Hepatobiliary: Hepatic steatosis noted. No suspicious focal hepatic
abnormalities are present. A 4 mm RIGHT hepatic cyst is identified.
The gallbladder is unremarkable. There is no evidence of
intrahepatic or extrahepatic biliary dilatation.

Pancreas: Unremarkable

Spleen: Unremarkable

Adrenals/Urinary Tract: The kidneys, adrenal glands and bladder are
unremarkable except for LEFT LOWER pole renal scarring.

Stomach/Bowel: There is apparent circumferential wall thickening of
the distal ileum/proximal colon at the ileocecal valve, more than
expected with a contraction and there is no evidence of adjacent
inflammation. There is no evidence of bowel obstruction. No other
definite areas of bowel wall thickening noted. A moderate to large
amount of stool within the ascending and transverse colon
identified.

The appendix is normal.

Vascular/Lymphatic: There are a few prominent RIGHT
mesenteric/pericolonic lymph nodes (series 2: Images 53-57). Aortic
atherosclerosis. No enlarged abdominal or pelvic lymph nodes.

Reproductive: No significant abnormality

Other: No ascites, focal collection or pneumoperitoneum identified.

Musculoskeletal: No acute or suspicious bony abnormalities are
identified. Posterior/interbody fusion changes at L4-5 noted.
IMPRESSION: 1. Apparent circumferential wall thickening of the distal
ileum/proximal colon at the ileocecal valve with a few prominent
RIGHT mesenteric/pericolonic lymph nodes. This is nonspecific but
suspicious for malignancy with possible adjacent lymph node spread.
No adjacent inflammation identified. Recommend GI
consultation/direct inspection.
2. Hepatic steatosis.
3. Aortic Atherosclerosis ([id number]-LE1.1).

## 2023-12-23 DIAGNOSIS — R3121 Asymptomatic microscopic hematuria: Secondary | ICD-10-CM | POA: Diagnosis not present

## 2024-01-09 ENCOUNTER — Other Ambulatory Visit: Payer: Self-pay | Admitting: Internal Medicine

## 2024-01-09 ENCOUNTER — Encounter: Payer: Self-pay | Admitting: Internal Medicine

## 2024-01-09 ENCOUNTER — Ambulatory Visit (INDEPENDENT_AMBULATORY_CARE_PROVIDER_SITE_OTHER): Payer: 59 | Admitting: Internal Medicine

## 2024-01-09 ENCOUNTER — Ambulatory Visit: Payer: 59 | Admitting: Emergency Medicine

## 2024-01-09 VITALS — BP 134/82 | HR 80 | Temp 97.8°F | Ht 66.0 in | Wt 260.0 lb

## 2024-01-09 DIAGNOSIS — E669 Obesity, unspecified: Secondary | ICD-10-CM | POA: Diagnosis not present

## 2024-01-09 DIAGNOSIS — Z6841 Body Mass Index (BMI) 40.0 and over, adult: Secondary | ICD-10-CM | POA: Diagnosis not present

## 2024-01-09 DIAGNOSIS — J320 Chronic maxillary sinusitis: Secondary | ICD-10-CM

## 2024-01-09 MED ORDER — WEGOVY 0.25 MG/0.5ML ~~LOC~~ SOAJ: 0.2500 mg | SUBCUTANEOUS | 2 refills | Status: DC

## 2024-01-09 MED ORDER — CEFDINIR 300 MG PO CAPS: 300.0000 mg | ORAL_CAPSULE | Freq: Two times a day (BID) | ORAL | 1 refills | Status: DC

## 2024-01-09 NOTE — Progress Notes (Signed)
 Subjective:  Patient ID: Joe Hawkins, male    DOB: 1961-10-23  Age: 63 y.o. MRN: [id number]  CC: Headache and Nasal Congestion (Runny nose)   HPI Joe Hawkins presents for sinusitis C/o back itching C/o obesity  Outpatient Medications Prior to Visit  Medication Sig Dispense Refill   acetaminophen (TYLENOL) 500 MG tablet Take 500-1,000 mg by mouth as needed (pain.).     EPINEPHrine (EPIPEN) 0.3 mg/0.3 mL DEVI Inject 0.3 mLs (0.3 mg total) into the muscle once. 1 Device 0   fluticasone (FLONASE) 50 MCG/ACT nasal spray Place 2 sprays into both nostrils daily. (Patient taking differently: Place 2 sprays into both nostrils daily as needed for allergies.) 16 g 6   levocetirizine (XYZAL) 5 MG tablet Take 1 tablet (5 mg total) by mouth daily as needed for allergies. 30 tablet 11   Polyethyl Glycol-Propyl Glycol (SYSTANE) 0.4-0.3 % SOLN Place 1-2 drops into both eyes 3 (three) times daily as needed (dry/irritated eyes.).     triamcinolone cream (KENALOG) 0.5 % Apply 1 Application topically 3 (three) times daily. (Patient taking differently: Apply 1 Application topically 3 (three) times daily as needed (skin irritation).) 45 g 1   Trolamine Salicylate (BLUE-EMU MAXIMUM PAIN RELIEF EX) Apply 1 Application topically 3 (three) times daily as needed (pain.).     ELIQUIS 5 MG TABS tablet TAKE 1 TABLET BY MOUTH TWICE A DAY 60 tablet 3   No facility-administered medications prior to visit.    ROS: Review of Systems  Constitutional:  Positive for fatigue. Negative for appetite change and unexpected weight change.  HENT:  Positive for congestion. Negative for nosebleeds, sneezing, sore throat and trouble swallowing.   Eyes:  Negative for itching and visual disturbance.  Respiratory:  Negative for cough.   Cardiovascular:  Negative for chest pain, palpitations and leg swelling.  Gastrointestinal:  Negative for abdominal distention, blood in stool, diarrhea and nausea.  Genitourinary:  Negative for  frequency and hematuria.  Musculoskeletal:  Positive for back pain. Negative for gait problem, joint swelling and neck pain.  Skin:  Negative for rash.  Neurological:  Negative for dizziness, tremors, speech difficulty and weakness.  Psychiatric/Behavioral:  Negative for agitation, dysphoric mood and sleep disturbance. The patient is not nervous/anxious.     Objective:  BP 134/82 (BP Location: Left Arm, Patient Position: Sitting)   Pulse 80   Temp 97.8 F (36.6 C) (Temporal)   Ht 5\' 6"  (1.676 m)   Wt 260 lb (117.9 kg)   SpO2 96%   BMI 41.97 kg/m   BP Readings from Last 3 Encounters:  01/10/24 120/84  01/09/24 134/82  11/12/23 122/74    Wt Readings from Last 3 Encounters:  01/10/24 255 lb (115.7 kg)  01/09/24 260 lb (117.9 kg)  11/12/23 255 lb (115.7 kg)    Physical Exam Constitutional:      General: He is not in acute distress.    Appearance: He is well-developed. He is obese.     Comments: NAD  Eyes:     Conjunctiva/sclera: Conjunctivae normal.     Pupils: Pupils are equal, round, and reactive to light.  Neck:     Thyroid: No thyromegaly.     Vascular: No JVD.  Cardiovascular:     Rate and Rhythm: Normal rate and regular rhythm.     Heart sounds: Normal heart sounds. No murmur heard.    No friction rub. No gallop.  Pulmonary:     Effort: Pulmonary effort is normal. No respiratory distress.  Breath sounds: Normal breath sounds. No wheezing or rales.  Chest:     Chest wall: No tenderness.  Abdominal:     General: Bowel sounds are normal. There is no distension.     Palpations: Abdomen is soft. There is no mass.     Tenderness: There is no abdominal tenderness. There is no guarding or rebound.  Musculoskeletal:        General: No tenderness. Normal range of motion.     Cervical back: Normal range of motion.  Lymphadenopathy:     Cervical: No cervical adenopathy.  Skin:    General: Skin is warm and dry.     Findings: No rash.  Neurological:     Mental  Status: He is alert and oriented to person, place, and time.     Cranial Nerves: No cranial nerve deficit.     Motor: No abnormal muscle tone.     Coordination: Coordination normal.     Gait: Gait normal.     Deep Tendon Reflexes: Reflexes are normal and symmetric.  Psychiatric:        Behavior: Behavior normal.        Thought Content: Thought content normal.        Judgment: Judgment normal.     Lab Results  Component Value Date   WBC 8.3 09/12/2023   HGB 16.2 09/12/2023   HCT 51.6 (H) 09/12/2023   PLT 245 09/12/2023   GLUCOSE 86 09/12/2023   CHOL 195 05/05/2021   TRIG 154.0 (H) 05/05/2021   HDL 47.60 05/05/2021   LDLCALC 117 (H) 05/05/2021   ALT 25 09/25/2022   AST 23 09/25/2022   NA 142 09/12/2023   K 4.4 09/12/2023   CL 102 09/12/2023   CREATININE 1.05 09/12/2023   BUN 18 09/12/2023   CO2 23 09/12/2023   TSH 1.30 05/05/2021   PSA 0.54 01/11/2022   HGBA1C 5.5 01/01/2017    No results found.  Assessment & Plan:   Problem List Items Addressed This Visit     Sinusitis, chronic   New episode Start cefdinir      Relevant Medications   cefdinir (OMNICEF) 300 MG capsule   Morbid obesity with BMI of 45.0-49.9, adult (HCC) - Primary   Options discussed Start Wegovy      Obesity (BMI 30-39.9)   BMI 41.  OZHYQM prescribed         Meds ordered this encounter  Medications   cefdinir (OMNICEF) 300 MG capsule    Sig: Take 1 capsule (300 mg total) by mouth 2 (two) times daily.    Dispense:  20 capsule    Refill:  1   DISCONTD: Semaglutide-Weight Management (WEGOVY) 0.25 MG/0.5ML SOAJ    Sig: Inject 0.25 mg into the skin once a week.    Dispense:  2 mL    Refill:  2    BMI 41      Follow-up: Return in about 3 months (around 04/07/2024) for a follow-up visit.  Sonda Primes, MD

## 2024-01-09 NOTE — Patient Instructions (Signed)
 Marland Kitchen

## 2024-01-09 NOTE — Assessment & Plan Note (Signed)
Options discussed Start [facility]

## 2024-01-10 ENCOUNTER — Ambulatory Visit: Payer: Medicare Other | Attending: Cardiovascular Disease | Admitting: Cardiovascular Disease

## 2024-01-10 ENCOUNTER — Encounter: Payer: Self-pay | Admitting: Cardiovascular Disease

## 2024-01-10 VITALS — BP 120/84 | HR 90 | Ht 66.0 in | Wt 255.0 lb

## 2024-01-10 DIAGNOSIS — I4819 Other persistent atrial fibrillation: Secondary | ICD-10-CM | POA: Diagnosis not present

## 2024-01-10 NOTE — Patient Instructions (Signed)
Medication Instructions:  STOP Eliquis *If you need a refill on your cardiac medications before your next appointment, please call your pharmacy*   Follow-Up: At [facility], you and your health needs are our priority.  As part of our continuing mission to provide you with exceptional heart care, we have created designated Provider Care Teams.  These Care Teams include your primary Cardiologist (physician) and Advanced Practice Providers (APPs -  Physician Assistants and Nurse Practitioners) who all work together to provide you with the care you need, when you need it.  We recommend signing up for the patient portal called "MyChart".  Sign up information is provided on this After Visit Summary.  MyChart is used to connect with patients for Virtual Visits (Telemedicine).  Patients are able to view lab/test results, encounter notes, upcoming appointments, etc.  Non-urgent messages can be sent to your provider as well.   To learn more about what you can do with MyChart, go to [email or web address].    Your next appointment:   6 month(s)  Provider:   York Pellant, MD

## 2024-01-10 NOTE — Progress Notes (Signed)
  Electrophysiology Office Note:    Date:  01/10/2024   ID:  Joe Hawkins, DOB 30-Aug-1961, MRN [id number]  PCP:  Tresa Garter, MD   [facility] Providers Cardiologist:  None Electrophysiologist:  Maurice Small, MD     Referring MD: Tresa Garter, MD   History of Present Illness:    Joe Hawkins is a 63 y.o. male with a hx significant for atrial fibrillation, referred for arrhythmia management.     He was recently diagnosed with atrial fibrillation detected incidentally at the time of a colonoscopy in August 2023. I reviewed several office visit notes from July and prior months, all of which documented normal rate and rhythm. He was given metoprolol and eliquis. In follow-up, he was hypotensive, and HR was 61 bpm, so betablocker was held. He underwent DC cardioversion with ERAF.   In the past he has noticed his heart "feels like there is something crawling on it..feel like there is a worm moving on it."   He was referred for a sleep evaluation after complaining of daytime somnolence. He was diagnosed with severe OSA.  He underwent ablation for persistent atrial fibrillation in November 2024.  He was found to have reconnection of the right superior pulmonary vein and right carina.  In addition to reablation of the right pulmonary veins, the posterior wall was also ablated.      He reports that he is felt very good since his ablation.  He can tell a difference now that he remains in sinus rhythm.   EKGs/Labs/Other Studies Reviewed Today:    TTE 07/26/2022 EF 55-60%, mildly dilated LA  EKG:  Last EKG results: today - AF with right superior axis, Q waves anterior and inferior leads   Recent Labs: 09/12/2023: BUN 18; Creatinine, Ser 1.05; Hemoglobin 16.2; Platelets 245; Potassium 4.4; Sodium 142     Physical Exam:    VS:  BP 120/84 (BP Location: Left Arm, Patient Position: Sitting, Cuff Size: Large)   Pulse 90   Ht 5\' 6"  (1.676 m)   Wt 255 lb (115.7 kg)    SpO2 93%   BMI 41.16 kg/m     Wt Readings from Last 3 Encounters:  01/10/24 255 lb (115.7 kg)  01/09/24 260 lb (117.9 kg)  11/12/23 255 lb (115.7 kg)     GEN:  Well nourished, well developed in no acute distress CARDIAC: Irregular rhythm, no murmurs, rubs, gallops RESPIRATORY:  Normal work of breathing MUSCULOSKELETAL: no edema    ASSESSMENT & PLAN:    Atrial fibrillation:  persistent, diagnosed incidentally.  Symptoms include palpitations.  S/p ablation 12/31/2022 with early recurrence. He had a cardioversion 3/26 but returned to clinic in atrial fibrillation.  Maintaining sinus rhythm status post ablation September 12, 2023   Secondary hypercoagulable state:  CHADS2Vasc is 0  Will discontinue eliquis  Obstructive sleep apnea Compliance with CPAP was encouraged.        Medication Adjustments/Labs and Tests Ordered: Current medicines are reviewed at length with the patient today.  Concerns regarding medicines are outlined above.  Orders Placed This Encounter  Procedures   EKG 12-Lead   No orders of the defined types were placed in this encounter.    Signed, Maurice Small, MD  01/10/2024 3:55 PM    [facility]

## 2024-01-11 DIAGNOSIS — G4733 Obstructive sleep apnea (adult) (pediatric): Secondary | ICD-10-CM | POA: Diagnosis not present

## 2024-01-27 DIAGNOSIS — E669 Obesity, unspecified: Secondary | ICD-10-CM | POA: Insufficient documentation

## 2024-01-27 NOTE — Assessment & Plan Note (Signed)
 New episode Start cefdinir

## 2024-01-27 NOTE — Assessment & Plan Note (Signed)
 BMI 41.  [facility] prescribed

## 2024-06-30 ENCOUNTER — Encounter: Payer: Self-pay | Admitting: Internal Medicine

## 2024-06-30 ENCOUNTER — Ambulatory Visit (INDEPENDENT_AMBULATORY_CARE_PROVIDER_SITE_OTHER): Admitting: Internal Medicine

## 2024-06-30 VITALS — BP 129/83 | HR 77 | Temp 98.1°F | Ht 66.0 in | Wt 260.0 lb

## 2024-06-30 DIAGNOSIS — R635 Abnormal weight gain: Secondary | ICD-10-CM

## 2024-06-30 DIAGNOSIS — R1084 Generalized abdominal pain: Secondary | ICD-10-CM

## 2024-06-30 DIAGNOSIS — M792 Neuralgia and neuritis, unspecified: Secondary | ICD-10-CM

## 2024-06-30 DIAGNOSIS — E669 Obesity, unspecified: Secondary | ICD-10-CM

## 2024-06-30 DIAGNOSIS — R739 Hyperglycemia, unspecified: Secondary | ICD-10-CM

## 2024-06-30 DIAGNOSIS — R109 Unspecified abdominal pain: Secondary | ICD-10-CM | POA: Insufficient documentation

## 2024-06-30 MED ORDER — LEVOCETIRIZINE DIHYDROCHLORIDE 5 MG PO TABS: 5.0000 mg | ORAL_TABLET | Freq: Every day | ORAL | 3 refills | Status: AC | PRN

## 2024-06-30 MED ORDER — FLUTICASONE PROPIONATE 50 MCG/ACT NA SUSP: 2.0000 | Freq: Every day | NASAL | 3 refills | Status: AC

## 2024-06-30 MED ORDER — MELOXICAM 15 MG PO TABS: 15.0000 mg | ORAL_TABLET | Freq: Every day | ORAL | 1 refills | Status: DC

## 2024-06-30 MED ORDER — PHENTERMINE HCL 37.5 MG PO TABS
37.5000 mg | ORAL_TABLET | Freq: Every day | ORAL | 2 refills | Status: DC
Start: 2024-06-30 — End: 2024-09-01

## 2024-06-30 NOTE — Assessment & Plan Note (Signed)
 Radiculopathic pain in T11-T12 distribution on the left.  The pain is worse at night. The pain is much worse at night.  She has a very hard time getting out of bed in the morning.  It sounds like the pain starts in his and goes around his left flank to the front of his stomach in the direction of the pubis and uvula close.  The pain with cross the midline.  There is no rash.  The pain is similar to the pain he had prior to his back surgery.  The patient saw his back surgeon Dr. Sebastian recently and was told that everything is healed well. S/p posterior/interbody fusion changes at L4-5. Elastic waist pants Elastic stretchable belt, pants Blue-Emu cream --use 2-3 times a day: use on the painful area MRI thoracic spine if not better

## 2024-06-30 NOTE — Assessment & Plan Note (Addendum)
 Will start phentermine  po -risks and benefits discussed Labs ordered

## 2024-06-30 NOTE — Progress Notes (Signed)
 Subjective:  Patient ID: Joe Hawkins, male    DOB: 12-02-1960  Age: 63 y.o. MRN: [id number]  CC: Abdominal Pain (Pt has stated he has been having abdominal pain since his recent back surgery... Pt states the pain is across the mid abdomen area and is making it hard to sleep and sit)   HPI [name] presents for Abdominal Pain (Pt has stated he has been having superficial abdominal pain since his recent back surgery... Pt states the pain is across the mid abdomen area and is making it hard to sleep and sit).  Pain is worse when he is moving, turning in bed, touching the stomach Pain in the abdomen x 3 years C/o wt a lot of wt gain which made his abdominal pain worse He is here with an interpreter   Outpatient Medications Prior to Visit  Medication Sig Dispense Refill   acetaminophen  (TYLENOL ) 500 MG tablet Take 500-1,000 mg by mouth as needed (pain.).     cefdinir  (OMNICEF ) 300 MG capsule Take 1 capsule (300 mg total) by mouth 2 (two) times daily. 20 capsule 1   EPINEPHrine  (EPIPEN ) 0.3 mg/0.3 mL DEVI Inject 0.3 mLs (0.3 mg total) into the muscle once. 1 Device 0   mometasone (ELOCON) 0.1 % cream Apply topically.     Polyethyl Glycol-Propyl Glycol (SYSTANE) 0.4-0.3 % SOLN Place 1-2 drops into both eyes 3 (three) times daily as needed (dry/irritated eyes.).     Trolamine Salicylate (BLUE-EMU MAXIMUM PAIN RELIEF EX) Apply 1 Application topically 3 (three) times daily as needed (pain.).     fluticasone  (FLONASE ) 50 MCG/ACT nasal spray Place 2 sprays into both nostrils daily. (Patient taking differently: Place 2 sprays into both nostrils daily as needed for allergies.) 16 g 6   levocetirizine (XYZAL ) 5 MG tablet Take 1 tablet (5 mg total) by mouth daily as needed for allergies. 30 tablet 11   Semaglutide -Weight Management (WEGOVY ) 0.25 MG/0.5ML SOAJ INJECT 0.25MG  INTO THE SKIN ONE TIME PER WEEK 2 mL 2   No facility-administered medications prior to visit.    ROS: Review of Systems   Constitutional:  Positive for unexpected weight change. Negative for appetite change and fatigue.  HENT:  Negative for congestion, nosebleeds, sneezing, sore throat and trouble swallowing.   Eyes:  Negative for itching and visual disturbance.  Respiratory:  Negative for cough.   Cardiovascular:  Negative for chest pain, palpitations and leg swelling.  Gastrointestinal:  Positive for abdominal distention and abdominal pain. Negative for blood in stool, diarrhea and nausea.  Genitourinary:  Negative for frequency and hematuria.  Musculoskeletal:  Negative for back pain, gait problem, joint swelling and neck pain.  Skin:  Negative for rash.  Neurological:  Negative for dizziness, tremors, speech difficulty and weakness.  Psychiatric/Behavioral:  Negative for agitation, dysphoric mood and sleep disturbance. The patient is not nervous/anxious.     Objective:  BP 129/83   Pulse 77   Temp 98.1 F (36.7 C) (Oral)   Ht 5' 6 (1.676 m)   Wt 260 lb (117.9 kg)   SpO2 95%   BMI 41.97 kg/m   BP Readings from Last 3 Encounters:  06/30/24 129/83  01/10/24 120/84  01/09/24 134/82    Wt Readings from Last 3 Encounters:  06/30/24 260 lb (117.9 kg)  01/10/24 255 lb (115.7 kg)  01/09/24 260 lb (117.9 kg)    Physical Exam Constitutional:      General: He is not in acute distress.    Appearance: He is well-developed.  Comments: NAD  Eyes:     Conjunctiva/sclera: Conjunctivae normal.     Pupils: Pupils are equal, round, and reactive to light.  Neck:     Thyroid : No thyromegaly.     Vascular: No JVD.  Cardiovascular:     Rate and Rhythm: Normal rate and regular rhythm.     Heart sounds: Normal heart sounds. No murmur heard.    No friction rub. No gallop.  Pulmonary:     Effort: Pulmonary effort is normal. No respiratory distress.     Breath sounds: Normal breath sounds. No wheezing or rales.  Chest:     Chest wall: No tenderness.  Abdominal:     General: Bowel sounds are normal.  There is no distension.     Palpations: Abdomen is soft. There is no mass.     Tenderness: There is generalized abdominal tenderness. There is no guarding or rebound.  Musculoskeletal:        General: No tenderness. Normal range of motion.     Cervical back: Normal range of motion.  Lymphadenopathy:     Cervical: No cervical adenopathy.  Skin:    General: Skin is warm and dry.     Findings: No rash.  Neurological:     Mental Status: He is alert and oriented to person, place, and time.     Cranial Nerves: No cranial nerve deficit.     Motor: No abnormal muscle tone.     Coordination: Coordination normal.     Gait: Gait normal.     Deep Tendon Reflexes: Reflexes are normal and symmetric.  Psychiatric:        Behavior: Behavior normal.        Thought Content: Thought content normal.        Judgment: Judgment normal.   Abdomen is large and protuberant.  It is tender over superficial palpation across the girth of the abdomen and a 20 cm band from left to right.  It feels that his subcutaneous fat palpation is painful.  No mass.  No hernias.  No exaggerated pain over abdomen with deep palpation  Lab Results  Component Value Date   WBC 8.8 07/01/2024   HGB 14.9 07/01/2024   HCT 45.6 07/01/2024   PLT 221.0 07/01/2024   GLUCOSE 128 (H) 07/01/2024   CHOL 195 05/05/2021   TRIG 154.0 (H) 05/05/2021   HDL 47.60 05/05/2021   LDLCALC 117 (H) 05/05/2021   ALT 37 07/01/2024   AST 39 (H) 07/01/2024   NA 138 07/01/2024   K 4.0 07/01/2024   CL 102 07/01/2024   CREATININE 0.96 07/01/2024   BUN 20 07/01/2024   CO2 27 07/01/2024   TSH 1.26 07/01/2024   PSA 0.54 01/11/2022   HGBA1C 6.5 07/01/2024    No results found.  Assessment & Plan:   Problem List Items Addressed This Visit     Abdominal pain - Primary   Pt has stated he has been having superficial abdominal pain since his recent back surgery... Pt states the pain is across the mid abdomen area and is making it hard to sleep and  sit).  Pain is worse when he is moving, turning in bed, touching the stomach Pain in the abdomen x 3 years C/o wt a lot of wt gain which made his abdominal pain worse  Abdomen is large and protuberant.  It is tender over superficial palpation across the girth of the abdomen and a 20 cm band from left to right.  It feels that  his subcutaneous fat palpation is painful.  No mass.  No hernias.  No exaggerated pain over abdomen with deep palpation  Differential diagnosis is broad.  It is possible that she has abdominal pain related is related to panniculitis (inflammation in the fat tissue).  Obtain sed rate, CRP, other labs  Start meloxicam .  Weight loss advised.  Phentermine  prescription given.      Relevant Orders   Urinalysis   CBC with Differential/Platelet (Completed)   Comprehensive metabolic panel with GFR (Completed)   Sedimentation rate (Completed)   C-reactive protein (Completed)   Neuropathic pain   Radiculopathic pain in T11-T12 distribution on the left.  The pain is worse at night. The pain is much worse at night.  She has a very hard time getting out of bed in the morning.  It sounds like the pain starts in his and goes around his left flank to the front of his stomach in the direction of the pubis and uvula close.  The pain with cross the midline.  There is no rash.  The pain is similar to the pain he had prior to his back surgery.  The patient saw his back surgeon Dr. Sebastian recently and was told that everything is healed well. S/p posterior/interbody fusion changes at L4-5. Elastic waist pants Elastic stretchable belt, pants Blue-Emu cream --use 2-3 times a day: use on the painful area MRI thoracic spine if not better      Relevant Orders   TSH (Completed)   Obesity (BMI 30-39.9)   Will start phentermine  po -risks and benefits discussed Labs ordered      Relevant Medications   phentermine  (ADIPEX-P ) 37.5 MG tablet   Other Relevant Orders   TSH (Completed)    Other Visit Diagnoses       Hyperglycemia       Relevant Orders   TSH (Completed)   Urinalysis   CBC with Differential/Platelet (Completed)   Comprehensive metabolic panel with GFR (Completed)   Hemoglobin A1c (Completed)     Weight gain       Relevant Orders   TSH (Completed)         Meds ordered this encounter  Medications   phentermine  (ADIPEX-P ) 37.5 MG tablet    Sig: Take 1 tablet (37.5 mg total) by mouth daily before breakfast.    Dispense:  30 tablet    Refill:  2   meloxicam  (MOBIC ) 15 MG tablet    Sig: Take 1 tablet (15 mg total) by mouth daily.    Dispense:  30 tablet    Refill:  1   fluticasone  (FLONASE ) 50 MCG/ACT nasal spray    Sig: Place 2 sprays into both nostrils daily.    Dispense:  48 g    Refill:  3   levocetirizine (XYZAL ) 5 MG tablet    Sig: Take 1 tablet (5 mg total) by mouth daily as needed for allergies.    Dispense:  90 tablet    Refill:  3      Follow-up: Return in about 2 months (around 08/30/2024) for a follow-up visit.  Marolyn Noel, MD

## 2024-07-01 LAB — C-REACTIVE PROTEIN: CRP: <1 mg/dL (ref 0.5–20.0)

## 2024-07-01 LAB — URINALYSIS, ROUTINE W REFLEX MICROSCOPIC
Bilirubin Urine: NEGATIVE
Ketones, ur: NEGATIVE
Leukocytes,Ua: NEGATIVE
Nitrite: NEGATIVE
Specific Gravity, Urine: 1.015 (ref 1.000–1.030)
Urine Glucose: NEGATIVE
Urobilinogen, UA: 0.2 (ref 0.0–1.0)
WBC, UA: NONE SEEN (ref 0–?)
pH: 6.5 (ref 5.0–8.0)

## 2024-07-01 LAB — CBC WITH DIFFERENTIAL/PLATELET
Basophils Absolute: 0 K/uL (ref 0.0–0.1)
Basophils Relative: 0.5 % (ref 0.0–3.0)
Eosinophils Absolute: 0.9 K/uL — ABNORMAL HIGH (ref 0.0–0.7)
Eosinophils Relative: 10.3 % — ABNORMAL HIGH (ref 0.0–5.0)
HCT: 45.6 % (ref 39.0–52.0)
Hemoglobin: 14.9 g/dL (ref 13.0–17.0)
Lymphocytes Relative: 24.3 % (ref 12.0–46.0)
Lymphs Abs: 2.1 K/uL (ref 0.7–4.0)
MCHC: 32.5 g/dL (ref 30.0–36.0)
MCV: 78.2 fl (ref 78.0–100.0)
Monocytes Absolute: 0.7 K/uL (ref 0.1–1.0)
Monocytes Relative: 8.3 % (ref 3.0–12.0)
Neutro Abs: 5 K/uL (ref 1.4–7.7)
Neutrophils Relative %: 56.6 % (ref 43.0–77.0)
Platelets: 221 K/uL (ref 150.0–400.0)
RBC: 5.84 Mil/uL — ABNORMAL HIGH (ref 4.22–5.81)
RDW: 13.9 % (ref 11.5–15.5)
WBC: 8.8 K/uL (ref 4.0–10.5)

## 2024-07-01 LAB — COMPREHENSIVE METABOLIC PANEL WITH GFR
ALT: 37 U/L (ref 0–53)
AST: 39 U/L — ABNORMAL HIGH (ref 0–37)
Albumin: 4.1 g/dL (ref 3.5–5.2)
Alkaline Phosphatase: 52 U/L (ref 39–117)
BUN: 20 mg/dL (ref 6–23)
CO2: 27 meq/L (ref 19–32)
Calcium: 9.2 mg/dL (ref 8.4–10.5)
Chloride: 102 meq/L (ref 96–112)
Creatinine, Ser: 0.96 mg/dL (ref 0.40–1.50)
GFR: 82.98 mL/min (ref >60.00)
Glucose, Bld: 128 mg/dL — ABNORMAL HIGH (ref 70–99)
Potassium: 4 meq/L (ref 3.5–5.1)
Sodium: 138 meq/L (ref 135–145)
Total Bilirubin: 0.5 mg/dL (ref 0.2–1.2)
Total Protein: 7.6 g/dL (ref 6.0–8.3)

## 2024-07-01 LAB — TSH: TSH: 1.26 u[IU]/mL (ref 0.35–5.50)

## 2024-07-01 LAB — HEMOGLOBIN A1C: Hgb A1c MFr Bld: 6.5 % (ref 4.6–6.5)

## 2024-07-01 LAB — SEDIMENTATION RATE: Sed Rate: 36 mm/h — ABNORMAL HIGH (ref 0–20)

## 2024-07-05 ENCOUNTER — Ambulatory Visit: Payer: Self-pay | Admitting: Internal Medicine

## 2024-07-13 NOTE — Assessment & Plan Note (Signed)
 Pt has stated he has been having superficial abdominal pain since his recent back surgery... Pt states the pain is across the mid abdomen area and is making it hard to sleep and sit).  Pain is worse when he is moving, turning in bed, touching the stomach Pain in the abdomen x 3 years C/o wt a lot of wt gain which made his abdominal pain worse  Abdomen is large and protuberant.  It is tender over superficial palpation across the girth of the abdomen and a 20 cm band from left to right.  It feels that his subcutaneous fat palpation is painful.  No mass.  No hernias.  No exaggerated pain over abdomen with deep palpation  Differential diagnosis is broad.  It is possible that she has abdominal pain related is related to panniculitis (inflammation in the fat tissue).  Obtain sed rate, CRP, other labs  Start meloxicam .  Weight loss advised.  Phentermine  prescription given.

## 2024-08-25 ENCOUNTER — Other Ambulatory Visit: Payer: Self-pay | Admitting: Internal Medicine

## 2024-09-01 ENCOUNTER — Encounter: Payer: Self-pay | Admitting: Internal Medicine

## 2024-09-01 ENCOUNTER — Ambulatory Visit (INDEPENDENT_AMBULATORY_CARE_PROVIDER_SITE_OTHER): Admitting: Internal Medicine

## 2024-09-01 VITALS — BP 114/72 | HR 84 | Temp 97.8°F | Ht 66.0 in | Wt 244.2 lb

## 2024-09-01 DIAGNOSIS — E669 Obesity, unspecified: Secondary | ICD-10-CM | POA: Diagnosis not present

## 2024-09-01 DIAGNOSIS — Z6839 Body mass index (BMI) 39.0-39.9, adult: Secondary | ICD-10-CM | POA: Diagnosis not present

## 2024-09-01 DIAGNOSIS — R1084 Generalized abdominal pain: Secondary | ICD-10-CM

## 2024-09-01 DIAGNOSIS — M5416 Radiculopathy, lumbar region: Secondary | ICD-10-CM

## 2024-09-01 DIAGNOSIS — J320 Chronic maxillary sinusitis: Secondary | ICD-10-CM

## 2024-09-01 DIAGNOSIS — Z23 Encounter for immunization: Secondary | ICD-10-CM

## 2024-09-01 MED ORDER — PHENTERMINE HCL 37.5 MG PO TABS: 37.5000 mg | ORAL_TABLET | Freq: Every day | ORAL | 2 refills | Status: DC

## 2024-09-01 NOTE — Assessment & Plan Note (Signed)
Cont w/wt loss

## 2024-09-01 NOTE — Assessment & Plan Note (Signed)
 Abd pain is much better after wt loss: abdominal pain is likely related to panniculitis (inflammation in the fat tissue).

## 2024-09-01 NOTE — Assessment & Plan Note (Signed)
 Doing well

## 2024-09-01 NOTE — Assessment & Plan Note (Signed)
 Pt lost 16 lbs in 2 months on Phentermine  Potential benefits of a long term amphetamines  use as well as potential risks  and complications were explained to the patient and were aknowledged. Abd pain is much better after wt loss: abdominal pain is likely related to panniculitis (inflammation in the fat tissue).

## 2024-09-01 NOTE — Progress Notes (Signed)
 Subjective:  Patient ID: Joe Hawkins, male    DOB: 06-Sep-1961  Age: 63 y.o. MRN: [id number]  CC: Medical Management of Chronic Issues (2 Month follow up)   HPI Joe Hawkins presents for obesity and abd pain, sinusitis (recurrent) Abd pain is much better after wt loss!  Outpatient Medications Prior to Visit  Medication Sig Dispense Refill   acetaminophen  (TYLENOL ) 500 MG tablet Take 500-1,000 mg by mouth as needed (pain.).     cefdinir  (OMNICEF ) 300 MG capsule Take 1 capsule (300 mg total) by mouth 2 (two) times daily. 20 capsule 1   EPINEPHrine  (EPIPEN ) 0.3 mg/0.3 mL DEVI Inject 0.3 mLs (0.3 mg total) into the muscle once. 1 Device 0   fluticasone  (FLONASE ) 50 MCG/ACT nasal spray Place 2 sprays into both nostrils daily. 48 g 3   levocetirizine (XYZAL ) 5 MG tablet Take 1 tablet (5 mg total) by mouth daily as needed for allergies. 90 tablet 3   meloxicam  (MOBIC ) 15 MG tablet TAKE 1 TABLET (15 MG TOTAL) BY MOUTH DAILY. 30 tablet 1   mometasone (ELOCON) 0.1 % cream Apply topically.     Polyethyl Glycol-Propyl Glycol (SYSTANE) 0.4-0.3 % SOLN Place 1-2 drops into both eyes 3 (three) times daily as needed (dry/irritated eyes.).     Trolamine Salicylate (BLUE-EMU MAXIMUM PAIN RELIEF EX) Apply 1 Application topically 3 (three) times daily as needed (pain.).     phentermine  (ADIPEX-P ) 37.5 MG tablet Take 1 tablet (37.5 mg total) by mouth daily before breakfast. 30 tablet 2   No facility-administered medications prior to visit.    ROS: Review of Systems  Constitutional:  Negative for appetite change, fatigue and unexpected weight change.  HENT:  Positive for postnasal drip. Negative for congestion, nosebleeds, sneezing, sore throat and trouble swallowing.   Eyes:  Negative for itching and visual disturbance.  Respiratory:  Negative for cough.   Cardiovascular:  Negative for chest pain, palpitations and leg swelling.  Gastrointestinal:  Positive for abdominal pain. Negative for abdominal  distention, blood in stool, diarrhea and nausea.  Genitourinary:  Negative for frequency and hematuria.  Musculoskeletal:  Positive for back pain. Negative for gait problem, joint swelling and neck pain.  Skin:  Negative for rash.  Neurological:  Negative for dizziness, tremors, speech difficulty and weakness.  Psychiatric/Behavioral:  Negative for agitation, dysphoric mood and sleep disturbance. The patient is not nervous/anxious.     Objective:  BP 114/72   Pulse 84   Temp 97.8 F (36.6 C)   Ht 5' 6 (1.676 m)   Wt 244 lb 3.2 oz (110.8 kg)   SpO2 96%   BMI 39.41 kg/m   BP Readings from Last 3 Encounters:  09/01/24 114/72  06/30/24 129/83  01/10/24 120/84    Wt Readings from Last 3 Encounters:  09/01/24 244 lb 3.2 oz (110.8 kg)  06/30/24 260 lb (117.9 kg)  01/10/24 255 lb (115.7 kg)    Physical Exam Constitutional:      General: He is not in acute distress.    Appearance: He is well-developed. He is obese.     Comments: NAD  Eyes:     Conjunctiva/sclera: Conjunctivae normal.     Pupils: Pupils are equal, round, and reactive to light.  Neck:     Thyroid : No thyromegaly.     Vascular: No JVD.  Cardiovascular:     Rate and Rhythm: Normal rate and regular rhythm.     Heart sounds: Normal heart sounds. No murmur heard.    No  friction rub. No gallop.  Pulmonary:     Effort: Pulmonary effort is normal. No respiratory distress.     Breath sounds: Normal breath sounds. No wheezing or rales.  Chest:     Chest wall: No tenderness.  Abdominal:     General: Bowel sounds are normal. There is no distension.     Palpations: Abdomen is soft. There is no mass.     Tenderness: There is no abdominal tenderness. There is no guarding or rebound.  Musculoskeletal:        General: No tenderness. Normal range of motion.     Cervical back: Normal range of motion.  Lymphadenopathy:     Cervical: No cervical adenopathy.  Skin:    General: Skin is warm and dry.     Findings: No  rash.  Neurological:     Mental Status: He is alert and oriented to person, place, and time.     Cranial Nerves: No cranial nerve deficit.     Motor: No abnormal muscle tone.     Coordination: Coordination normal.     Gait: Gait normal.     Deep Tendon Reflexes: Reflexes are normal and symmetric.  Psychiatric:        Behavior: Behavior normal.        Thought Content: Thought content normal.        Judgment: Judgment normal.     Lab Results  Component Value Date   WBC 8.8 07/01/2024   HGB 14.9 07/01/2024   HCT 45.6 07/01/2024   PLT 221.0 07/01/2024   GLUCOSE 128 (H) 07/01/2024   CHOL 195 05/05/2021   TRIG 154.0 (H) 05/05/2021   HDL 47.60 05/05/2021   LDLCALC 117 (H) 05/05/2021   ALT 37 07/01/2024   AST 39 (H) 07/01/2024   NA 138 07/01/2024   K 4.0 07/01/2024   CL 102 07/01/2024   CREATININE 0.96 07/01/2024   BUN 20 07/01/2024   CO2 27 07/01/2024   TSH 1.26 07/01/2024   PSA 0.54 01/11/2022   HGBA1C 6.5 07/01/2024    No results found.  Assessment & Plan:   Problem List Items Addressed This Visit     Abdominal pain   Abd pain is much better after wt loss: abdominal pain is likely related to panniculitis (inflammation in the fat tissue).      Lumbar radiculopathy   Cont w/wt loss      Relevant Medications   phentermine  (ADIPEX-P ) 37.5 MG tablet   Obesity (BMI 30-39.9)   Pt lost 16 lbs in 2 months on Phentermine  Potential benefits of a long term amphetamines  use as well as potential risks  and complications were explained to the patient and were aknowledged. Abd pain is much better after wt loss: abdominal pain is likely related to panniculitis (inflammation in the fat tissue).      Relevant Medications   phentermine  (ADIPEX-P ) 37.5 MG tablet   Sinusitis, chronic   Doing well      Other Visit Diagnoses       Immunization due    -  Primary   Relevant Orders   Flu vaccine HIGH DOSE PF(Fluzone Trivalent) (Completed)         Meds ordered this  encounter  Medications   phentermine  (ADIPEX-P ) 37.5 MG tablet    Sig: Take 1 tablet (37.5 mg total) by mouth daily before breakfast.    Dispense:  30 tablet    Refill:  2      Follow-up: No follow-ups on file.  Marolyn Noel, MD

## 2024-11-23 ENCOUNTER — Ambulatory Visit: Admitting: Family Medicine

## 2024-11-23 ENCOUNTER — Encounter: Payer: Self-pay | Admitting: Family Medicine

## 2024-11-23 VITALS — BP 130/82 | HR 80 | Temp 98.1°F | Resp 18 | Ht 66.0 in | Wt 225.0 lb

## 2024-11-23 DIAGNOSIS — R062 Wheezing: Secondary | ICD-10-CM | POA: Diagnosis not present

## 2024-11-23 DIAGNOSIS — J069 Acute upper respiratory infection, unspecified: Secondary | ICD-10-CM

## 2024-11-23 MED ORDER — BENZONATATE 200 MG PO CAPS: 200.0000 mg | ORAL_CAPSULE | Freq: Three times a day (TID) | ORAL | 0 refills | Status: AC | PRN

## 2024-11-23 MED ORDER — ALBUTEROL SULFATE HFA 108 (90 BASE) MCG/ACT IN AERS: 2.0000 | INHALATION_SPRAY | Freq: Four times a day (QID) | RESPIRATORY_TRACT | 0 refills | Status: AC | PRN

## 2024-11-23 MED ORDER — IPRATROPIUM-ALBUTEROL 0.5-2.5 (3) MG/3ML IN SOLN
3.0000 mL | Freq: Once | RESPIRATORY_TRACT | Status: AC
  Administered 2024-11-23: 3 mL via RESPIRATORY_TRACT

## 2024-11-23 NOTE — Progress Notes (Signed)
 "  Assessment & Plan Viral URI Acute upper respiratory infection with wheezing Symptoms consistent with viral etiology, likely common cold. Wheezing suggests possible bronchospasm. Oxygen saturation improved post-breathing treatment.  - Administered breathing treatment. - Prescribed inhaler twice every 4-6 hours as needed. - Prescribed cough medicine. - Recommended OTC Sudafed for decongestion. - Advised continuation of current antihistamine. - Patient educated on the proper technique of the Albuterol  inhaler; patient verbalized understanding.  Orders:   albuterol  (VENTOLIN  HFA) 108 (90 Base) MCG/ACT inhaler; Inhale 2 puffs into the lungs every 6 (six) hours as needed.   benzonatate  (TESSALON ) 200 MG capsule; Take 1 capsule (200 mg total) by mouth 3 (three) times daily as needed for cough.  Wheezing Wheezing suggests possible bronchospasm. Oxygen saturation improved post-breathing treatment.  - Administered breathing treatment. - Prescribed inhaler twice every 4-6 hours as needed. Orders:   ipratropium-albuterol  (DUONEB) 0.5-2.5 (3) MG/3ML nebulizer solution 3 mL    Follow up plan: Return if symptoms worsen or fail to improve.  Niki Rung, MSN, APRN, FNP-C  Subjective:  HPI: Joe Hawkins is a 64 y.o. male presenting on 11/23/2024 for Cough (Runny nose, cough, congestion - dark yellow, no fever /Started 5 days ago )  Discussed the use of AI scribe software for clinical note transcription with the patient, who gave verbal consent to proceed.  He has been experiencing a cough, runny nose, and congestion for five days. The congestion is present in both his head and chest. The cough worsens at night and feels like 'dry heaving'. While the runny nose has slightly improved, the cough remains consistent.  No fever or sore throat. He reports body aches and a feeling of tightness in his chest, making it harder to breathe.  He has not taken any medications for these symptoms except for a  nasal spray, specifically Flonase , which has helped with the runny nose but not the cough.  He has no history of asthma or COPD and does not smoke.       ROS: Negative unless specifically indicated above in HPI.   Relevant past medical history reviewed and updated as indicated.   Allergies and medications reviewed and updated.  Current Medications[1]  Allergies[2]  Objective:   BP 130/82   Pulse 80   Temp 98.1 F (36.7 C)   Resp 18   Ht 5' 6 (1.676 m)   Wt 225 lb (102.1 kg)   SpO2 96% Comment: after neb  BMI 36.32 kg/m    Physical Exam Vitals reviewed.  Constitutional:      General: He is not in acute distress.    Appearance: Normal appearance. He is not ill-appearing, toxic-appearing or diaphoretic.  HENT:     Head: Normocephalic and atraumatic.     Right Ear: Tympanic membrane, ear canal and external ear normal. There is no impacted cerumen.     Left Ear: Tympanic membrane, ear canal and external ear normal. There is no impacted cerumen.     Nose: Congestion present.     Right Sinus: No maxillary sinus tenderness or frontal sinus tenderness.     Left Sinus: No maxillary sinus tenderness or frontal sinus tenderness.     Mouth/Throat:     Mouth: Mucous membranes are moist.     Pharynx: Oropharynx is clear. No oropharyngeal exudate or posterior oropharyngeal erythema.     Tonsils: No tonsillar exudate or tonsillar abscesses.  Eyes:     General: No scleral icterus.       Right eye: No discharge.  Left eye: No discharge.     Conjunctiva/sclera: Conjunctivae normal.  Cardiovascular:     Rate and Rhythm: Normal rate.  Pulmonary:     Effort: Pulmonary effort is normal. No respiratory distress.     Breath sounds: Wheezing and rhonchi present.  Musculoskeletal:        General: Normal range of motion.     Cervical back: Normal range of motion.  Lymphadenopathy:     Cervical: No cervical adenopathy.  Skin:    General: Skin is warm and dry.  Neurological:      Mental Status: He is alert and oriented to person, place, and time. Mental status is at baseline.  Psychiatric:        Mood and Affect: Mood normal.        Behavior: Behavior normal.        Thought Content: Thought content normal.        Judgment: Judgment normal.            [1]  Current Outpatient Medications:    acetaminophen  (TYLENOL ) 500 MG tablet, Take 500-1,000 mg by mouth as needed (pain.)., Disp: , Rfl:    albuterol  (VENTOLIN  HFA) 108 (90 Base) MCG/ACT inhaler, Inhale 2 puffs into the lungs every 6 (six) hours as needed., Disp: 18 g, Rfl: 0   benzonatate  (TESSALON ) 200 MG capsule, Take 1 capsule (200 mg total) by mouth 3 (three) times daily as needed for cough., Disp: 30 capsule, Rfl: 0   fluticasone  (FLONASE ) 50 MCG/ACT nasal spray, Place 2 sprays into both nostrils daily., Disp: 48 g, Rfl: 3   levocetirizine (XYZAL ) 5 MG tablet, Take 1 tablet (5 mg total) by mouth daily as needed for allergies., Disp: 90 tablet, Rfl: 3   mometasone (ELOCON) 0.1 % cream, Apply topically., Disp: , Rfl:    Polyethyl Glycol-Propyl Glycol (SYSTANE) 0.4-0.3 % SOLN, Place 1-2 drops into both eyes 3 (three) times daily as needed (dry/irritated eyes.)., Disp: , Rfl:    Trolamine Salicylate (BLUE-EMU MAXIMUM PAIN RELIEF EX), Apply 1 Application topically 3 (three) times daily as needed (pain.)., Disp: , Rfl:    EPINEPHrine  (EPIPEN ) 0.3 mg/0.3 mL DEVI, Inject 0.3 mLs (0.3 mg total) into the muscle once. (Patient not taking: Reported on 11/23/2024), Disp: 1 Device, Rfl: 0   meloxicam  (MOBIC ) 15 MG tablet, TAKE 1 TABLET (15 MG TOTAL) BY MOUTH DAILY. (Patient not taking: Reported on 11/23/2024), Disp: 30 tablet, Rfl: 1 [2]  Allergies Allergen Reactions   Shellfish Allergy Itching    Crab, lobsters and shrimp.    "

## 2024-11-23 NOTE — Patient Instructions (Addendum)
 Pick up some Sudafed

## 2024-12-08 ENCOUNTER — Encounter: Payer: Self-pay | Admitting: Internal Medicine

## 2024-12-08 ENCOUNTER — Ambulatory Visit: Admitting: Internal Medicine

## 2024-12-08 VITALS — BP 128/76 | HR 77 | Ht 66.0 in | Wt 240.6 lb

## 2024-12-08 DIAGNOSIS — Z6841 Body Mass Index (BMI) 40.0 and over, adult: Secondary | ICD-10-CM

## 2024-12-08 DIAGNOSIS — G4733 Obstructive sleep apnea (adult) (pediatric): Secondary | ICD-10-CM | POA: Insufficient documentation

## 2024-12-08 DIAGNOSIS — I4819 Other persistent atrial fibrillation: Secondary | ICD-10-CM | POA: Diagnosis not present

## 2024-12-08 DIAGNOSIS — Z789 Other specified health status: Secondary | ICD-10-CM | POA: Diagnosis not present

## 2024-12-08 MED ORDER — PHENTERMINE HCL 37.5 MG PO TABS: 37.5000 mg | ORAL_TABLET | Freq: Every day | ORAL | 2 refills | Status: AC

## 2024-12-08 NOTE — Assessment & Plan Note (Addendum)
 Poor tolerance of CPAP - Pulm ref On Eliquis 

## 2024-12-08 NOTE — Assessment & Plan Note (Addendum)
 Wt Readings from Last 3 Encounters:  12/08/24 240 lb 9.6 oz (109.1 kg)  11/23/24 225 lb (102.1 kg)  09/01/24 244 lb 3.2 oz (110.8 kg)  On diet Phentermine  Rx  Potential benefits of a long term phentermine   use as well as potential risks  and complications were explained to the patient and were aknowledged. D/c if side effects

## 2024-12-08 NOTE — Progress Notes (Signed)
 "  Subjective:  Patient ID: Joe Hawkins, male    DOB: [birth year]  Age: 64 y.o. MRN: [id number]  CC: Snoring (Patient still snores at night even with use of CPAP. Wants to know if they should continue use?[employer] and insurance concerns). CPAP originally prescribed by cardiologist but patient has not since followed up with them)   HPI Joe Hawkins presents for OSA - on CPAP since 2024: Severe Obstructive Sleep Apnea  on a home sleep test. C/o poor tolerance of CPAP - stopped using...  Outpatient Medications Prior to Visit  Medication Sig Dispense Refill   acetaminophen  (TYLENOL ) 500 MG tablet Take 500-1,000 mg by mouth as needed (pain.).     albuterol  (VENTOLIN  HFA) 108 (90 Base) MCG/ACT inhaler Inhale 2 puffs into the lungs every 6 (six) hours as needed. 18 g 0   benzonatate  (TESSALON ) 200 MG capsule Take 1 capsule (200 mg total) by mouth 3 (three) times daily as needed for cough. 30 capsule 0   fluticasone  (FLONASE ) 50 MCG/ACT nasal spray Place 2 sprays into both nostrils daily. 48 g 3   levocetirizine (XYZAL ) 5 MG tablet Take 1 tablet (5 mg total) by mouth daily as needed for allergies. 90 tablet 3   mometasone (ELOCON) 0.1 % cream Apply topically.     Polyethyl Glycol-Propyl Glycol (SYSTANE) 0.4-0.3 % SOLN Place 1-2 drops into both eyes 3 (three) times daily as needed (dry/irritated eyes.).     Trolamine Salicylate (BLUE-EMU MAXIMUM PAIN RELIEF EX) Apply 1 Application topically 3 (three) times daily as needed (pain.).     EPINEPHrine  (EPIPEN ) 0.3 mg/0.3 mL DEVI Inject 0.3 mLs (0.3 mg total) into the muscle once. (Patient not taking: Reported on 12/08/2024) 1 Device 0   meloxicam  (MOBIC ) 15 MG tablet TAKE 1 TABLET (15 MG TOTAL) BY MOUTH DAILY. (Patient not taking: Reported on 12/08/2024) 30 tablet 1   No facility-administered medications prior to visit.    ROS: Review of Systems  Constitutional:  Positive for unexpected weight change. Negative for appetite change and fatigue.  HENT:   Negative for congestion, nosebleeds, sneezing, sore throat and trouble swallowing.   Eyes:  Negative for itching and visual disturbance.  Respiratory:  Negative for cough.   Cardiovascular:  Negative for chest pain, palpitations and leg swelling.  Gastrointestinal:  Negative for abdominal distention, blood in stool, diarrhea and nausea.  Genitourinary:  Negative for frequency and hematuria.  Musculoskeletal:  Negative for back pain, gait problem, joint swelling and neck pain.  Skin:  Negative for rash.  Neurological:  Negative for dizziness, tremors, speech difficulty and weakness.  Psychiatric/Behavioral:  Negative for agitation, dysphoric mood, sleep disturbance and suicidal ideas. The patient is not nervous/anxious.     Objective:  BP 128/76   Pulse 77   Ht 5' 6 (1.676 m)   Wt 240 lb 9.6 oz (109.1 kg)   SpO2 99%   BMI 38.83 kg/m   BP Readings from Last 3 Encounters:  12/08/24 128/76  11/23/24 130/82  09/01/24 114/72    Wt Readings from Last 3 Encounters:  12/08/24 240 lb 9.6 oz (109.1 kg)  11/23/24 225 lb (102.1 kg)  09/01/24 244 lb 3.2 oz (110.8 kg)    Physical Exam Constitutional:      General: He is not in acute distress.    Appearance: He is well-developed. He is obese.     Comments: NAD  Eyes:     Conjunctiva/sclera: Conjunctivae normal.     Pupils: Pupils are equal, round, and reactive to  light.  Neck:     Thyroid : No thyromegaly.     Vascular: No JVD.  Cardiovascular:     Rate and Rhythm: Normal rate and regular rhythm.     Heart sounds: Normal heart sounds. No murmur heard.    No friction rub. No gallop.  Pulmonary:     Effort: Pulmonary effort is normal. No respiratory distress.     Breath sounds: Normal breath sounds. No wheezing or rales.  Chest:     Chest wall: No tenderness.  Abdominal:     General: Bowel sounds are normal. There is no distension.     Palpations: Abdomen is soft. There is no mass.     Tenderness: There is no abdominal  tenderness. There is no guarding or rebound.  Musculoskeletal:        General: No tenderness. Normal range of motion.     Cervical back: Normal range of motion.  Lymphadenopathy:     Cervical: No cervical adenopathy.  Skin:    General: Skin is warm and dry.     Findings: No rash.  Neurological:     Mental Status: He is alert and oriented to person, place, and time.     Cranial Nerves: No cranial nerve deficit.     Motor: No abnormal muscle tone.     Coordination: Coordination normal.     Gait: Gait normal.     Deep Tendon Reflexes: Reflexes are normal and symmetric.  Psychiatric:        Behavior: Behavior normal.        Thought Content: Thought content normal.        Judgment: Judgment normal.     Lab Results  Component Value Date   WBC 8.8 07/01/2024   HGB 14.9 07/01/2024   HCT 45.6 07/01/2024   PLT 221.0 07/01/2024   GLUCOSE 128 (H) 07/01/2024   CHOL 195 05/05/2021   TRIG 154.0 (H) 05/05/2021   HDL 47.60 05/05/2021   LDLCALC 117 (H) 05/05/2021   ALT 37 07/01/2024   AST 39 (H) 07/01/2024   NA 138 07/01/2024   K 4.0 07/01/2024   CL 102 07/01/2024   CREATININE 0.96 07/01/2024   BUN 20 07/01/2024   CO2 27 07/01/2024   TSH 1.26 07/01/2024   PSA 0.54 01/11/2022   HGBA1C 6.5 07/01/2024    No results found.  Assessment & Plan:   Problem List Items Addressed This Visit     Atrial fibrillation (HCC)   Poor tolerance of CPAP - Pulm ref On Eliquis       Morbid obesity with BMI of 45.0-49.9, adult (HCC)   Wt Readings from Last 3 Encounters:  12/08/24 240 lb 9.6 oz (109.1 kg)  11/23/24 225 lb (102.1 kg)  09/01/24 244 lb 3.2 oz (110.8 kg)  On diet Phentermine  Rx  Potential benefits of a long term phentermine   use as well as potential risks  and complications were explained to the patient and were aknowledged. D/c if side effects       Relevant Medications   phentermine  (ADIPEX-P ) 37.5 MG tablet   Other Relevant Orders   Ambulatory referral to Pulmonology    OSA on CPAP - Primary   OSA - on CPAP since 2024: Severe Obstructive Sleep Apnea  on a home sleep test. C/o poor tolerance of CPAP - stopped using.. ([facility] provided CPAP). Poor tolerance of CPAP Pulm ref - Dr Jude      Relevant Orders   Ambulatory referral to Pulmonology   Intolerance of  continuous positive airway pressure (CPAP) ventilation    OSA - on CPAP since 2024: Severe Obstructive Sleep Apnea  on a home sleep test. C/o poor tolerance of CPAP - stopped using.S[facility] GENTRY provided CPAP) Pulm ref - Dr Jude      Relevant Orders   Ambulatory referral to Pulmonology      Meds ordered this encounter  Medications   phentermine  (ADIPEX-P ) 37.5 MG tablet    Sig: Take 1 tablet (37.5 mg total) by mouth daily before breakfast.    Dispense:  30 tablet    Refill:  2      Follow-up: No follow-ups on file.  Marolyn Noel, MD "

## 2024-12-08 NOTE — Patient Instructions (Addendum)
 Inspire Upper Airway Stimulation for Sleep Apnea Inspire Upper Airway Stimulation for sleep apnea is a powerful and exciting treatment for many patients who do not succeed with positive airway pressure therapy (for example, CPAP or BPAP).  Inspire Upper Airway Stimulation for sleep apnea is a system that involves a surgical procedure to implant 3 components.  These components work together to deliver stimulation to the nerve that controls tongue movement (hypoglossal nerve) during sleep.        [employer] has recently reduced the self-pay cash prices for Zepbound single-dose vials purchased through the [employer] online pharmacy   The new prices for a four-week supply (four single-dose vials) for self-paying patients are:   2.5 mg dose: $299 per month (previously $349) 5 mg dose: $399 per month (previously $499) 7.5 mg, 10 mg, 12.5 mg, and 15 mg doses: $449 per month (previously $499)

## 2024-12-08 NOTE — Assessment & Plan Note (Addendum)
" °  OSA - on CPAP since 2024: Severe Obstructive Sleep Apnea  on a home sleep test. C/o poor tolerance of CPAP - stopped using.[facility] GENTRY provided CPAP) Pulm ref - Dr Jude "

## 2024-12-08 NOTE — Assessment & Plan Note (Addendum)
 OSA - on CPAP since 2024: Severe Obstructive Sleep Apnea  on a home sleep test. C/o poor tolerance of CPAP - stopped using.. ([facility] provided CPAP). Poor tolerance of CPAP Pulm ref - Dr Jude

## 2024-12-09 ENCOUNTER — Telehealth: Payer: Self-pay

## 2024-12-09 NOTE — Telephone Encounter (Signed)
 Copied from CRM [id number]. Topic: Clinical - Prescription Issue >> Dec 09, 2024 10:55 AM Robinson DEL wrote: Reason for CRM: Patients friend Consuello calling stating they went to pharmacy to pick up Zepbound medication and pharmacy states they don't have medication for patient. Medication not showing on medication list for patient to give status.   Consuello [phone] Patient doesn't understand English

## 2024-12-11 NOTE — Telephone Encounter (Addendum)
 I sent phentermine  prescription to the pharmacy.  Zepbound was $299 cash price per month and he did not want it for this price, as I understood.  We had an interpreter with us .  Thank you

## 2025-01-08 ENCOUNTER — Ambulatory Visit (HOSPITAL_BASED_OUTPATIENT_CLINIC_OR_DEPARTMENT_OTHER): Admitting: Pulmonary Disease
